# Patient Record
Sex: Female | Born: 1975 | Race: White | Hispanic: No | Marital: Married | Smoking: Never smoker
Health system: Southern US, Community
[De-identification: ages and names within clinical notes are randomized; demographics above are authoritative.]

## PROBLEM LIST (undated history)

## (undated) DIAGNOSIS — K8020 Calculus of gallbladder without cholecystitis without obstruction: Secondary | ICD-10-CM

## (undated) DIAGNOSIS — D649 Anemia, unspecified: Secondary | ICD-10-CM

## (undated) DIAGNOSIS — Z87442 Personal history of urinary calculi: Secondary | ICD-10-CM

## (undated) DIAGNOSIS — N8502 Endometrial intraepithelial neoplasia [EIN]: Secondary | ICD-10-CM

## (undated) DIAGNOSIS — R600 Localized edema: Secondary | ICD-10-CM

## (undated) DIAGNOSIS — F419 Anxiety disorder, unspecified: Secondary | ICD-10-CM

## (undated) DIAGNOSIS — M549 Dorsalgia, unspecified: Secondary | ICD-10-CM

## (undated) DIAGNOSIS — N390 Urinary tract infection, site not specified: Secondary | ICD-10-CM

## (undated) DIAGNOSIS — E669 Obesity, unspecified: Secondary | ICD-10-CM

## (undated) DIAGNOSIS — M2550 Pain in unspecified joint: Secondary | ICD-10-CM

## (undated) DIAGNOSIS — K219 Gastro-esophageal reflux disease without esophagitis: Secondary | ICD-10-CM

## (undated) DIAGNOSIS — F411 Generalized anxiety disorder: Secondary | ICD-10-CM

## (undated) DIAGNOSIS — F329 Major depressive disorder, single episode, unspecified: Secondary | ICD-10-CM

## (undated) DIAGNOSIS — I10 Essential (primary) hypertension: Secondary | ICD-10-CM

## (undated) DIAGNOSIS — G5600 Carpal tunnel syndrome, unspecified upper limb: Secondary | ICD-10-CM

## (undated) DIAGNOSIS — K5790 Diverticulosis of intestine, part unspecified, without perforation or abscess without bleeding: Secondary | ICD-10-CM

## (undated) DIAGNOSIS — F32A Depression, unspecified: Secondary | ICD-10-CM

## (undated) DIAGNOSIS — C541 Malignant neoplasm of endometrium: Secondary | ICD-10-CM

## (undated) DIAGNOSIS — G43909 Migraine, unspecified, not intractable, without status migrainosus: Secondary | ICD-10-CM

## (undated) DIAGNOSIS — K829 Disease of gallbladder, unspecified: Secondary | ICD-10-CM

## (undated) HISTORY — DX: Anemia, unspecified: D64.9

## (undated) HISTORY — DX: Anxiety disorder, unspecified: F41.9

## (undated) HISTORY — DX: Dorsalgia, unspecified: M54.9

## (undated) HISTORY — PX: CHOLECYSTECTOMY: SHX55

## (undated) HISTORY — DX: Malignant neoplasm of endometrium: C54.1

## (undated) HISTORY — DX: Urinary tract infection, site not specified: N39.0

## (undated) HISTORY — DX: Essential (primary) hypertension: I10

## (undated) HISTORY — DX: Pain in unspecified joint: M25.50

## (undated) HISTORY — DX: Migraine, unspecified, not intractable, without status migrainosus: G43.909

## (undated) HISTORY — DX: Disease of gallbladder, unspecified: K82.9

## (undated) HISTORY — PX: CARPAL TUNNEL RELEASE: SHX101

## (undated) HISTORY — DX: Calculus of gallbladder without cholecystitis without obstruction: K80.20

## (undated) HISTORY — DX: Carpal tunnel syndrome, unspecified upper limb: G56.00

## (undated) HISTORY — DX: Obesity, unspecified: E66.9

## (undated) HISTORY — DX: Localized edema: R60.0

---
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
## 2019-05-05 HISTORY — PX: ENDOMETRIAL BIOPSY: SHX622

## 2019-06-05 ENCOUNTER — Telehealth: Payer: Self-pay | Admitting: *Deleted

## 2019-06-05 NOTE — Telephone Encounter (Signed)
Patient called and scheduled an appt for new patient slot on 1/27

## 2019-06-08 NOTE — H&P (View-Only) (Signed)
GYNECOLOGIC ONCOLOGY NEW PATIENT CONSULTATION   Patient Name: Amber Cobb  Patient Age: 44 y.o. Date of Service: 06/10/19 Referring Provider: No referring provider defined for this encounter.   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    Patient desires to proceed with surgical management.  She has completed childbearing and is a suitable candidate for hysterectomy via a minimally invasive approach to surgery.  Given that she is premenopausal, bilateral salpingectomy is recommended.  Given her age and premenopausal status, we discussed the risk of ovarian removal.  As long as ovaries are normal-appearing at the time of surgery, I recommend leaving them in situ.  We reviewed that robotic assistance would be used to complete the surgery.  We discussed that endometrial cancer is detected in about 40% of final uterine pathology specimens from patients with CAH.    We discussed that the standard of care for [facility] is still total hysterectomy with intraoperative frozen pathologic review of the uterus to determine need for additional procedures such as lymph node evaluation.  I reviewed that I offer patients the option to proceed with sentinel lymph node biopsy in the event that a malignancy is diagnosed.  We discussed the increased risk associated with sentinel lymph node biopsy including damage to blood vessels and nerves.  The patient is agreeable to proceeding with plan for sentinel lymph node biopsy.  She understands that if there is failure to map on 1 or both sides, then we would plan to send the uterus for intraoperative frozen section to help determine the need for a full pelvic lymph node removal.  We reviewed the sentinel lymph node technique. Risks and benefits of  sentinel lymph node biopsy was reviewed. We reviewed the technique and ICG dye. The patient DOES NOT have an iodine allergy or known liver dysfunction. We reviewed the false negative rate (0.4%), and that 3% of patients with metastatic disease will not have it detected by SLN biopsy in endometrial cancer. A low risk of allergic reaction to the dye, <0.2% for ICG, has been reported. We also discussed that in the case of failed mapping, which occurs 40% of the time, a bilateral or unilateral lymphadenectomy will be performed at the surgeon's discretion.   Potential benefits of sentinel nodes including a higher detection rate for metastasis due to ultrastaging and potential reduction in operative morbidity. However, there remains uncertainty as to the role for treatment of micrometastatic disease. Further, the benefit of operative morbidity associated with the SLN technique in endometrial cancer is not yet completely known. In other patient populations (e.g. the cervical cancer population) there has been observed reductions in morbidity with SLN biopsy compared to pelvic lymphadenectomy. Lymphedema, nerve dysfunction and lymphocysts are all potential risks with the SLN technique as with complete lymphadenectomy. Additional risks to the patient include the risk of damage to an internal organ while operating in an altered view (e.g. the black and white image of the robotic fluorescence imaging mode).   The patient was consented for a robotic assisted hysterectomy, bilateral salpingectomy, sentinel lymph node evaluation, possible lymph node dissection, possible laparotomy. The risks of surgery were discussed in detail and she understands these to include infection; wound separation; hernia; vaginal cuff separation, injury to adjacent organs such as bowel, bladder, blood vessels, ureters and nerves; bleeding which may require  blood transfusion; anesthesia risk; thromboembolic events; possible death; unforeseen  complications; possible need for re-exploration; medical complications such as heart attack, stroke, pleural effusion and pneumonia; and, if full lymphadenectomy is performed the risk of lymphedema and lymphocyst. The patient will receive DVT and antibiotic prophylaxis as indicated. She voiced a clear understanding. She had the opportunity to ask questions. Perioperative instructions were reviewed with her. Prescriptions for post-op medications were sent to her pharmacy of choice.  A copy of this note was sent to the patient's referring provider.   52 minutes of total time was spent for this patient encounter, including preparation, face-to-face counseling with the patient and coordination of care, and documentation of the encounter.  Jeral Pinch, MD  Division of Gynecologic Oncology  Department of Obstetrics and Gynecology  [facility]  ___________________________________________  Chief Complaint: Chief Complaint  Patient presents with  . Complex atypical endometrial hyperplasia    History of Present Illness:  Amber Cobb is a 44 y.o. y.o. female who is seen in consultation at the request of No ref. provider found for an evaluation of EIN.  Patient reports a longstanding history of heavy and irregular menses.  She presented on 12/22 with reported abnormal uterine bleeding.  She had been seen previously in June 2020 with similar symptoms.  At that time her TSH and [facility] were normal.  Endometrial biopsy had been recommended as well as hormonal regulation, both declined.  Pelvic ultrasound at that time showed a uterus measuring 9.2 x 6.2 x 6.9 cm with endometrial lining measuring 22 mm.  Small, 3 cm complex cyst of the ovary also noted.  In December, patient underwent endometrial biopsy with uterus sounding to 8 cm and final pathology revealing at least complex atypical hyperplasia.  She notes that her menses several months ago lasted for 2 months.  Her most recent  menses stopped the day before her CT scan and she started bleeding again a week ago.  She endorses having a good appetite without nausea or vomiting.  She notes normal bowel and bladder function.  She denies any chest pain or shortness of breath at rest or with exertion.  She presents with her husband today.  She is scheduled for carpal tunnel surgery on 2/19.  PAST MEDICAL HISTORY:  Past Medical History:  Diagnosis Date  . Anemia   . Hypertension   . Migraines      PAST SURGICAL HISTORY:  Past Surgical History:  Procedure Laterality Date  . CHOLECYSTECTOMY    . ENDOMETRIAL BIOPSY  05/05/2019    OB/GYN HISTORY:  OB History  Gravida Para Term Preterm AB Living  2 2          SAB TAB Ectopic Multiple Live Births               # Outcome Date GA Lbr Len/2nd Weight Sex Delivery Anes PTL Lv  2 Para           1 Para             No LMP recorded.  Age at menarche: 41  Age at menopause: n/a Hx of HRT: no Hx of STDs: no Last pap: 10/2018 History of abnormal pap smears: no  SCREENING STUDIES:  Last mammogram: n/a  Last colonoscopy: n/a Last bone mineral density: n/a  MEDICATIONS: Outpatient Encounter Medications as of 06/10/2019  Medication Sig  . amLODipine (NORVASC) 5 MG tablet Take 5 mg by mouth daily.  . butalbital-acetaminophen-caffeine (FIORICET) 50-325-40 MG tablet Take  1 tablet by mouth every 6 (six) hours as needed.  Marland Kitchen escitalopram (LEXAPRO) 10 MG tablet Take 10 mg by mouth daily.  . FEROSUL 325 (65 Fe) MG tablet Take 325 mg by mouth daily.  Marland Kitchen loratadine (CLARITIN) 10 MG tablet Take 10 mg by mouth daily.  Marland Kitchen losartan (COZAAR) 100 MG tablet Take 100 mg by mouth daily.  Marland Kitchen omeprazole (PRILOSEC) 40 MG capsule Take 40 mg by mouth daily as needed.   No facility-administered encounter medications on file as of 06/10/2019.    ALLERGIES:  No Known Allergies   FAMILY HISTORY:  Family History  Problem Relation Age of Onset  . Hypertension Mother   . Diabetes Mother    . COPD Mother   . Thyroid disease Mother   . Thyroid disease Sister   . Thyroid disease Brother   . Throat cancer Maternal Grandfather   . Endometrial cancer Maternal Aunt      SOCIAL HISTORY:    Social Connections:   . Frequency of Communication with Friends and Family: Not on file  . Frequency of Social Gatherings with Friends and Family: Not on file  . Attends Religious Services: Not on file  . Active Member of Clubs or Organizations: Not on file  . Attends Archivist Meetings: Not on file  . Marital Status: Not on file    REVIEW OF SYSTEMS:  + vaginal bleeding Denies appetite changes, fevers, chills, fatigue, unexplained weight changes. Denies hearing loss, neck lumps or masses, mouth sores, ringing in ears or voice changes. Denies cough or wheezing.  Denies shortness of breath. Denies chest pain or palpitations. Denies leg swelling. Denies abdominal distention, pain, blood in stools, constipation, diarrhea, nausea, vomiting, or early satiety. Denies pain with intercourse, dysuria, frequency, hematuria or incontinence. Denies hot flashes, pelvic pain, vaginal discharge.   Denies joint pain, back pain or muscle pain/cramps. Denies itching, rash, or wounds. Denies dizziness, headaches, numbness or seizures. Denies swollen lymph nodes or glands, denies easy bruising or bleeding. Denies anxiety, depression, confusion, or decreased concentration.  Physical Exam:  Vital Signs for this encounter:  Blood pressure 119/86, pulse (!) 58, temperature 98 F (36.7 C), temperature source Temporal, resp. rate 16, height 5\' 6"  (1.676 m), weight 250 lb (113.4 kg), SpO2 100 %. Body mass index is 40.35 kg/m. General: Alert, oriented, no acute distress.  HEENT: Normocephalic, atraumatic. Sclera anicteric.  Chest: Clear to auscultation bilaterally. No wheezes, rhonchi, or rales. Cardiovascular: Regular rate and rhythm, no murmurs, rubs, or gallops.  Abdomen: Obese. Normoactive  bowel sounds. Soft, nondistended, nontender to palpation. No masses or hepatosplenomegaly appreciated. No palpable fluid wave.  Extremities: Grossly normal range of motion. Warm, well perfused. No edema bilaterally.  Skin: No rashes or lesions.  Lymphatics: No cervical, supraclavicular, or inguinal adenopathy.  GU:  Normal external female genitalia.  No lesions. No discharge or bleeding.             Bladder/urethra:  No lesions or masses, well supported bladder             Vagina: Well rugated vaginal mucosa.             Cervix: Normal appearing, no lesions.             Uterus: 8-10cm, mobile, no parametrial involvement or nodularity.             Adnexa: No masses.  Rectal: Deferred.  LABORATORY AND RADIOLOGIC DATA:  Outside medical records were reviewed to synthesize the above  history, along with the history and physical obtained during the visit.   Endometrial biopsy on 12/23: Complex atypical hyperplasia, at least.  Cannot exclude endometrial adenocarcinoma.  CT abdomen/pelvis on 05/21/2019: Endometrial thickening, compatible with reported history of endometrial cancer.  No evidence of metastatic disease in the abdomen or pelvis.  7 mm hypodensity in the uncinate process, indeterminate and likely benign.  MRI of the abdomen with and without contrast could be used to further evaluate.

## 2019-06-08 NOTE — Progress Notes (Signed)
GYNECOLOGIC ONCOLOGY NEW PATIENT CONSULTATION   Patient Name: Amber Cobb  Patient Age: 44 y.o. Date of Service: 06/10/19 Referring Provider: No referring provider defined for this encounter.   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    Patient desires to proceed with surgical management.  She has completed childbearing and is a suitable candidate for hysterectomy via a minimally invasive approach to surgery.  Given that she is premenopausal, bilateral salpingectomy is recommended.  Given her age and premenopausal status, we discussed the risk of ovarian removal.  As long as ovaries are normal-appearing at the time of surgery, I recommend leaving them in situ.  We reviewed that robotic assistance would be used to complete the surgery.  We discussed that endometrial cancer is detected in about 40% of final uterine pathology specimens from patients with CAH.    We discussed that the standard of care for [facility] is still total hysterectomy with intraoperative frozen pathologic review of the uterus to determine need for additional procedures such as lymph node evaluation.  I reviewed that I offer patients the option to proceed with sentinel lymph node biopsy in the event that a malignancy is diagnosed.  We discussed the increased risk associated with sentinel lymph node biopsy including damage to blood vessels and nerves.  The patient is agreeable to proceeding with plan for sentinel lymph node biopsy.  She understands that if there is failure to map on 1 or both sides, then we would plan to send the uterus for intraoperative frozen section to help determine the need for a full pelvic lymph node removal.  We reviewed the sentinel lymph node technique. Risks and benefits of  sentinel lymph node biopsy was reviewed. We reviewed the technique and ICG dye. The patient DOES NOT have an iodine allergy or known liver dysfunction. We reviewed the false negative rate (0.4%), and that 3% of patients with metastatic disease will not have it detected by SLN biopsy in endometrial cancer. A low risk of allergic reaction to the dye, <0.2% for ICG, has been reported. We also discussed that in the case of failed mapping, which occurs 40% of the time, a bilateral or unilateral lymphadenectomy will be performed at the surgeon's discretion.   Potential benefits of sentinel nodes including a higher detection rate for metastasis due to ultrastaging and potential reduction in operative morbidity. However, there remains uncertainty as to the role for treatment of micrometastatic disease. Further, the benefit of operative morbidity associated with the SLN technique in endometrial cancer is not yet completely known. In other patient populations (e.g. the cervical cancer population) there has been observed reductions in morbidity with SLN biopsy compared to pelvic lymphadenectomy. Lymphedema, nerve dysfunction and lymphocysts are all potential risks with the SLN technique as with complete lymphadenectomy. Additional risks to the patient include the risk of damage to an internal organ while operating in an altered view (e.g. the black and white image of the robotic fluorescence imaging mode).   The patient was consented for a robotic assisted hysterectomy, bilateral salpingectomy, sentinel lymph node evaluation, possible lymph node dissection, possible laparotomy. The risks of surgery were discussed in detail and she understands these to include infection; wound separation; hernia; vaginal cuff separation, injury to adjacent organs such as bowel, bladder, blood vessels, ureters and nerves; bleeding which may require  blood transfusion; anesthesia risk; thromboembolic events; possible death; unforeseen  complications; possible need for re-exploration; medical complications such as heart attack, stroke, pleural effusion and pneumonia; and, if full lymphadenectomy is performed the risk of lymphedema and lymphocyst. The patient will receive DVT and antibiotic prophylaxis as indicated. She voiced a clear understanding. She had the opportunity to ask questions. Perioperative instructions were reviewed with her. Prescriptions for post-op medications were sent to her pharmacy of choice.  A copy of this note was sent to the patient's referring provider.   52 minutes of total time was spent for this patient encounter, including preparation, face-to-face counseling with the patient and coordination of care, and documentation of the encounter.  Jeral Pinch, MD  Division of Gynecologic Oncology  Department of Obstetrics and Gynecology  [facility]  ___________________________________________  Chief Complaint: Chief Complaint  Patient presents with  . Complex atypical endometrial hyperplasia    History of Present Illness:  Amber Cobb is a 45 y.o. y.o. female who is seen in consultation at the request of No ref. provider found for an evaluation of EIN.  Patient reports a longstanding history of heavy and irregular menses.  She presented on 12/22 with reported abnormal uterine bleeding.  She had been seen previously in June 2020 with similar symptoms.  At that time her TSH and [facility] were normal.  Endometrial biopsy had been recommended as well as hormonal regulation, both declined.  Pelvic ultrasound at that time showed a uterus measuring 9.2 x 6.2 x 6.9 cm with endometrial lining measuring 22 mm.  Small, 3 cm complex cyst of the ovary also noted.  In December, patient underwent endometrial biopsy with uterus sounding to 8 cm and final pathology revealing at least complex atypical hyperplasia.  She notes that her menses several months ago lasted for 2 months.  Her most recent  menses stopped the day before her CT scan and she started bleeding again a week ago.  She endorses having a good appetite without nausea or vomiting.  She notes normal bowel and bladder function.  She denies any chest pain or shortness of breath at rest or with exertion.  She presents with her husband today.  She is scheduled for carpal tunnel surgery on 2/19.  PAST MEDICAL HISTORY:  Past Medical History:  Diagnosis Date  . Anemia   . Hypertension   . Migraines      PAST SURGICAL HISTORY:  Past Surgical History:  Procedure Laterality Date  . CHOLECYSTECTOMY    . ENDOMETRIAL BIOPSY  05/05/2019    OB/GYN HISTORY:  OB History  Gravida Para Term Preterm AB Living  2 2          SAB TAB Ectopic Multiple Live Births               # Outcome Date GA Lbr Len/2nd Weight Sex Delivery Anes PTL Lv  2 Para           1 Para             No LMP recorded.  Age at menarche: 43  Age at menopause: n/a Hx of HRT: no Hx of STDs: no Last pap: 10/2018 History of abnormal pap smears: no  SCREENING STUDIES:  Last mammogram: n/a  Last colonoscopy: n/a Last bone mineral density: n/a  MEDICATIONS: Outpatient Encounter Medications as of 06/10/2019  Medication Sig  . amLODipine (NORVASC) 5 MG tablet Take 5 mg by mouth daily.  . butalbital-acetaminophen-caffeine (FIORICET) 50-325-40 MG tablet Take  1 tablet by mouth every 6 (six) hours as needed.  Marland Kitchen escitalopram (LEXAPRO) 10 MG tablet Take 10 mg by mouth daily.  . FEROSUL 325 (65 Fe) MG tablet Take 325 mg by mouth daily.  Marland Kitchen loratadine (CLARITIN) 10 MG tablet Take 10 mg by mouth daily.  Marland Kitchen losartan (COZAAR) 100 MG tablet Take 100 mg by mouth daily.  Marland Kitchen omeprazole (PRILOSEC) 40 MG capsule Take 40 mg by mouth daily as needed.   No facility-administered encounter medications on file as of 06/10/2019.    ALLERGIES:  No Known Allergies   FAMILY HISTORY:  Family History  Problem Relation Age of Onset  . Hypertension Mother   . Diabetes Mother    . COPD Mother   . Thyroid disease Mother   . Thyroid disease Sister   . Thyroid disease Brother   . Throat cancer Maternal Grandfather   . Endometrial cancer Maternal Aunt      SOCIAL HISTORY:    Social Connections:   . Frequency of Communication with Friends and Family: Not on file  . Frequency of Social Gatherings with Friends and Family: Not on file  . Attends Religious Services: Not on file  . Active Member of Clubs or Organizations: Not on file  . Attends Archivist Meetings: Not on file  . Marital Status: Not on file    REVIEW OF SYSTEMS:  + vaginal bleeding Denies appetite changes, fevers, chills, fatigue, unexplained weight changes. Denies hearing loss, neck lumps or masses, mouth sores, ringing in ears or voice changes. Denies cough or wheezing.  Denies shortness of breath. Denies chest pain or palpitations. Denies leg swelling. Denies abdominal distention, pain, blood in stools, constipation, diarrhea, nausea, vomiting, or early satiety. Denies pain with intercourse, dysuria, frequency, hematuria or incontinence. Denies hot flashes, pelvic pain, vaginal discharge.   Denies joint pain, back pain or muscle pain/cramps. Denies itching, rash, or wounds. Denies dizziness, headaches, numbness or seizures. Denies swollen lymph nodes or glands, denies easy bruising or bleeding. Denies anxiety, depression, confusion, or decreased concentration.  Physical Exam:  Vital Signs for this encounter:  Blood pressure 119/86, pulse (!) 58, temperature 98 F (36.7 C), temperature source Temporal, resp. rate 16, height 5\' 6"  (1.676 m), weight 250 lb (113.4 kg), SpO2 100 %. Body mass index is 40.35 kg/m. General: Alert, oriented, no acute distress.  HEENT: Normocephalic, atraumatic. Sclera anicteric.  Chest: Clear to auscultation bilaterally. No wheezes, rhonchi, or rales. Cardiovascular: Regular rate and rhythm, no murmurs, rubs, or gallops.  Abdomen: Obese. Normoactive  bowel sounds. Soft, nondistended, nontender to palpation. No masses or hepatosplenomegaly appreciated. No palpable fluid wave.  Extremities: Grossly normal range of motion. Warm, well perfused. No edema bilaterally.  Skin: No rashes or lesions.  Lymphatics: No cervical, supraclavicular, or inguinal adenopathy.  GU:  Normal external female genitalia.  No lesions. No discharge or bleeding.             Bladder/urethra:  No lesions or masses, well supported bladder             Vagina: Well rugated vaginal mucosa.             Cervix: Normal appearing, no lesions.             Uterus: 8-10cm, mobile, no parametrial involvement or nodularity.             Adnexa: No masses.  Rectal: Deferred.  LABORATORY AND RADIOLOGIC DATA:  Outside medical records were reviewed to synthesize the above  history, along with the history and physical obtained during the visit.   Endometrial biopsy on 12/23: Complex atypical hyperplasia, at least.  Cannot exclude endometrial adenocarcinoma.  CT abdomen/pelvis on 05/21/2019: Endometrial thickening, compatible with reported history of endometrial cancer.  No evidence of metastatic disease in the abdomen or pelvis.  7 mm hypodensity in the uncinate process, indeterminate and likely benign.  MRI of the abdomen with and without contrast could be used to further evaluate.

## 2019-06-09 ENCOUNTER — Encounter: Payer: Self-pay | Admitting: *Deleted

## 2019-06-10 ENCOUNTER — Encounter: Payer: Self-pay | Admitting: Gynecologic Oncology

## 2019-06-10 ENCOUNTER — Inpatient Hospital Stay: Payer: BC Managed Care – PPO | Attending: Gynecologic Oncology | Admitting: Gynecologic Oncology

## 2019-06-10 ENCOUNTER — Other Ambulatory Visit: Payer: Self-pay

## 2019-06-10 ENCOUNTER — Other Ambulatory Visit: Payer: Self-pay | Admitting: Gynecologic Oncology

## 2019-06-10 ENCOUNTER — Telehealth: Payer: Self-pay

## 2019-06-10 VITALS — BP 119/86 | HR 58 | Temp 98.0°F | Resp 16 | Ht 66.0 in | Wt 250.0 lb

## 2019-06-10 DIAGNOSIS — N8502 Endometrial intraepithelial neoplasia [EIN]: Secondary | ICD-10-CM | POA: Diagnosis present

## 2019-06-10 MED ORDER — SENNOSIDES-DOCUSATE SODIUM 8.6-50 MG PO TABS: 2.0000 | ORAL_TABLET | Freq: Every day | ORAL | 1 refills | Status: DC

## 2019-06-10 MED ORDER — IBUPROFEN 800 MG PO TABS: 800.0000 mg | ORAL_TABLET | Freq: Three times a day (TID) | ORAL | 0 refills | Status: DC | PRN

## 2019-06-10 MED ORDER — OXYCODONE HCL 5 MG PO TABS: 5.0000 mg | ORAL_TABLET | ORAL | 0 refills | Status: DC | PRN

## 2019-06-10 NOTE — Telephone Encounter (Signed)
Pt will begin leave on 06-15-19 for Covid quarantine. Surgery is 06-17-19. She will return to work on Monday 08-10-19. Information forwarded to Joylene John, NP.

## 2019-06-10 NOTE — Patient Instructions (Signed)
Preparing for your Surgery  Plan for surgery on June 17, 2019 with Dr. Jeral Pinch at [facility]. You will be scheduled for a robotic assisted total laparoscopic hysterectomy, bilateral salpingectomy, sentinel lymph node biopsy.   Pre-operative Testing -You will receive a phone call from presurgical testing at [facility] to discuss pre-surgery instructions and to arrange for your COVID test before surgery.  -Bring your insurance card, copy of an advanced directive if applicable, medication list  -At that visit, you will be asked to sign a consent for a possible blood transfusion in case a transfusion becomes necessary during surgery.  The need for a blood transfusion is rare but having consent is a necessary part of your care.     -You should not be taking blood thinners or aspirin at least ten days prior to surgery unless instructed by your surgeon.  -Do not take supplements such as fish oil (omega 3), red yeast rice, tumeric before your surgery.   Day Before Surgery at [facility] will be asked to take in a light diet the day before surgery.  Avoid carbonated beverages.  You will be advised to have nothing to eat or drink after midnight the evening before.    Eat a light diet the day before surgery.  Examples including soups, broths, toast, yogurt, mashed potatoes.  Things to avoid include carbonated beverages (fizzy beverages), raw fruits and raw vegetables, or beans.   If your bowels are filled with gas, your surgeon will have difficulty visualizing your pelvic organs which increases your surgical risks.  Your role in recovery Your role is to become active as soon as directed by your doctor, while still giving yourself time to heal.  Rest when you feel tired. You will be asked to do the following in order to speed your recovery:  - Cough and breathe deeply. This helps to clear and expand your lungs and can prevent pneumonia after surgery.   - [place]. Do mild physical activity. Walking or moving your legs help your circulation and body functions return to normal. Do not try to get up or walk alone the first time after surgery.   -If you develop swelling on one leg or the other, pain in the back of your leg, redness/warmth in one of your legs, please call the office or go to the Emergency Room to have a doppler to rule out a blood clot. For shortness of breath, chest pain-seek care in the Emergency Room as soon as possible. - Actively manage your pain. Managing your pain lets you move in comfort. We will ask you to rate your pain on a scale of zero to 10. It is your responsibility to tell your doctor or nurse where and how much you hurt so your pain can be treated.  Special Considerations -If you are diabetic, you may be placed on insulin after surgery to have closer control over your blood sugars to promote healing and recovery.  This does not mean that you will be discharged on insulin.  If applicable, your oral antidiabetics will be resumed when you are tolerating a solid diet.  -Your final pathology results from surgery should be available around one week after surgery and the results will be relayed to you when available.  -FMLA forms can be faxed to [phone] and please allow 5-7 business days for completion.  Pain Management After Surgery -You have been prescribed your pain medication and bowel regimen  medications before surgery so that you can have these available when you are discharged from the hospital. The pain medication is for use ONLY AFTER surgery and a new prescription will not be given.   -Make sure that you have Tylenol and Ibuprofen at home to use on a regular basis after surgery for pain control. We recommend alternating the medications every hour to six hours since they work differently and are processed in the body differently for pain relief.  -Review the attached handout on narcotic use  and their risks and side effects.   Bowel Regimen -You have been prescribed Sennakot-S to take nightly to prevent constipation especially if you are taking the narcotic pain medication intermittently.  It is important to prevent constipation and drink adequate amounts of liquids. You can stop taking this medication when you are not taking pain medication and you are back on your normal bowel routine.   Blood Transfusion Information (For the consent to be signed before surgery)  We will be checking your blood type before surgery so in case of emergencies, we will know what type of blood you would need.                                            WHAT IS A BLOOD TRANSFUSION?  A transfusion is the replacement of blood or some of its parts. Blood is made up of multiple cells which provide different functions.  Red blood cells carry oxygen and are used for blood loss replacement.  White blood cells fight against infection.  Platelets control bleeding.  Plasma helps clot blood.  Other blood products are available for specialized needs, such as hemophilia or other clotting disorders. BEFORE THE TRANSFUSION  Who gives blood for transfusions?   You may be able to donate blood to be used at a later date on yourself (autologous donation).  Relatives can be asked to donate blood. This is generally not any safer than if you have received blood from a stranger. The same precautions are taken to ensure safety when a relative's blood is donated.  Healthy volunteers who are fully evaluated to make sure their blood is safe. This is blood bank blood. Transfusion therapy is the safest it has ever been in the practice of medicine. Before blood is taken from a donor, a complete history is taken to make sure that person has no history of diseases nor engages in risky social behavior (examples are intravenous drug use or sexual activity with multiple partners). The donor's travel history is screened to  minimize risk of transmitting infections, such as malaria. The donated blood is tested for signs of infectious diseases, such as HIV and hepatitis. The blood is then tested to be sure it is compatible with you in order to minimize the chance of a transfusion reaction. If you or a relative donates blood, this is often done in anticipation of surgery and is not appropriate for emergency situations. It takes many days to process the donated blood. RISKS AND COMPLICATIONS Although transfusion therapy is very safe and saves many lives, the main dangers of transfusion include:   Getting an infectious disease.  Developing a transfusion reaction. This is an allergic reaction to something in the blood you were given. Every precaution is taken to prevent this. The decision to have a blood transfusion has been considered carefully by your caregiver before blood  is given. Blood is not given unless the benefits outweigh the risks.  AFTER SURGERY INSTRUCTIONS  Return to work: 4-6 weeks if applicable  Activity: 1. Be up and out of the bed during the day.  Take a nap if needed.  You may walk up steps but be careful and use the hand rail.  Stair climbing will tire you more than you think, you may need to stop part way and rest.   2. No lifting or straining for 6 weeks over 10 pounds. No pushing, pulling, straining for 6 weeks.  3. No driving for 1 week(s).  Do not drive if you are taking narcotic pain medicine.   4. You can shower as soon as the next day after surgery. Shower daily.  Use soap and water on your incision and pat dry; don't rub.  No tub baths or submerging your body in water until cleared by your surgeon. If you have the soap that was given to you by pre-surgical testing that was used before surgery, you do not need to use it afterwards because this can irritate your incisions.   5. No sexual activity and nothing in the vagina for 8 weeks.  6. You may experience a small amount of clear drainage  from your incisions, which is normal.  If the drainage persists, increases, or changes color please call the office.  7. Do not use creams, lotions, or ointments such as neosporin on your incisions after surgery until advised by your surgeon because they can cause removal of the dermabond glue on your incisions.    8. You may experience vaginal spotting after surgery or around the 6-8 week mark from surgery when the stitches at the top of the vagina begin to dissolve.  The spotting is normal but if you experience heavy bleeding, call our office.  9. Take Tylenol or ibuprofen first for pain and only use narcotic pain medication for severe pain not relieved by the Tylenol or Ibuprofen.  Monitor your Tylenol intake to a max of 4,000 mg.  Diet: 1. Low sodium Heart Healthy Diet is recommended.  2. It is safe to use a laxative, such as Miralax or Colace, if you have difficulty moving your bowels. You can take Sennakot at bedtime every evening to keep bowel movements regular and to prevent constipation.    Wound Care: 1. Keep clean and dry.  Shower daily.  Reasons to call the Doctor:  Fever - Oral temperature greater than 100.4 degrees Fahrenheit  Foul-smelling vaginal discharge  Difficulty urinating  Nausea and vomiting  Increased pain at the site of the incision that is unrelieved with pain medicine.  Difficulty breathing with or without chest pain  New calf pain especially if only on one side  Sudden, continuing increased vaginal bleeding with or without clots.   Contacts: For questions or concerns you should contact:  Dr. Jeral Pinch at [phone]  Joylene John, NP at [phone]  After Hours: call [phone] and have the GYN Oncologist paged/contacted

## 2019-06-11 NOTE — Telephone Encounter (Signed)
Told [name] that Amber Cobb said that usually a robotic surgery pt's are out for 4-6 weeks. [name],NP will write her out for 6 weeks = return to work on 07-30-19. Pt verbalized understanding.

## 2019-06-12 ENCOUNTER — Encounter (HOSPITAL_COMMUNITY): Payer: Self-pay

## 2019-06-12 NOTE — Patient Instructions (Signed)
DUE TO COVID-19 ONLY ONE VISITOR IS ALLOWED IN WAITING ROOM (VISITOR WILL HAVE A TEMPERATURE CHECK ON ARRIVAL AND MUST WEAR A FACE MASK THE ENTIRE TIME.)  ONCE YOU ARE ADMITTED TO YOUR PRIVATE ROOM, THE SAME ONE VISITOR IS ALLOWED TO VISIT DURING VISITING HOURS ONLY.  Your COVID swab testing is scheduled for Jan. 30, 2021  at 10:10 AM , You must self quarantine after your testing per handout given to you at the testing site.  ([facility] up testing enter pre-surgical testing line)    Your procedure is scheduled on: Wednesday, Feb. 3, 2021  Report to [facility]   Call this number if you have problems the morning of surgery:  [phone].   OUR ADDRESS IS [place].  WE ARE LOCATED IN THE [place]                                   [address] [address].                                     REMEMBER:  DO NOT EAT FOOD AFTER MIDNIGHT .    MAY HAVE LIQUIDS UNTIL 5:30 AM DAY OF SURGERY  CLEAR LIQUID DIET  Foods Allowed                                                                     Foods Excluded  Water, Black Coffee and tea, regular and decaf                             liquids that you cannot  Plain Jell-O in any flavor  (No red)                                           see through such as: Fruit ices (not with fruit pulp)                                     milk, soups, orange juice  Iced Popsicles (No red)                                    All solid food Carbonated beverages, regular and diet                                    Apple juices Sports drinks like Gatorade (No red) Lightly seasoned clear broth or consume(fat free) Sugar, honey syrup  Sample Menu Breakfast  Lunch                                     Supper Cranberry juice                    Beef broth                            Chicken broth Jell-O                                     Grape juice                            Apple juice Coffee or tea                        Jell-O                                      Popsicle                                                Coffee or tea                        Coffee or tea  BRUSH YOUR TEETH THE MORNING OF SURGERY.  TAKE THESE MEDICATIONS MORNING OF SURGERY WITH A SIP OF WATER: AMLODIPINE, ESCITALOPRAM, LORATADINE   DO NOT WEAR JEWERLY, MAKE UP, OR NAIL POLISH.  DO NOT WEAR LOTIONS, POWDERS, PERFUMES/COLOGNE OR DEODORANT.  DO NOT SHAVE FOR 24 HOURS PRIOR TO DAY OF SURGERY.  CONTACTS, GLASSES, OR DENTURES MAY NOT BE WORN TO SURGERY.                                    [facility] IS NOT RESPONSIBLE  FOR ANY BELONGINGS.                                                                     [facility] - Preparing for Surgery Before surgery, you can play an important role.  Because skin is not sterile, your skin needs to be as free of germs as possible.  You can reduce the number of germs on your skin by washing with CHG (chlorahexidine gluconate) soap before surgery.  CHG is an antiseptic cleaner which kills germs and bonds with the skin to continue killing germs even after washing. Please DO NOT use if you have an allergy to CHG or antibacterial soaps.  If your skin becomes reddened/irritated stop using the CHG and inform your nurse when you arrive at Short Stay. Do not shave (including legs and underarms) for at least 48 hours prior to the first CHG  shower.  You may shave your face/neck.  Please follow these instructions carefully:  1.  Shower with CHG Soap the night before surgery and the  morning of surgery.  2.  If you choose to wash your hair, wash your hair first as usual with your normal  shampoo.  3.  After you shampoo, rinse your hair and body thoroughly to remove the shampoo.                             4.  Use CHG as you would any other liquid soap.  You can apply chg directly to the skin and wash.  Gently with a scrungie or clean  washcloth.  5.  Apply the CHG Soap to your body ONLY FROM THE NECK DOWN.   Do   not use on face/ open                           Wound or open sores. Avoid contact with eyes, ears mouth and   genitals (private parts).                       Wash face,  Genitals (private parts) with your normal soap.             6.  Wash thoroughly, paying special attention to the area where your    surgery  will be performed.  7.  Thoroughly rinse your body with warm water from the neck down.  8.  DO NOT shower/wash with your normal soap after using and rinsing off the CHG Soap.                9.  Pat yourself dry with a clean towel.            10.  Wear clean pajamas.            11.  Place clean sheets on your bed the night of your first shower and do not  sleep with pets. Day of Surgery : Do not apply any lotions/deodorants the morning of surgery.  Please wear clean clothes to the hospital/surgery center.  FAILURE TO FOLLOW THESE INSTRUCTIONS MAY RESULT IN THE CANCELLATION OF YOUR SURGERY  PATIENT SIGNATURE_________________________________  NURSE SIGNATURE__________________________________  ________________________________________________________________________

## 2019-06-13 ENCOUNTER — Other Ambulatory Visit (HOSPITAL_COMMUNITY)
Admission: RE | Admit: 2019-06-13 | Discharge: 2019-06-13 | Disposition: A | Discharge status: Home / Self Care | Payer: BC Managed Care – PPO | Source: Ambulatory Visit | Attending: Gynecologic Oncology | Admitting: Gynecologic Oncology

## 2019-06-13 DIAGNOSIS — Z20822 Contact with and (suspected) exposure to covid-19: Secondary | ICD-10-CM | POA: Insufficient documentation

## 2019-06-13 DIAGNOSIS — Z01812 Encounter for preprocedural laboratory examination: Secondary | ICD-10-CM | POA: Insufficient documentation

## 2019-06-13 LAB — SARS CORONAVIRUS 2 (TAT 6-24 HRS): SARS Coronavirus 2: NEGATIVE

## 2019-06-15 ENCOUNTER — Other Ambulatory Visit: Payer: Self-pay

## 2019-06-15 ENCOUNTER — Encounter (HOSPITAL_COMMUNITY)
Admission: RE | Admit: 2019-06-15 | Discharge: 2019-06-15 | Disposition: A | Discharge status: Home / Self Care | Payer: BC Managed Care – PPO | Source: Ambulatory Visit | Attending: Gynecologic Oncology | Admitting: Gynecologic Oncology

## 2019-06-15 ENCOUNTER — Encounter (HOSPITAL_COMMUNITY): Payer: Self-pay | Admitting: *Deleted

## 2019-06-15 DIAGNOSIS — Z6838 Body mass index (BMI) 38.0-38.9, adult: Secondary | ICD-10-CM | POA: Diagnosis not present

## 2019-06-15 DIAGNOSIS — N8502 Endometrial intraepithelial neoplasia [EIN]: Secondary | ICD-10-CM | POA: Diagnosis present

## 2019-06-15 DIAGNOSIS — F329 Major depressive disorder, single episode, unspecified: Secondary | ICD-10-CM | POA: Diagnosis not present

## 2019-06-15 DIAGNOSIS — I10 Essential (primary) hypertension: Secondary | ICD-10-CM | POA: Diagnosis not present

## 2019-06-15 DIAGNOSIS — Z8249 Family history of ischemic heart disease and other diseases of the circulatory system: Secondary | ICD-10-CM | POA: Diagnosis not present

## 2019-06-15 DIAGNOSIS — F419 Anxiety disorder, unspecified: Secondary | ICD-10-CM | POA: Diagnosis not present

## 2019-06-15 DIAGNOSIS — Z01818 Encounter for other preprocedural examination: Secondary | ICD-10-CM | POA: Diagnosis present

## 2019-06-15 DIAGNOSIS — K219 Gastro-esophageal reflux disease without esophagitis: Secondary | ICD-10-CM | POA: Diagnosis not present

## 2019-06-15 DIAGNOSIS — D649 Anemia, unspecified: Secondary | ICD-10-CM | POA: Diagnosis not present

## 2019-06-15 DIAGNOSIS — E669 Obesity, unspecified: Secondary | ICD-10-CM | POA: Diagnosis not present

## 2019-06-15 DIAGNOSIS — C541 Malignant neoplasm of endometrium: Secondary | ICD-10-CM | POA: Diagnosis not present

## 2019-06-15 DIAGNOSIS — Z79899 Other long term (current) drug therapy: Secondary | ICD-10-CM | POA: Diagnosis not present

## 2019-06-15 HISTORY — DX: Personal history of urinary calculi: Z87.442

## 2019-06-15 HISTORY — DX: Endometrial intraepithelial neoplasia (EIN): N85.02

## 2019-06-15 HISTORY — DX: Gastro-esophageal reflux disease without esophagitis: K21.9

## 2019-06-15 HISTORY — DX: Generalized anxiety disorder: F41.1

## 2019-06-15 HISTORY — DX: Depression, unspecified: F32.A

## 2019-06-15 HISTORY — DX: Diverticulosis of intestine, part unspecified, without perforation or abscess without bleeding: K57.90

## 2019-06-15 LAB — COMPREHENSIVE METABOLIC PANEL
ALT: 16 U/L (ref 0–44)
AST: 16 U/L (ref 15–41)
Albumin: 3.8 g/dL (ref 3.5–5.0)
Alkaline Phosphatase: 66 U/L (ref 38–126)
Anion gap: 8 (ref 5–15)
BUN: 19 mg/dL (ref 6–20)
CO2: 27 mmol/L (ref 22–32)
Calcium: 8.9 mg/dL (ref 8.9–10.3)
Chloride: 104 mmol/L (ref 98–111)
Creatinine, Ser: 0.77 mg/dL (ref 0.44–1.00)
GFR calc Af Amer: >60 mL/min (ref >60)
GFR calc non Af Amer: >60 mL/min (ref >60)
Glucose, Bld: 137 mg/dL — ABNORMAL HIGH (ref 70–99)
Potassium: 3.5 mmol/L (ref 3.5–5.1)
Sodium: 139 mmol/L (ref 135–145)
Total Bilirubin: 0.6 mg/dL (ref 0.3–1.2)
Total Protein: 7.6 g/dL (ref 6.5–8.1)

## 2019-06-15 LAB — CBC
HCT: 41.3 % (ref 36.0–46.0)
Hemoglobin: 13.4 g/dL (ref 12.0–15.0)
MCH: 30.5 pg (ref 26.0–34.0)
MCHC: 32.4 g/dL (ref 30.0–36.0)
MCV: 93.9 fL (ref 80.0–100.0)
Platelets: 405 10*3/uL — ABNORMAL HIGH (ref 150–400)
RBC: 4.4 MIL/uL (ref 3.87–5.11)
RDW: 12.7 % (ref 11.5–15.5)
WBC: 7.9 10*3/uL (ref 4.0–10.5)
nRBC: 0 % (ref 0.0–0.2)

## 2019-06-15 LAB — URINALYSIS, ROUTINE W REFLEX MICROSCOPIC
Bilirubin Urine: NEGATIVE
Glucose, UA: NEGATIVE mg/dL
Ketones, ur: NEGATIVE mg/dL
Nitrite: NEGATIVE
Protein, ur: NEGATIVE mg/dL
RBC / HPF: >50 RBC/hpf — ABNORMAL HIGH (ref 0–5)
Specific Gravity, Urine: 1.018 (ref 1.005–1.030)
pH: 5 (ref 5.0–8.0)

## 2019-06-15 LAB — ABO/RH: ABO/RH(D): O POS

## 2019-06-15 NOTE — Progress Notes (Signed)
PCP - Dr. Benito Mccreedy Cardiologist - none  Chest x-ray - no EKG - 06/15/19 Stress Test -no  ECHO - no Cardiac Cath - no  Sleep Study - no CPAP - no  Fasting Blood Sugar - NA Checks Blood Sugar _____ times a day  Blood Thinner Instructions:NA Aspirin Instructions: Last Dose:  Anesthesia review:   Patient denies shortness of breath, fever, cough and chest pain at PAT appointment  yes Patient verbalized understanding of instructions that were given to them at the PAT appointment. Patient was also instructed that they will need to review over the PAT instructions again at home before surgery. yes

## 2019-06-16 ENCOUNTER — Telehealth: Payer: Self-pay

## 2019-06-16 LAB — URINE CULTURE

## 2019-06-16 NOTE — Telephone Encounter (Signed)
Amber Cobb understands the pre-op instructions and has no questions or concerns at this time.

## 2019-06-16 NOTE — Anesthesia Preprocedure Evaluation (Addendum)
Anesthesia Evaluation  Patient identified by MRN, date of birth, ID band Patient awake    Reviewed: Allergy & Precautions, NPO status , Patient's Chart, lab work & pertinent test results  Airway Mallampati: II  TM Distance: >3 FB Neck ROM: Full    Dental  (+) Teeth Intact, Dental Advisory Given   Pulmonary neg pulmonary ROS,    Pulmonary exam normal breath sounds clear to auscultation       Cardiovascular hypertension, Pt. on medications Normal cardiovascular exam Rhythm:Regular Rate:Normal     Neuro/Psych  Headaches, PSYCHIATRIC DISORDERS Anxiety Depression  Neuromuscular disease    GI/Hepatic Neg liver ROS, GERD  Medicated,  Endo/Other  Obesity   Renal/GU negative Renal ROS     Musculoskeletal negative musculoskeletal ROS (+)   Abdominal   Peds  Hematology negative hematology ROS (+)   Anesthesia Other Findings Day of surgery medications reviewed with the patient.  Reproductive/Obstetrics Complex atypical endometrial hyperplasia                            Anesthesia Physical Anesthesia Plan  ASA: III  Anesthesia Plan: General   Post-op Pain Management:    Induction: Intravenous  PONV Risk Score and Plan: 4 or greater and Diphenhydramine, Scopolamine patch - Pre-op, Midazolam, Dexamethasone and Ondansetron  Airway Management Planned: Oral ETT  Additional Equipment:   Intra-op Plan:   Post-operative Plan: Extubation in OR  Informed Consent: I have reviewed the patients History and Physical, chart, labs and discussed the procedure including the risks, benefits and alternatives for the proposed anesthesia with the patient or authorized representative who has indicated his/her understanding and acceptance.     Dental advisory given  Plan Discussed with: CRNA  Anesthesia Plan Comments:        Anesthesia Quick Evaluation

## 2019-06-17 ENCOUNTER — Encounter (HOSPITAL_BASED_OUTPATIENT_CLINIC_OR_DEPARTMENT_OTHER)
Admission: RE | Disposition: A | Discharge status: Home / Self Care | Payer: Self-pay | Source: Home / Self Care | Attending: Gynecologic Oncology

## 2019-06-17 ENCOUNTER — Other Ambulatory Visit: Payer: Self-pay

## 2019-06-17 ENCOUNTER — Encounter (HOSPITAL_BASED_OUTPATIENT_CLINIC_OR_DEPARTMENT_OTHER): Payer: Self-pay | Admitting: Gynecologic Oncology

## 2019-06-17 ENCOUNTER — Ambulatory Visit (HOSPITAL_BASED_OUTPATIENT_CLINIC_OR_DEPARTMENT_OTHER)
Admission: RE | Admit: 2019-06-17 | Discharge: 2019-06-17 | Disposition: A | Discharge status: Home / Self Care | Payer: BC Managed Care – PPO | Attending: Gynecologic Oncology | Admitting: Gynecologic Oncology

## 2019-06-17 ENCOUNTER — Ambulatory Visit (HOSPITAL_BASED_OUTPATIENT_CLINIC_OR_DEPARTMENT_OTHER): Payer: BC Managed Care – PPO | Admitting: Anesthesiology

## 2019-06-17 ENCOUNTER — Ambulatory Visit (HOSPITAL_BASED_OUTPATIENT_CLINIC_OR_DEPARTMENT_OTHER): Payer: BC Managed Care – PPO | Admitting: Physician Assistant

## 2019-06-17 DIAGNOSIS — N8502 Endometrial intraepithelial neoplasia [EIN]: Secondary | ICD-10-CM | POA: Diagnosis not present

## 2019-06-17 DIAGNOSIS — F419 Anxiety disorder, unspecified: Secondary | ICD-10-CM | POA: Insufficient documentation

## 2019-06-17 DIAGNOSIS — E669 Obesity, unspecified: Secondary | ICD-10-CM | POA: Insufficient documentation

## 2019-06-17 DIAGNOSIS — D649 Anemia, unspecified: Secondary | ICD-10-CM | POA: Insufficient documentation

## 2019-06-17 DIAGNOSIS — F329 Major depressive disorder, single episode, unspecified: Secondary | ICD-10-CM | POA: Insufficient documentation

## 2019-06-17 DIAGNOSIS — Z8249 Family history of ischemic heart disease and other diseases of the circulatory system: Secondary | ICD-10-CM | POA: Insufficient documentation

## 2019-06-17 DIAGNOSIS — C541 Malignant neoplasm of endometrium: Secondary | ICD-10-CM | POA: Diagnosis not present

## 2019-06-17 DIAGNOSIS — K219 Gastro-esophageal reflux disease without esophagitis: Secondary | ICD-10-CM | POA: Insufficient documentation

## 2019-06-17 DIAGNOSIS — Z6838 Body mass index (BMI) 38.0-38.9, adult: Secondary | ICD-10-CM | POA: Insufficient documentation

## 2019-06-17 DIAGNOSIS — I10 Essential (primary) hypertension: Secondary | ICD-10-CM | POA: Insufficient documentation

## 2019-06-17 DIAGNOSIS — Z79899 Other long term (current) drug therapy: Secondary | ICD-10-CM | POA: Insufficient documentation

## 2019-06-17 HISTORY — PX: ROBOTIC ASSISTED TOTAL HYSTERECTOMY WITH BILATERAL SALPINGO OOPHERECTOMY: SHX6086

## 2019-06-17 HISTORY — PX: SENTINEL NODE BIOPSY: SHX6608

## 2019-06-17 LAB — TYPE AND SCREEN
ABO/RH(D): O POS
Antibody Screen: NEGATIVE

## 2019-06-17 LAB — POCT PREGNANCY, URINE: Preg Test, Ur: NEGATIVE

## 2019-06-17 SURGERY — HYSTERECTOMY, TOTAL, ROBOT-ASSISTED, LAPAROSCOPIC, WITH BILATERAL SALPINGO-OOPHORECTOMY
Anesthesia: General | Site: Abdomen

## 2019-06-17 MED ORDER — PROPOFOL 500 MG/50ML IV EMUL
INTRAVENOUS | Status: DC | PRN
  Administered 2019-06-17: 25 ug/kg/min via INTRAVENOUS

## 2019-06-17 MED ORDER — FENTANYL CITRATE (PF) 100 MCG/2ML IJ SOLN
INTRAMUSCULAR | Status: DC | PRN
  Administered 2019-06-17: 100 ug via INTRAVENOUS

## 2019-06-17 MED ORDER — LACTATED RINGERS IV SOLN
INTRAVENOUS | Status: DC
  Administered 2019-06-17: 1000 mL via INTRAVENOUS
  Filled 2019-06-17: qty 1000

## 2019-06-17 MED ORDER — DEXAMETHASONE SODIUM PHOSPHATE 10 MG/ML IJ SOLN
INTRAMUSCULAR | Status: AC
  Filled 2019-06-17: qty 1

## 2019-06-17 MED ORDER — PROPOFOL 500 MG/50ML IV EMUL
INTRAVENOUS | Status: AC
  Filled 2019-06-17: qty 50

## 2019-06-17 MED ORDER — PROMETHAZINE HCL 25 MG/ML IJ SOLN
6.2500 mg | INTRAMUSCULAR | Status: DC | PRN
  Filled 2019-06-17: qty 1

## 2019-06-17 MED ORDER — HEPARIN SODIUM (PORCINE) 5000 UNIT/ML IJ SOLN
5000.0000 [IU] | INTRAMUSCULAR | Status: AC
  Administered 2019-06-17: 5000 [IU] via SUBCUTANEOUS
  Filled 2019-06-17: qty 1

## 2019-06-17 MED ORDER — SUGAMMADEX SODIUM 500 MG/5ML IV SOLN
INTRAVENOUS | Status: DC | PRN
  Administered 2019-06-17: 445.6 mg via INTRAVENOUS

## 2019-06-17 MED ORDER — EPHEDRINE 5 MG/ML INJ
INTRAVENOUS | Status: AC
  Filled 2019-06-17: qty 10

## 2019-06-17 MED ORDER — LACTATED RINGERS IV SOLN: INTRAVENOUS | Status: DC | PRN

## 2019-06-17 MED ORDER — MIDAZOLAM HCL 2 MG/2ML IJ SOLN
INTRAMUSCULAR | Status: AC
  Filled 2019-06-17: qty 2

## 2019-06-17 MED ORDER — SODIUM CHLORIDE 0.9 % IR SOLN
Status: DC | PRN
  Administered 2019-06-17: 3000 mL

## 2019-06-17 MED ORDER — DIPHENHYDRAMINE HCL 50 MG/ML IJ SOLN
INTRAMUSCULAR | Status: AC
  Filled 2019-06-17: qty 1

## 2019-06-17 MED ORDER — EPHEDRINE SULFATE 50 MG/ML IJ SOLN
INTRAMUSCULAR | Status: DC | PRN
  Administered 2019-06-17 (×2): 10 mg via INTRAVENOUS

## 2019-06-17 MED ORDER — OXYCODONE HCL 5 MG PO TABS
ORAL_TABLET | ORAL | Status: AC
  Filled 2019-06-17: qty 1

## 2019-06-17 MED ORDER — LIDOCAINE 2% (20 MG/ML) 5 ML SYRINGE
INTRAMUSCULAR | Status: AC
  Filled 2019-06-17: qty 15

## 2019-06-17 MED ORDER — CELECOXIB 400 MG PO CAPS
400.0000 mg | ORAL_CAPSULE | ORAL | Status: AC
  Administered 2019-06-17: 400 mg via ORAL
  Filled 2019-06-17: qty 1

## 2019-06-17 MED ORDER — MIDAZOLAM HCL 5 MG/5ML IJ SOLN
INTRAMUSCULAR | Status: DC | PRN
  Administered 2019-06-17: 2 mg via INTRAVENOUS

## 2019-06-17 MED ORDER — ONDANSETRON HCL 4 MG PO TABS
4.0000 mg | ORAL_TABLET | Freq: Four times a day (QID) | ORAL | Status: DC | PRN
  Filled 2019-06-17: qty 1

## 2019-06-17 MED ORDER — HYDROMORPHONE HCL 1 MG/ML IJ SOLN
INTRAMUSCULAR | Status: AC
  Filled 2019-06-17: qty 1

## 2019-06-17 MED ORDER — ACETAMINOPHEN 500 MG PO TABS
1000.0000 mg | ORAL_TABLET | ORAL | Status: AC
  Administered 2019-06-17: 1000 mg via ORAL
  Filled 2019-06-17: qty 2

## 2019-06-17 MED ORDER — FENTANYL CITRATE (PF) 100 MCG/2ML IJ SOLN
25.0000 ug | INTRAMUSCULAR | Status: DC | PRN
  Administered 2019-06-17: 50 ug via INTRAVENOUS
  Filled 2019-06-17: qty 1

## 2019-06-17 MED ORDER — KETAMINE HCL 10 MG/ML IJ SOLN
INTRAMUSCULAR | Status: AC
  Filled 2019-06-17: qty 1

## 2019-06-17 MED ORDER — FENTANYL CITRATE (PF) 250 MCG/5ML IJ SOLN
INTRAMUSCULAR | Status: AC
  Filled 2019-06-17: qty 5

## 2019-06-17 MED ORDER — LACTATED RINGERS IV SOLN
INTRAVENOUS | Status: DC
  Filled 2019-06-17: qty 1000

## 2019-06-17 MED ORDER — ACETAMINOPHEN 500 MG PO TABS
ORAL_TABLET | ORAL | Status: AC
  Filled 2019-06-17: qty 2

## 2019-06-17 MED ORDER — SCOPOLAMINE 1 MG/3DAYS TD PT72
1.0000 | MEDICATED_PATCH | TRANSDERMAL | Status: DC
  Administered 2019-06-17: 1.5 mg via TRANSDERMAL
  Filled 2019-06-17: qty 1

## 2019-06-17 MED ORDER — PROPOFOL 10 MG/ML IV BOLUS
INTRAVENOUS | Status: AC
  Filled 2019-06-17: qty 20

## 2019-06-17 MED ORDER — BUPIVACAINE HCL 0.25 % IJ SOLN
INTRAMUSCULAR | Status: DC | PRN
  Administered 2019-06-17: 17 mL

## 2019-06-17 MED ORDER — HYDROMORPHONE HCL 1 MG/ML IJ SOLN
0.2000 mg | INTRAMUSCULAR | Status: DC | PRN
  Administered 2019-06-17: 0.5 mg via INTRAVENOUS
  Filled 2019-06-17: qty 1

## 2019-06-17 MED ORDER — DEXAMETHASONE SODIUM PHOSPHATE 4 MG/ML IJ SOLN
4.0000 mg | INTRAMUSCULAR | Status: DC
  Filled 2019-06-17: qty 1

## 2019-06-17 MED ORDER — OXYCODONE HCL 5 MG PO TABS
5.0000 mg | ORAL_TABLET | ORAL | Status: DC | PRN
  Administered 2019-06-17: 5 mg via ORAL
  Filled 2019-06-17: qty 2

## 2019-06-17 MED ORDER — CEFAZOLIN SODIUM-DEXTROSE 2-4 GM/100ML-% IV SOLN
2.0000 g | INTRAVENOUS | Status: AC
  Administered 2019-06-17: 2 g via INTRAVENOUS
  Filled 2019-06-17: qty 100

## 2019-06-17 MED ORDER — GABAPENTIN 300 MG PO CAPS
300.0000 mg | ORAL_CAPSULE | ORAL | Status: AC
  Administered 2019-06-17: 300 mg via ORAL
  Filled 2019-06-17: qty 1

## 2019-06-17 MED ORDER — TRAMADOL HCL 50 MG PO TABS
50.0000 mg | ORAL_TABLET | Freq: Four times a day (QID) | ORAL | Status: DC | PRN
  Filled 2019-06-17: qty 1

## 2019-06-17 MED ORDER — ONDANSETRON HCL 4 MG/2ML IJ SOLN
INTRAMUSCULAR | Status: DC | PRN
  Administered 2019-06-17: 4 mg via INTRAVENOUS

## 2019-06-17 MED ORDER — LIDOCAINE 2% (20 MG/ML) 5 ML SYRINGE
INTRAMUSCULAR | Status: DC | PRN
  Administered 2019-06-17: 1.5 mg/kg/h via INTRAVENOUS

## 2019-06-17 MED ORDER — CEFAZOLIN SODIUM-DEXTROSE 2-4 GM/100ML-% IV SOLN
INTRAVENOUS | Status: AC
  Filled 2019-06-17: qty 100

## 2019-06-17 MED ORDER — ONDANSETRON HCL 4 MG/2ML IJ SOLN
4.0000 mg | Freq: Four times a day (QID) | INTRAMUSCULAR | Status: DC | PRN
  Filled 2019-06-17: qty 2

## 2019-06-17 MED ORDER — FENTANYL CITRATE (PF) 100 MCG/2ML IJ SOLN
INTRAMUSCULAR | Status: AC
  Filled 2019-06-17: qty 2

## 2019-06-17 MED ORDER — SCOPOLAMINE 1 MG/3DAYS TD PT72
MEDICATED_PATCH | TRANSDERMAL | Status: AC
  Filled 2019-06-17: qty 1

## 2019-06-17 MED ORDER — ROCURONIUM BROMIDE 10 MG/ML (PF) SYRINGE
PREFILLED_SYRINGE | INTRAVENOUS | Status: AC
  Filled 2019-06-17: qty 20

## 2019-06-17 MED ORDER — ROCURONIUM BROMIDE 50 MG/5ML IV SOSY
PREFILLED_SYRINGE | INTRAVENOUS | Status: DC | PRN
  Administered 2019-06-17: 40 mg via INTRAVENOUS
  Administered 2019-06-17: 60 mg via INTRAVENOUS
  Administered 2019-06-17: 20 mg via INTRAVENOUS

## 2019-06-17 MED ORDER — CELECOXIB 200 MG PO CAPS
ORAL_CAPSULE | ORAL | Status: AC
  Filled 2019-06-17: qty 2

## 2019-06-17 MED ORDER — KETAMINE HCL 10 MG/ML IJ SOLN
INTRAMUSCULAR | Status: DC | PRN
  Administered 2019-06-17: 30 mg via INTRAVENOUS

## 2019-06-17 MED ORDER — GABAPENTIN 300 MG PO CAPS
ORAL_CAPSULE | ORAL | Status: AC
  Filled 2019-06-17: qty 1

## 2019-06-17 MED ORDER — ONDANSETRON HCL 4 MG/2ML IJ SOLN
INTRAMUSCULAR | Status: AC
  Filled 2019-06-17: qty 2

## 2019-06-17 MED ORDER — PROPOFOL 10 MG/ML IV BOLUS
INTRAVENOUS | Status: DC | PRN
  Administered 2019-06-17: 180 mg via INTRAVENOUS

## 2019-06-17 MED ORDER — DEXAMETHASONE SODIUM PHOSPHATE 4 MG/ML IJ SOLN
INTRAMUSCULAR | Status: DC | PRN
  Administered 2019-06-17: 8 mg via INTRAVENOUS

## 2019-06-17 MED ORDER — HEPARIN SODIUM (PORCINE) 5000 UNIT/ML IJ SOLN
INTRAMUSCULAR | Status: AC
  Filled 2019-06-17: qty 1

## 2019-06-17 MED ORDER — LIDOCAINE 2% (20 MG/ML) 5 ML SYRINGE
INTRAMUSCULAR | Status: DC | PRN
  Administered 2019-06-17: 100 mg via INTRAVENOUS

## 2019-06-17 MED ORDER — SUGAMMADEX SODIUM 500 MG/5ML IV SOLN
INTRAVENOUS | Status: AC
  Filled 2019-06-17: qty 5

## 2019-06-17 SURGICAL SUPPLY — 71 items
APPLICATOR SURGIFLO ENDO (HEMOSTASIS) IMPLANT
BAG LAPAROSCOPIC 12 15 PORT 16 (BASKET) IMPLANT
BAG RETRIEVAL 12/15 (BASKET)
BAG RETRIEVAL 12/15MM (BASKET)
BLADE SURG 10 STRL SS (BLADE) IMPLANT
COVER BACK TABLE 60X90IN (DRAPES) ×3 IMPLANT
COVER TIP SHEARS 8 DVNC (MISCELLANEOUS) ×1 IMPLANT
COVER TIP SHEARS 8MM DA VINCI (MISCELLANEOUS) ×2
COVER WAND RF STERILE (DRAPES) ×3 IMPLANT
DECANTER SPIKE VIAL GLASS SM (MISCELLANEOUS) IMPLANT
DERMABOND ADVANCED (GAUZE/BANDAGES/DRESSINGS) ×2
DERMABOND ADVANCED .7 DNX12 (GAUZE/BANDAGES/DRESSINGS) ×1 IMPLANT
DRAPE ARM DVNC X/XI (DISPOSABLE) ×4 IMPLANT
DRAPE COLUMN DVNC XI (DISPOSABLE) ×1 IMPLANT
DRAPE DA VINCI XI ARM (DISPOSABLE) ×8
DRAPE DA VINCI XI COLUMN (DISPOSABLE) ×2
DRAPE SHEET LG 3/4 BI-LAMINATE (DRAPES) ×3 IMPLANT
DRAPE SURG IRRIG POUCH 19X23 (DRAPES) ×3 IMPLANT
ELECT REM PT RETURN 9FT ADLT (ELECTROSURGICAL) ×3
ELECTRODE REM PT RTRN 9FT ADLT (ELECTROSURGICAL) ×1 IMPLANT
GAUZE 4X4 16PLY RFD (DISPOSABLE) ×3 IMPLANT
GLOVE BIO SURGEON STRL SZ 6.5 (GLOVE) ×4 IMPLANT
GLOVE BIO SURGEON STRL SZ7 (GLOVE) ×6 IMPLANT
GLOVE BIO SURGEONS STRL SZ 6.5 (GLOVE) ×2
GLOVE BIOGEL PI IND STRL 7.0 (GLOVE) ×3 IMPLANT
GLOVE BIOGEL PI IND STRL 7.5 (GLOVE) ×2 IMPLANT
GLOVE BIOGEL PI IND STRL 8.5 (GLOVE) ×1 IMPLANT
GLOVE BIOGEL PI INDICATOR 7.0 (GLOVE) ×6
GLOVE BIOGEL PI INDICATOR 7.5 (GLOVE) ×4
GLOVE BIOGEL PI INDICATOR 8.5 (GLOVE) ×2
GLOVE INDICATOR 8.5 STRL (GLOVE) ×6 IMPLANT
GLOVE SURG ORTHO 6.0 STRL STRW (GLOVE) ×12 IMPLANT
HOLDER FOLEY CATH W/STRAP (MISCELLANEOUS) ×3 IMPLANT
IRRIG SUCT STRYKERFLOW 2 WTIP (MISCELLANEOUS) ×3
IRRIGATION SUCT STRKRFLW 2 WTP (MISCELLANEOUS) ×1 IMPLANT
KIT PROCEDURE DA VINCI SI (MISCELLANEOUS) ×2
KIT PROCEDURE DVNC SI (MISCELLANEOUS) ×1 IMPLANT
KIT TURNOVER CYSTO (KITS) IMPLANT
LEGGING LITHOTOMY PAIR STRL (DRAPES) ×3 IMPLANT
MANIPULATOR UTERINE 4.5 ZUMI (MISCELLANEOUS) ×3 IMPLANT
NDL SAFETY ECLIPSE 18X1.5 (NEEDLE) IMPLANT
NEEDLE HYPO 18GX1.5 SHARP (NEEDLE)
NEEDLE HYPO 22GX1.5 SAFETY (NEEDLE) ×3 IMPLANT
NEEDLE SPNL 18GX3.5 QUINCKE PK (NEEDLE) ×3 IMPLANT
OBTURATOR OPTICAL STANDARD 8MM (TROCAR) ×2
OBTURATOR OPTICAL STND 8 DVNC (TROCAR) ×1
OBTURATOR OPTICALSTD 8 DVNC (TROCAR) ×1 IMPLANT
PACK ROBOT GYN CUSTOM WL (TRAY / TRAY PROCEDURE) ×3 IMPLANT
PACK ROBOTIC GOWN (GOWN DISPOSABLE) ×3 IMPLANT
PAD POSITIONING PINK XL (MISCELLANEOUS) ×3 IMPLANT
PENCIL BUTTON HOLSTER BLD 10FT (ELECTRODE) IMPLANT
PORT ACCESS TROCAR AIRSEAL 12 (TROCAR) ×1 IMPLANT
PORT ACCESS TROCAR AIRSEAL 5M (TROCAR) ×2
POUCH SPECIMEN RETRIEVAL 10MM (ENDOMECHANICALS) IMPLANT
SEAL CANN UNIV 5-8 DVNC XI (MISCELLANEOUS) ×4 IMPLANT
SEAL XI 5MM-8MM UNIVERSAL (MISCELLANEOUS) ×8
SET TRI-LUMEN FLTR TB AIRSEAL (TUBING) ×3 IMPLANT
SURGIFLO W/THROMBIN 8M KIT (HEMOSTASIS) IMPLANT
SUT VIC AB 0 CT1 36 (SUTURE) IMPLANT
SUT VIC AB 3-0 SH 27 (SUTURE)
SUT VIC AB 3-0 SH 27X BRD (SUTURE) IMPLANT
SUT VIC AB 4-0 PS2 18 (SUTURE) ×6 IMPLANT
SYR 10ML LL (SYRINGE) ×3 IMPLANT
TRAP SPECIMEN MUCOUS 40CC (MISCELLANEOUS) IMPLANT
TRAY FOLEY W/BAG SLVR 14FR (SET/KITS/TRAYS/PACK) ×3 IMPLANT
TROCAR XCEL 12X100 BLDLESS (ENDOMECHANICALS) IMPLANT
TUBE CONNECTING 12'X1/4 (SUCTIONS) ×1
TUBE CONNECTING 12X1/4 (SUCTIONS) ×2 IMPLANT
UNDERPAD 30X30 (UNDERPADS AND DIAPERS) ×3 IMPLANT
WATER STERILE IRR 1000ML POUR (IV SOLUTION) ×3 IMPLANT
YANKAUER SUCT BULB TIP NO VENT (SUCTIONS) IMPLANT

## 2019-06-17 NOTE — Discharge Instructions (Addendum)
06/17/2019  Return to work: 4-6 weeks if applicable  Activity: 1. Be up and out of the bed during the day.  Take a nap if needed.  You may walk up steps but be careful and use the hand rail.  Stair climbing will tire you more than you think, you may need to stop part way and rest.   2. No lifting or straining for 6 weeks.  3. No driving for 1 week(s).  Do not drive if you are taking narcotic pain medicine.  4. Shower daily.  Use soap and water on your incision and pat dry; don't rub.  No tub baths until cleared by your surgeon.   5. No sexual activity and nothing in the vagina for 8 weeks.  6. You may experience a small amount of clear drainage from your incisions, which is normal.  If the drainage persists or increases, please call the office.  7. You may experience vaginal spotting after surgery or around the 6-8 week mark from surgery when the stitches at the top of the vagina begin to dissolve.  The spotting is normal but if you experience heavy bleeding, call our office.  8. Take Tylenol or ibuprofen first for pain and only use narcotic pain medication for severe pain not relieved by the Tylenol or Ibuprofen.  Monitor your Tylenol intake to a max of 4,000 mg.  Diet: 1. Low sodium Heart Healthy Diet is recommended.  2. It is safe to use a laxative, such as Miralax or Colace, if you have difficulty moving your bowels. You can take Sennakot at bedtime every evening to keep bowel movements regular and to prevent constipation.    Wound Care: 1. Keep clean and dry.  Shower daily.  Reasons to call the Doctor:  Fever - Oral temperature greater than 100.4 degrees Fahrenheit  Foul-smelling vaginal discharge  Difficulty urinating  Nausea and vomiting  Increased pain at the site of the incision that is unrelieved with pain medicine.  Difficulty breathing with or without chest pain  New calf pain especially if only on one side  Sudden, continuing increased vaginal bleeding with or  without clots.   Contacts: For questions or concerns you should contact:  Dr. Jeral Pinch at [phone]  Joylene John, NP at [phone]  After Hours: call [phone] and have the GYN Oncologist paged/contacted   Post Anesthesia Home Care Instructions  Activity: Get plenty of rest for the remainder of the day. A responsible individual must stay with you for 24 hours following the procedure.  For the next 24 hours, DO NOT: -Drive a car -Paediatric nurse -Drink alcoholic beverages -Take any medication unless instructed by your physician -Make any legal decisions or sign important papers.  Meals: Start with liquid foods such as gelatin or soup. Progress to regular foods as tolerated. Avoid greasy, spicy, heavy foods. If nausea and/or vomiting occur, drink only clear liquids until the nausea and/or vomiting subsides. Call your physician if vomiting continues.  Special Instructions/Symptoms: Your throat may feel dry or sore from the anesthesia or the breathing tube placed in your throat during surgery. If this causes discomfort, gargle with warm salt water. The discomfort should disappear within 24 hours.  If you had a scopolamine patch placed behind your ear for the management of post- operative nausea and/or vomiting:  1. The medication in the patch is effective for 72 hours, after which it should be removed.  Wrap patch in a tissue and discard in the trash. Wash hands thoroughly with  soap and water. 2. You may remove the patch earlier than 72 hours if you experience unpleasant side effects which may include dry mouth, dizziness or visual disturbances. 3. Avoid touching the patch. Wash your hands with soap and water after contact with the patch.

## 2019-06-17 NOTE — Anesthesia Postprocedure Evaluation (Signed)
Anesthesia Post Note  Patient: Amber Cobb  Procedure(s) Performed: XI ROBOTIC ASSISTED TOTAL HYSTERECTOMY WITH BILATERAL SALPINGECTOMY AND  LYMPH NODE DISSECTION (N/A Abdomen) SENTINEL NODE BIOPSY (N/A Abdomen)     Patient location during evaluation: PACU Anesthesia Type: General Level of consciousness: awake and alert Pain management: pain level controlled Vital Signs Assessment: post-procedure vital signs reviewed and stable Respiratory status: spontaneous breathing, nonlabored ventilation and respiratory function stable Cardiovascular status: blood pressure returned to baseline and stable Postop Assessment: no apparent nausea or vomiting Anesthetic complications: no    Last Vitals:  Vitals:   06/17/19 1215 06/17/19 1304  BP: 127/74 125/79  Pulse: 77 65  Resp: 17 18  Temp:    SpO2: 98% 97%    Last Pain:  Vitals:   06/17/19 1304  TempSrc:   PainSc: 4                  Catalina Gravel

## 2019-06-17 NOTE — Interval H&P Note (Signed)
History and Physical Interval Note:  06/17/2019 8:06 AM  Amber Cobb  has presented today for surgery, with the diagnosis of COMPLEX ATYPICAL ENDOMETRIAL HYPERPLASIA.  The various methods of treatment have been discussed with the patient and family. After consideration of risks, benefits and other options for treatment, the patient has consented to  Procedure(s): XI ROBOTIC ASSISTED TOTAL HYSTERECTOMY WITH BILATERAL SALPINGECTOMY AND POSSIBLE LYMPH NODE DISSECTION AND POSSIBLE LAPAROTOMY (N/A) SENTINEL NODE BIOPSY (N/A) as a surgical intervention.  The patient's history has been reviewed, patient examined, no change in status, stable for surgery.  I have reviewed the patient's chart and labs.  Questions were answered to the patient's satisfaction.     Lafonda Mosses

## 2019-06-17 NOTE — Op Note (Signed)
OPERATIVE NOTE  Pre-operative Diagnosis: EIN/CAH  Post-operative Diagnosis: same  Operation: Robotic-assisted laparoscopic total hysterectomy with bilateral salpingectomy, SLN biopsy   Surgeon: Jeral Pinch MD  Assistant Surgeon: Joylene John NP  Anesthesia: GET  Urine Output: 25 cc  Operative Findings:  On EUA, mobile 8-10 cm uterus, no adnexal masses. On intra-abdominal entry, normal appearing liver edge, diaphragm and omentum. Normal small and large bowel, appendix, and colon. Uterus 10cm and bulbous at the fundus. Normal appearing bilateral adnexa. Mapping successful to bilateral SLNs; external iliac SLN somewhat enlarged. No intra-abdominal or pelvic evidence of disease.   Estimated Blood Loss:  less than 50 mL      Total IV Fluids: see I&O flowsheet         Specimens: uterus, cervix, bilateral tubes, right and left external iliac sentinel lymph node, right obturator sentinel lymph node, right obturator lymph nodes (adjacent to obturator SLN and removed with it)         Complications:  None apparent; patient tolerated the procedure well.         Disposition: PACU - hemodynamically stable.  Procedure Details  The patient was seen in the Holding Room. The risks, benefits, complications, treatment options, and expected outcomes were discussed with the patient.  The patient concurred with the proposed plan, giving informed consent.  The site of surgery properly noted/marked. The patient was identified as Amber Cobb and the procedure verified as a Robotic-assisted hysterectomy with bilateral salpingo oophorectomy with SLN biopsy.   After induction of anesthesia, the patient was draped and prepped in the usual sterile manner. Patient was placed in supine position after anesthesia and draped and prepped in the usual sterile manner as follows: Her arms were tucked to her side with all appropriate precautions.  The shoulders were stabilized with padded shoulder blocks applied to  the acromium processes.  The patient was placed in the semi-lithotomy position in [place].  The perineum and vagina were prepped with CholoraPrep. The patient was draped after the CholoraPrep had been allowed to dry for 3 minutes.  A Time Out was held and the above information confirmed.  The urethra was prepped with Betadine. Foley catheter was placed.  A sterile speculum was placed in the vagina.  The cervix was grasped with a single-tooth tenaculum. 2mg  total of ICG was injected into the cervical stroma at 2 and 9 o'clock with 1cc injected at a 1cm and 61mm depth (concentration 0.5mg /ml) in all locations. The cervix was dilated with Kennon Rounds dilators.  The ZUMI uterine manipulator with a medium colpotomizer ring was placed without difficulty.  OG tube placement was confirmed and to suction.   Next, a 10 mm skin incision was made 2 cm below the subcostal margin in the midclavicular line.  The 5 mm Optiview port and scope was used for direct entry.  Opening pressure was under 10 mm CO2.  The abdomen was insufflated and the findings were noted as above.   At this point and all points during the procedure, the patient's intra-abdominal pressure did not exceed 15 mmHg. Next, an 8 mm skin incision was made superior to the umbilicus and a right and left port were placed about 8 cm lateral to the robot port on the right and left side.  A fourth arm was placed on the right.  The 5 mm assist trocar was exchanged for a 10-12 mm port. All ports were placed under direct visualization.  The patient was placed in steep Trendelenburg.  Bowel was already  noted to be in the upper abdomen.  The robot was docked in the normal manner.  The right and left peritoneum were opened parallel to the IP ligament to open the retroperitoneal spaces bilaterally. The round ligaments were transected. The SLN mapping was performed in bilateral pelvic basins. After identifying the ureters, the para rectal and paravesical spaces were opened  up entirely with careful dissection below the level of the ureters bilaterally and to the depth of the uterine artery origin in order to skeletonize the uterine "web" and ensure visualization of all parametrial channels. The para-aortic basins were carefully exposed and evaluated for isolated para-aortic SLN's. Lymphatic channels were identified travelling to the following visualized sentinel lymph node's: right obturator and external iliac SLNs, left external iliac SLN. These SLN's were separated from their surrounding lymphatic tissue, removed and sent for permanent pathology.  The hysterectomy was started.  The ureter was again noted to be on the medial leaf of the broad ligament.  The peritoneum above the ureter was incised and stretched and the infundibulopelvic ligament was skeletonized, cauterized and cut.  The posterior peritoneum was taken down to the level of the KOH ring.  The anterior peritoneum was also taken down.  The bladder flap was created to the level of the KOH ring.  The uterine artery on the right side was skeletonized, cauterized and cut in the normal manner.  A similar procedure was performed on the left.  The colpotomy was made and the uterus, cervix, bilateral ovaries and tubes were amputated and delivered through the vagina.  Pedicles were inspected and excellent hemostasis was achieved.    The colpotomy at the vaginal cuff was closed with Vicryl on a CT1 needle in a running manner.  Irrigation was used and excellent hemostasis was achieved.  At this point in the procedure was completed.  Robotic instruments were removed under direct visulaization.  The robot was undocked. The fascia at the 10-12 mm port was closed with 0 Vicryl on a UR-5 needle.  The subcuticular tissue was closed with 4-0 Vicryl and the skin was closed with 4-0 Monocryl using a mattress stich.  Dermabond was applied.    The vagina was swabbed with  minimal bleeding noted.   All sponge, lap and needle counts were  correct x 2.   The patient was transferred to the recovery room in stable condition.  Jeral Pinch, MD

## 2019-06-17 NOTE — Anesthesia Procedure Notes (Signed)
Procedure Name: Intubation Date/Time: 06/17/2019 8:47 AM Performed by: Deliah Boston, CRNA Pre-anesthesia Checklist: Patient identified, Emergency Drugs available, Suction available and Patient being monitored Patient Re-evaluated:Patient Re-evaluated prior to induction Oxygen Delivery Method: Circle system utilized Preoxygenation: Pre-oxygenation with 100% oxygen Induction Type: IV induction Ventilation: Mask ventilation without difficulty Laryngoscope Size: Mac and 3 Grade View: Grade I Tube type: Oral Tube size: 7.5 mm Number of attempts: 1 Airway Equipment and Method: Stylet and Oral airway Placement Confirmation: ETT inserted through vocal cords under direct vision,  positive ETCO2 and breath sounds checked- equal and bilateral Secured at: 23 cm Tube secured with: Tape Dental Injury: Teeth and Oropharynx as per pre-operative assessment

## 2019-06-17 NOTE — Transfer of Care (Signed)
Immediate Anesthesia Transfer of Care Note  Patient: Amber Cobb  Procedure(s) Performed: Procedure(s): XI ROBOTIC ASSISTED TOTAL HYSTERECTOMY WITH BILATERAL SALPINGECTOMY AND  LYMPH NODE DISSECTION (N/A) SENTINEL NODE BIOPSY (N/A)  Patient Location: PACU  Anesthesia Type:General  Level of Consciousness: Patient easily awoken, sedated, comfortable, cooperative, following commands, responds to stimulation.   Airway & Oxygen Therapy: Patient spontaneously breathing, ventilating well, oxygen via simple oxygen mask.  Post-op Assessment: Report given to PACU RN, vital signs reviewed and stable, moving all extremities.   Post vital signs: Reviewed and stable.  Complications: No apparent anesthesia complications  Last Vitals:  Vitals Value Taken Time  BP 113/68 06/17/19 1101  Temp    Pulse 65 06/17/19 1108  Resp 22 06/17/19 1108  SpO2 100 % 06/17/19 1108  Vitals shown include unvalidated device data.  Last Pain:  Vitals:   06/17/19 0711  TempSrc:   PainSc: 0-No pain      Patients Stated Pain Goal: 7 ([id number])  Complications: No apparent anesthesia complications

## 2019-06-18 ENCOUNTER — Other Ambulatory Visit: Payer: Self-pay

## 2019-06-18 ENCOUNTER — Telehealth: Payer: Self-pay | Admitting: *Deleted

## 2019-06-18 NOTE — Progress Notes (Deleted)
Gynecologic Oncology Telehealth Consult Note: Gyn-Onc  I connected with Yves Dill on 06/22/19 at  3:15 PM EST by telephone and verified that I am speaking with the correct person using two identifiers.  I discussed the limitations, risks, security and privacy concerns of performing an evaluation and management service by telemedicine and the availability of in-person appointments. I also discussed with the patient that there may be a patient responsible charge related to this service. The patient expressed understanding and agreed to proceed.  Other persons participating in the visit and their role in the encounter: ***.  Patient's location: *** Provider's location: ***  Reason for Visit: ***  Treatment History: Oncology History  Endometrial cancer ([place])  05/06/2019 Initial Biopsy   EMB: at least [facility]   05/21/2019 Imaging   CT abdomen/pelvis: Endometrial thickening, compatible with reported history of endometrial cancer.  No evidence of metastatic disease in the abdomen or pelvis.  7 mm hypodensity in the uncinate process, indeterminate and likely benign.  MRI of the abdomen with and without contrast could be used to further evaluate.   06/17/2019 Surgery   TRH/BS, SLN: On EUA, mobile 8-10 cm uterus, no adnexal masses. On intra-abdominal entry, normal appearing liver edge, diaphragm and omentum. Normal small and large bowel, appendix, and colon. Uterus 10cm and bulbous at the fundus. Normal appearing bilateral adnexa. Mapping successful to bilateral SLNs; external iliac SLN somewhat enlarged. No intra-abdominal or pelvic evidence of disease.    06/17/2019 Initial Diagnosis   Endometrial cancer ([place])   06/17/2019 Pathologic Stage   IA, grade 1, no LVSI, negative SLNs  A. LYMPH NODE, SENTINEL, RIGHT EXTERNAL ILIAC, BIOPSY:  -  Benign adipose tissue  -  No lymphoid tissue identified   B. LYMPH NODE, SENTINEL, RIGHT OBTURATOR, BIOPSY:  -  No carcinoma identified in one lymph node (0/1)   -  See comment   C. LYMPH NODE, RIGHT OBTURATOR, BIOPSY:  -  No carcinoma identified in one lymph node (0/1)  -  See comment   D. LYMPH NODE, SENTINEL, LEFT EXTERNAL ILIAC, BIOPSY:  -  No carcinoma identified in one lymph node (0/1)  -  See comment   E. UTERUS, CERVIX, BILATERAL FALLOPIAN TUBES, HYSTERECTOMY WITH  SALPINGECTOMY:  Uterus:  -  Endometrioid carcinoma, FIGO grade 1  -  Carcinoma confined to the endometrium  -  See oncology table and comment below   Cervix:  -  No carcinoma identified   Bilateral Fallopian tubes:  -  Benign fallopian tubes with paratubal cysts  -  No carcinoma identified   COMMENT:   A.  Dr. Vic Ripper reviewed the case and agrees with the above diagnosis.   B-D.  Cytokeratin AE1/3 was performed on the sentinel lymph nodes to  exclude micrometastasis.  There is no evidence of metastatic carcinoma  by immunohistochemistry.   ONCOLOGY TABLE:   UTERUS, CARCINOMA OR CARCINOSARCOMA   Procedure: Total hysterectomy and bilateral salpingectomy  Histologic type: Endometrioid carcinoma  Histologic Grade: FIGO grade 1  Myometrial invasion: Carcinoma limited to the endometrium  Uterine Serosa Involvement: Not identified  Cervical stromal involvement: Not identified  Extent of involvement of other organs: Not identified  Lymphovascular invasion: Not identified  Regional Lymph Nodes:       Examined:     3 Sentinel                               0 non-sentinel  3 total        Lymph nodes with metastasis: 0        Isolated tumor cells (<0.2 mm): 0        Micrometastasis:  (>0.2 mm and < 2.0 mm): 0        Macrometastasis: (>2.0 mm): 0        Extracapsular extension: N/A  Representative Tumor Block: E4  MMR / MSI testing: Will be ordered  Pathologic Stage Classification (pTNM, AJCC 8th edition):  pTIa, pN0  Comments: See above      Interval History: ***  Past Medical/Surgical History: Past Medical History:   Diagnosis Date  . Anemia   . Carpal tunnel syndrome   . Carpal tunnel syndrome   . Complex atypical endometrial hyperplasia   . Depression   . Diverticulosis   . Generalized anxiety disorder   . GERD (gastroesophageal reflux disease)   . History of kidney stones   . Hypertension   . Migraines     Past Surgical History:  Procedure Laterality Date  . CHOLECYSTECTOMY    . ENDOMETRIAL BIOPSY  05/05/2019  . ROBOTIC ASSISTED TOTAL HYSTERECTOMY WITH BILATERAL SALPINGO OOPHERECTOMY N/A 06/17/2019   Procedure: XI ROBOTIC ASSISTED TOTAL HYSTERECTOMY WITH BILATERAL SALPINGECTOMY AND  LYMPH NODE DISSECTION;  Surgeon: Lafonda Mosses, MD;  Location: [facility];  Service: Gynecology;  Laterality: N/A;  . SENTINEL NODE BIOPSY N/A 06/17/2019   Procedure: SENTINEL NODE BIOPSY;  Surgeon: Lafonda Mosses, MD;  Location: [facility];  Service: Gynecology;  Laterality: N/A;    Family History  Problem Relation Age of Onset  . Hypertension Mother   . Diabetes Mother   . COPD Mother   . Thyroid disease Mother   . Thyroid disease Sister   . Thyroid disease Brother   . Throat cancer Maternal Grandfather   . Endometrial cancer Maternal Aunt     Social History   Socioeconomic History  . Marital status: Married    Spouse name: Not on file  . Number of children: Not on file  . Years of education: Not on file  . Highest education level: Not on file  Occupational History  . Not on file  Tobacco Use  . Smoking status: Never Smoker  . Smokeless tobacco: Never Used  Substance and Sexual Activity  . Alcohol use: Never  . Drug use: Never  . Sexual activity: Yes  Other Topics Concern  . Not on file  Social History Narrative  . Not on file   Social Determinants of Health   Financial Resource Strain:   . Difficulty of Paying Living Expenses: Not on file  Food Insecurity:   . Worried About Charity fundraiser in the Last Year: Not on file  . Ran Out of Food  in the Last Year: Not on file  Transportation Needs:   . Lack of Transportation (Medical): Not on file  . Lack of Transportation (Non-Medical): Not on file  Physical Activity:   . Days of Exercise per Week: Not on file  . Minutes of Exercise per Session: Not on file  Stress:   . Feeling of Stress : Not on file  Social Connections:   . Frequency of Communication with Friends and Family: Not on file  . Frequency of Social Gatherings with Friends and Family: Not on file  . Attends Religious Services: Not on file  . Active Member of Clubs or Organizations: Not on file  . Attends Archivist  Meetings: Not on file  . Marital Status: Not on file    Current Medications:  Current Outpatient Medications:  .  amLODipine (NORVASC) 5 MG tablet, Take 5 mg by mouth daily., Disp: , Rfl:  .  butalbital-acetaminophen-caffeine (FIORICET) 50-325-40 MG tablet, Take 1 tablet by mouth every 6 (six) hours as needed., Disp: , Rfl:  .  escitalopram (LEXAPRO) 10 MG tablet, Take 10 mg by mouth daily., Disp: , Rfl:  .  FEROSUL 325 (65 Fe) MG tablet, Take 325 mg by mouth daily., Disp: , Rfl:  .  ibuprofen (ADVIL) 800 MG tablet, Take 1 tablet (800 mg total) by mouth every 8 (eight) hours as needed for moderate pain. For AFTER surgery only, Disp: 30 tablet, Rfl: 0 .  loratadine (CLARITIN) 10 MG tablet, Take 10 mg by mouth daily., Disp: , Rfl:  .  losartan (COZAAR) 100 MG tablet, Take 100 mg by mouth daily., Disp: , Rfl:  .  omeprazole (PRILOSEC) 40 MG capsule, Take 40 mg by mouth daily as needed., Disp: , Rfl:  .  oxyCODONE (OXY IR/ROXICODONE) 5 MG immediate release tablet, Take 1 tablet (5 mg total) by mouth every 4 (four) hours as needed for severe pain. For AFTER surgery only, do not take and drive, Disp: 15 tablet, Rfl: 0 .  senna-docusate (SENOKOT-S) 8.6-50 MG tablet, Take 2 tablets by mouth at bedtime. For AFTER surgery, do not take if having loose stools, Disp: 30 tablet, Rfl: 1  Review of  Symptoms: Denies appetite changes, fevers, chills, fatigue, unexplained weight changes. Denies hearing loss, neck lumps or masses, mouth sores, ringing in ears or voice changes. Denies cough or wheezing.  Denies shortness of breath. Denies chest pain or palpitations. Denies leg swelling. Denies abdominal distention, pain, blood in stools, constipation, diarrhea, nausea, vomiting, or early satiety. Denies pain with intercourse, dysuria, frequency, hematuria or incontinence. Denies hot flashes, pelvic pain, vaginal bleeding or vaginal discharge.   Denies joint pain, back pain or muscle pain/cramps. Denies itching, rash, or wounds. Denies dizziness, headaches, numbness or seizures. Denies swollen lymph nodes or glands, denies easy bruising or bleeding. Denies anxiety, depression, confusion, or decreased concentration.  Physical Exam: LMP 06/15/2019  Not performed given limitations of this visit type.  Laboratory & Radiologic Studies: ***  Assessment & Plan: Courtenay Hirth is a 44 y.o. woman with Stage *** who presents for ***.  ***  I discussed the assessment and treatment plan with the patient. The patient was provided with an opportunity to ask questions and all were answered. The patient agreed with the plan and demonstrated an understanding of the instructions.   The patient was advised to call back or see an in-person evaluation if the symptoms worsen or if the condition fails to improve as anticipated.   *** minutes of total time was spent for this patient encounter, including preparation, face-to-face counseling with the patient and coordination of care, and documentation of the encounter.   Jeral Pinch, MD  Division of Gynecologic Oncology  Department of Obstetrics and Gynecology  [facility]

## 2019-06-18 NOTE — Telephone Encounter (Signed)
Pt up and ambulating today. Eating and drinking well. Pt using Ibuprofen for discomfort as needed. Pt states that she is urinating with some slight burning, pt encouraged to increase water intake. Pt passing gas, but no BM yet. Pt did not take Senokot last night, but she states that she will take today. Pt reports that her incisions are C/D/I. Pt states that she has office number and will call with any concerns. Upcoming phone and office visits confirmed w/ pt.

## 2019-06-22 ENCOUNTER — Telehealth: Payer: Self-pay | Admitting: *Deleted

## 2019-06-22 ENCOUNTER — Encounter: Payer: Self-pay | Admitting: Gynecologic Oncology

## 2019-06-22 ENCOUNTER — Inpatient Hospital Stay: Payer: BC Managed Care – PPO | Attending: Gynecologic Oncology | Admitting: Gynecologic Oncology

## 2019-06-22 DIAGNOSIS — Z90722 Acquired absence of ovaries, bilateral: Secondary | ICD-10-CM

## 2019-06-22 DIAGNOSIS — C541 Malignant neoplasm of endometrium: Secondary | ICD-10-CM | POA: Insufficient documentation

## 2019-06-22 DIAGNOSIS — Z90710 Acquired absence of both cervix and uterus: Secondary | ICD-10-CM

## 2019-06-22 LAB — SURGICAL PATHOLOGY

## 2019-06-22 NOTE — Telephone Encounter (Signed)
Pt informed incision does not look infected via email photo, continue to watch per Joylene John NP.  Pt states dermabond on incision is intact.  Pt instructed to call tomorrow morning regarding incision status, verbalizes understanding.

## 2019-06-22 NOTE — Progress Notes (Signed)
Gynecologic Oncology Telehealth Consult Note: Gyn-Onc  I connected with Amber Cobb on 06/22/19 at  9:45 AM EST by telephone and verified that I am speaking with the correct person using two identifiers.  I discussed the limitations, risks, security and privacy concerns of performing an evaluation and management service by telemedicine and the availability of in-person appointments. I also discussed with the patient that there may be a patient responsible charge related to this service. The patient expressed understanding and agreed to proceed.  Other persons participating in the visit and their role in the encounter: none.  Patient's location: clinic in [place] Provider's location: [facility]  Reason for Visit: post-operative follow-up, discussion regarding pathology and treatment planning  Treatment History: Oncology History  Endometrial cancer ([place])  05/06/2019 Initial Biopsy   EMB: at least [facility]   05/21/2019 Imaging   CT abdomen/pelvis: Endometrial thickening, compatible with reported history of endometrial cancer.  No evidence of metastatic disease in the abdomen or pelvis.  7 mm hypodensity in the uncinate process, indeterminate and likely benign.  MRI of the abdomen with and without contrast could be used to further evaluate.   06/17/2019 Surgery   TRH/BS, SLN: On EUA, mobile 8-10 cm uterus, no adnexal masses. On intra-abdominal entry, normal appearing liver edge, diaphragm and omentum. Normal small and large bowel, appendix, and colon. Uterus 10cm and bulbous at the fundus. Normal appearing bilateral adnexa. Mapping successful to bilateral SLNs; external iliac SLN somewhat enlarged. No intra-abdominal or pelvic evidence of disease.    06/17/2019 Initial Diagnosis   Endometrial cancer ([place])   06/17/2019 Pathologic Stage   IA, grade 1, no LVSI, negative SLNs  A. LYMPH NODE, SENTINEL, RIGHT EXTERNAL ILIAC, BIOPSY:  -  Benign adipose tissue  -  No lymphoid tissue identified   B. LYMPH  NODE, SENTINEL, RIGHT OBTURATOR, BIOPSY:  -  No carcinoma identified in one lymph node (0/1)  -  See comment   C. LYMPH NODE, RIGHT OBTURATOR, BIOPSY:  -  No carcinoma identified in one lymph node (0/1)  -  See comment   D. LYMPH NODE, SENTINEL, LEFT EXTERNAL ILIAC, BIOPSY:  -  No carcinoma identified in one lymph node (0/1)  -  See comment   E. UTERUS, CERVIX, BILATERAL FALLOPIAN TUBES, HYSTERECTOMY WITH  SALPINGECTOMY:  Uterus:  -  Endometrioid carcinoma, FIGO grade 1  -  Carcinoma confined to the endometrium  -  See oncology table and comment below   Cervix:  -  No carcinoma identified   Bilateral Fallopian tubes:  -  Benign fallopian tubes with paratubal cysts  -  No carcinoma identified   COMMENT:   A.  Dr. Vic Ripper reviewed the case and agrees with the above diagnosis.   B-D.  Cytokeratin AE1/3 was performed on the sentinel lymph nodes to  exclude micrometastasis.  There is no evidence of metastatic carcinoma  by immunohistochemistry.   ONCOLOGY TABLE:   UTERUS, CARCINOMA OR CARCINOSARCOMA   Procedure: Total hysterectomy and bilateral salpingectomy  Histologic type: Endometrioid carcinoma  Histologic Grade: FIGO grade 1  Myometrial invasion: Carcinoma limited to the endometrium  Uterine Serosa Involvement: Not identified  Cervical stromal involvement: Not identified  Extent of involvement of other organs: Not identified  Lymphovascular invasion: Not identified  Regional Lymph Nodes:       Examined:     3 Sentinel  0 non-sentinel                               3 total        Lymph nodes with metastasis: 0        Isolated tumor cells (<0.2 mm): 0        Micrometastasis:  (>0.2 mm and < 2.0 mm): 0        Macrometastasis: (>2.0 mm): 0        Extracapsular extension: N/A  Representative Tumor Block: E4  MMR / MSI testing: Will be ordered  Pathologic Stage Classification (pTNM, AJCC 8th edition):  pTIa, pN0  Comments: See above       Interval History: The patient reports overall doing well.  She is using narcotic pain medication only at night to help with pain and to help her sleep.  Otherwise she is using ibuprofen as needed during the day.  She endorses having a good appetite without any nausea or emesis.  She reports regular bowel and bladder function.  This morning, she noticed some mild erythema as well as increased tenderness around her left upper quadrant incision.  She denies any exudate.  She denies any vaginal bleeding or discharge.  She denies fevers or chills.  Past Medical/Surgical History: Past Medical History:  Diagnosis Date  . Anemia   . Carpal tunnel syndrome   . Carpal tunnel syndrome   . Complex atypical endometrial hyperplasia   . Depression   . Diverticulosis   . Generalized anxiety disorder   . GERD (gastroesophageal reflux disease)   . History of kidney stones   . Hypertension   . Migraines     Past Surgical History:  Procedure Laterality Date  . CHOLECYSTECTOMY    . ENDOMETRIAL BIOPSY  05/05/2019  . ROBOTIC ASSISTED TOTAL HYSTERECTOMY WITH BILATERAL SALPINGO OOPHERECTOMY N/A 06/17/2019   Procedure: XI ROBOTIC ASSISTED TOTAL HYSTERECTOMY WITH BILATERAL SALPINGECTOMY AND  LYMPH NODE DISSECTION;  Surgeon: Lafonda Mosses, MD;  Location: [facility];  Service: Gynecology;  Laterality: N/A;  . SENTINEL NODE BIOPSY N/A 06/17/2019   Procedure: SENTINEL NODE BIOPSY;  Surgeon: Lafonda Mosses, MD;  Location: [facility];  Service: Gynecology;  Laterality: N/A;    Family History  Problem Relation Age of Onset  . Hypertension Mother   . Diabetes Mother   . COPD Mother   . Thyroid disease Mother   . Thyroid disease Sister   . Thyroid disease Brother   . Throat cancer Maternal Grandfather   . Endometrial cancer Maternal Aunt     Social History   Socioeconomic History  . Marital status: Married    Spouse name: Not on file  . Number of children: Not  on file  . Years of education: Not on file  . Highest education level: Not on file  Occupational History  . Not on file  Tobacco Use  . Smoking status: Never Smoker  . Smokeless tobacco: Never Used  Substance and Sexual Activity  . Alcohol use: Never  . Drug use: Never  . Sexual activity: Yes  Other Topics Concern  . Not on file  Social History Narrative  . Not on file   Social Determinants of Health   Financial Resource Strain:   . Difficulty of Paying Living Expenses: Not on file  Food Insecurity:   . Worried About Charity fundraiser in the Last Year: Not on file  .  Ran Out of Food in the Last Year: Not on file  Transportation Needs:   . Lack of Transportation (Medical): Not on file  . Lack of Transportation (Non-Medical): Not on file  Physical Activity:   . Days of Exercise per Week: Not on file  . Minutes of Exercise per Session: Not on file  Stress:   . Feeling of Stress : Not on file  Social Connections:   . Frequency of Communication with Friends and Family: Not on file  . Frequency of Social Gatherings with Friends and Family: Not on file  . Attends Religious Services: Not on file  . Active Member of Clubs or Organizations: Not on file  . Attends Archivist Meetings: Not on file  . Marital Status: Not on file    Current Medications:  Current Outpatient Medications:  .  amLODipine (NORVASC) 5 MG tablet, Take 5 mg by mouth daily., Disp: , Rfl:  .  butalbital-acetaminophen-caffeine (FIORICET) 50-325-40 MG tablet, Take 1 tablet by mouth every 6 (six) hours as needed., Disp: , Rfl:  .  escitalopram (LEXAPRO) 10 MG tablet, Take 10 mg by mouth daily., Disp: , Rfl:  .  FEROSUL 325 (65 Fe) MG tablet, Take 325 mg by mouth daily., Disp: , Rfl:  .  ibuprofen (ADVIL) 800 MG tablet, Take 1 tablet (800 mg total) by mouth every 8 (eight) hours as needed for moderate pain. For AFTER surgery only, Disp: 30 tablet, Rfl: 0 .  loratadine (CLARITIN) 10 MG tablet, Take 10  mg by mouth daily., Disp: , Rfl:  .  losartan (COZAAR) 100 MG tablet, Take 100 mg by mouth daily., Disp: , Rfl:  .  omeprazole (PRILOSEC) 40 MG capsule, Take 40 mg by mouth daily as needed., Disp: , Rfl:  .  oxyCODONE (OXY IR/ROXICODONE) 5 MG immediate release tablet, Take 1 tablet (5 mg total) by mouth every 4 (four) hours as needed for severe pain. For AFTER surgery only, do not take and drive, Disp: 15 tablet, Rfl: 0 .  senna-docusate (SENOKOT-S) 8.6-50 MG tablet, Take 2 tablets by mouth at bedtime. For AFTER surgery, do not take if having loose stools, Disp: 30 tablet, Rfl: 1  Review of Symptoms: Pertinent positives as per HPI. Denies appetite changes, fevers, chills, fatigue, unexplained weight changes. Denies hearing loss, neck lumps or masses, mouth sores, ringing in ears or voice changes. Denies cough or wheezing.  Denies shortness of breath. Denies chest pain or palpitations. Denies leg swelling. Denies abdominal distention, pain, blood in stools, constipation, diarrhea, nausea, vomiting, or early satiety. Denies pain with intercourse, dysuria, frequency, hematuria or incontinence. Denies hot flashes, pelvic pain, vaginal bleeding or vaginal discharge.   Denies joint pain, back pain or muscle pain/cramps. Denies itching, rash, or wounds. Denies dizziness, headaches, numbness or seizures. Denies swollen lymph nodes or glands, denies easy bruising or bleeding. Denies anxiety, depression, confusion, or decreased concentration.  Physical Exam: LMP 06/15/2019  Not performed given limitations of this visit type.  Laboratory & Radiologic Studies: FINAL MICROSCOPIC DIAGNOSIS:   A. LYMPH NODE, SENTINEL, RIGHT EXTERNAL ILIAC, BIOPSY:  - Benign adipose tissue  - No lymphoid tissue identified   B. LYMPH NODE, SENTINEL, RIGHT OBTURATOR, BIOPSY:  - No carcinoma identified in one lymph node (0/1)  - See comment   C. LYMPH NODE, RIGHT OBTURATOR, BIOPSY:  - No carcinoma identified  in one lymph node (0/1)  - See comment   D. LYMPH NODE, SENTINEL, LEFT EXTERNAL ILIAC, BIOPSY:  - No carcinoma  identified in one lymph node (0/1)  - See comment   E. UTERUS, CERVIX, BILATERAL FALLOPIAN TUBES, HYSTERECTOMY WITH  SALPINGECTOMY:  Uterus:  - Endometrioid carcinoma, FIGO grade 1  - Carcinoma confined to the endometrium  - See oncology table and comment below   Cervix:  - No carcinoma identified   Bilateral Fallopian tubes:  - Benign fallopian tubes with paratubal cysts  - No carcinoma identified   COMMENT:   A. Dr. Vic Ripper reviewed the case and agrees with the above diagnosis.   B-D. Cytokeratin AE1/3 was performed on the sentinel lymph nodes to  exclude micrometastasis. There is no evidence of metastatic carcinoma  by immunohistochemistry.   ONCOLOGY TABLE:   UTERUS, CARCINOMA OR CARCINOSARCOMA   Procedure: Total hysterectomy and bilateral salpingectomy  Histologic type: Endometrioid carcinoma  Histologic Grade: FIGO grade 1  Myometrial invasion: Carcinoma limited to the endometrium  Uterine Serosa Involvement: Not identified  Cervical stromal involvement: Not identified  Extent of involvement of other organs: Not identified  Lymphovascular invasion: Not identified  Regional Lymph Nodes:    Examined:   3 Sentinel                0 non-sentinel                3 total     Lymph nodes with metastasis: 0     Isolated tumor cells (<0.2 mm): 0     Micrometastasis: (>0.2 mm and < 2.0 mm): 0    Macrometastasis: (>2.0 mm): 0     Extracapsular extension: N/A  Representative Tumor Block: E4  MMR / MSI testing: Will be ordered  Pathologic Stage Classification (pTNM, AJCC 8th edition): pTIa, pN0  Comments: See above   Assessment & Plan: Amber Cobb is a 44 y.o. 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  Patient is meeting  postoperative milestones and sounds to be healing well from surgery.  We discussed that the mild erythema and increased tenderness of one of her incisions may signal an early infection.  I have asked her to call back tomorrow morning if this worsens so that I can see her tomorrow in the clinic.  We also reviewed her final pathology.  Her uterine specimen showed a an early endometrial cancer, without myometrial invasion or lymphovascular space invasion.  Given low risk, early stage disease, we discussed that no adjuvant therapy is recommended.  Briefly outlined surveillance plan and we will discuss signs and symptoms concerning for recurrence at her in office appointment with me in several weeks.  I discussed the assessment and treatment plan with the patient. The patient was provided with an opportunity to ask questions and all were answered. The patient agreed with the plan and demonstrated an understanding of the instructions.   The patient was advised to call back or see an in-person evaluation if the symptoms worsen or if the condition fails to improve as anticipated.   12 minutes of total time was spent for this patient encounter, including preparation, face-to-face counseling with the patient and coordination of care, and documentation of the encounter.   Jeral Pinch, MD  Division of Gynecologic Oncology  Department of Obstetrics and Gynecology  [facility]

## 2019-06-23 ENCOUNTER — Ambulatory Visit: Payer: BLUE CROSS/BLUE SHIELD | Admitting: Gynecologic Oncology

## 2019-06-23 NOTE — Telephone Encounter (Addendum)
LM for Ms Halley to call the office to give an up date on her incision.

## 2019-06-23 NOTE — Telephone Encounter (Signed)
Amber Cobb called that office stating that her incision looks the same as yesterday.  She will continue to monitor the site and call if any changes.

## 2019-06-26 ENCOUNTER — Encounter (HOSPITAL_COMMUNITY): Payer: Self-pay | Admitting: Gynecologic Oncology

## 2019-07-07 NOTE — Progress Notes (Signed)
Gynecologic Oncology Return Clinic Visit  07/08/19  Reason for Visit: post-op visit and treatment discussion  Treatment History: Oncology History Overview Note  MSI-stable   Endometrial cancer ([place])  05/06/2019 Initial Biopsy   EMB: at least [facility]   05/21/2019 Imaging   CT abdomen/pelvis: Endometrial thickening, compatible with reported history of endometrial cancer.  No evidence of metastatic disease in the abdomen or pelvis.  7 mm hypodensity in the uncinate process, indeterminate and likely benign.  MRI of the abdomen with and without contrast could be used to further evaluate.   06/17/2019 Surgery   TRH/BS, SLN: On EUA, mobile 8-10 cm uterus, no adnexal masses. On intra-abdominal entry, normal appearing liver edge, diaphragm and omentum. Normal small and large bowel, appendix, and colon. Uterus 10cm and bulbous at the fundus. Normal appearing bilateral adnexa. Mapping successful to bilateral SLNs; external iliac SLN somewhat enlarged. No intra-abdominal or pelvic evidence of disease.    06/17/2019 Initial Diagnosis   Endometrial cancer ([place])   06/17/2019 Pathologic Stage   IA, grade 1, no LVSI, negative SLNs. IHC intact.  A. LYMPH NODE, SENTINEL, RIGHT EXTERNAL ILIAC, BIOPSY:  -  Benign adipose tissue  -  No lymphoid tissue identified   B. LYMPH NODE, SENTINEL, RIGHT OBTURATOR, BIOPSY:  -  No carcinoma identified in one lymph node (0/1)  -  See comment   C. LYMPH NODE, RIGHT OBTURATOR, BIOPSY:  -  No carcinoma identified in one lymph node (0/1)  -  See comment   D. LYMPH NODE, SENTINEL, LEFT EXTERNAL ILIAC, BIOPSY:  -  No carcinoma identified in one lymph node (0/1)  -  See comment   E. UTERUS, CERVIX, BILATERAL FALLOPIAN TUBES, HYSTERECTOMY WITH  SALPINGECTOMY:  Uterus:  -  Endometrioid carcinoma, FIGO grade 1  -  Carcinoma confined to the endometrium  -  See oncology table and comment below   Cervix:  -  No carcinoma identified   Bilateral Fallopian tubes:  -  Benign  fallopian tubes with paratubal cysts  -  No carcinoma identified   COMMENT:   A.  Dr. Vic Ripper reviewed the case and agrees with the above diagnosis.   B-D.  Cytokeratin AE1/3 was performed on the sentinel lymph nodes to  exclude micrometastasis.  There is no evidence of metastatic carcinoma  by immunohistochemistry.   ONCOLOGY TABLE:   UTERUS, CARCINOMA OR CARCINOSARCOMA   Procedure: Total hysterectomy and bilateral salpingectomy  Histologic type: Endometrioid carcinoma  Histologic Grade: FIGO grade 1  Myometrial invasion: Carcinoma limited to the endometrium  Uterine Serosa Involvement: Not identified  Cervical stromal involvement: Not identified  Extent of involvement of other organs: Not identified  Lymphovascular invasion: Not identified  Regional Lymph Nodes:       Examined:     3 Sentinel                               0 non-sentinel                               3 total        Lymph nodes with metastasis: 0        Isolated tumor cells (<0.2 mm): 0        Micrometastasis:  (>0.2 mm and < 2.0 mm): 0        Macrometastasis: (>2.0 mm): 0  Extracapsular extension: N/A  Representative Tumor Block: E4  MMR / MSI testing: Will be ordered  Pathologic Stage Classification (pTNM, AJCC 8th edition):  pTIa, pN0  Comments: See above      Interval History: Patient reports doing well since we spoke on the phone.  She denies any vaginal bleeding or discharge.  She reports regular bowel and bladder function although take something at baseline for constipation given her chronic narcotic use.  She reports erythema associated with one of her incisions resolved very quickly and she denies any incisional issues since.  She endorses having a good appetite without any nausea or emesis.  Past Medical/Surgical History: Past Medical History:  Diagnosis Date  . Anemia   . Carpal tunnel syndrome   . Carpal tunnel syndrome   . Complex atypical endometrial hyperplasia   . Depression    . Diverticulosis   . Generalized anxiety disorder   . GERD (gastroesophageal reflux disease)   . History of kidney stones   . Hypertension   . Migraines     Past Surgical History:  Procedure Laterality Date  . CHOLECYSTECTOMY    . ENDOMETRIAL BIOPSY  05/05/2019  . ROBOTIC ASSISTED TOTAL HYSTERECTOMY WITH BILATERAL SALPINGO OOPHERECTOMY N/A 06/17/2019   Procedure: XI ROBOTIC ASSISTED TOTAL HYSTERECTOMY WITH BILATERAL SALPINGECTOMY AND  LYMPH NODE DISSECTION;  Surgeon: Lafonda Mosses, MD;  Location: [facility];  Service: Gynecology;  Laterality: N/A;  . SENTINEL NODE BIOPSY N/A 06/17/2019   Procedure: SENTINEL NODE BIOPSY;  Surgeon: Lafonda Mosses, MD;  Location: [facility];  Service: Gynecology;  Laterality: N/A;    Family History  Problem Relation Age of Onset  . Hypertension Mother   . Diabetes Mother   . COPD Mother   . Thyroid disease Mother   . Thyroid disease Sister   . Thyroid disease Brother   . Throat cancer Maternal Grandfather   . Endometrial cancer Maternal Aunt     Social History   Socioeconomic History  . Marital status: Married    Spouse name: Not on file  . Number of children: Not on file  . Years of education: Not on file  . Highest education level: Not on file  Occupational History  . Not on file  Tobacco Use  . Smoking status: Never Smoker  . Smokeless tobacco: Never Used  Substance and Sexual Activity  . Alcohol use: Never  . Drug use: Never  . Sexual activity: Yes  Other Topics Concern  . Not on file  Social History Narrative  . Not on file   Social Determinants of Health   Financial Resource Strain:   . Difficulty of Paying Living Expenses: Not on file  Food Insecurity:   . Worried About Charity fundraiser in the Last Year: Not on file  . Ran Out of Food in the Last Year: Not on file  Transportation Needs:   . Lack of Transportation (Medical): Not on file  . Lack of Transportation (Non-Medical):  Not on file  Physical Activity:   . Days of Exercise per Week: Not on file  . Minutes of Exercise per Session: Not on file  Stress:   . Feeling of Stress : Not on file  Social Connections:   . Frequency of Communication with Friends and Family: Not on file  . Frequency of Social Gatherings with Friends and Family: Not on file  . Attends Religious Services: Not on file  . Active Member of Clubs or Organizations: Not  on file  . Attends Archivist Meetings: Not on file  . Marital Status: Not on file    Current Medications:  Current Outpatient Medications:  .  amLODipine (NORVASC) 5 MG tablet, Take 5 mg by mouth daily., Disp: , Rfl:  .  butalbital-acetaminophen-caffeine (FIORICET) 50-325-40 MG tablet, Take 1 tablet by mouth every 6 (six) hours as needed., Disp: , Rfl:  .  escitalopram (LEXAPRO) 10 MG tablet, Take 10 mg by mouth daily., Disp: , Rfl:  .  FEROSUL 325 (65 Fe) MG tablet, Take 325 mg by mouth daily., Disp: , Rfl:  .  HYDROcodone-acetaminophen (NORCO/VICODIN) 5-325 MG tablet, Take 1 tablet by mouth every 6 (six) hours as needed. for pain, Disp: , Rfl:  .  ibuprofen (ADVIL) 800 MG tablet, Take 1 tablet (800 mg total) by mouth every 8 (eight) hours as needed for moderate pain. For AFTER surgery only, Disp: 30 tablet, Rfl: 0 .  ibuprofen (ADVIL) 800 MG tablet, Take by mouth., Disp: , Rfl:  .  loratadine (CLARITIN) 10 MG tablet, Take 10 mg by mouth daily., Disp: , Rfl:  .  losartan (COZAAR) 100 MG tablet, Take 100 mg by mouth daily., Disp: , Rfl:  .  omeprazole (PRILOSEC) 40 MG capsule, Take 40 mg by mouth daily as needed., Disp: , Rfl:   Review of Systems: Denies appetite changes, fevers, chills, fatigue, unexplained weight changes. Denies hearing loss, neck lumps or masses, mouth sores, ringing in ears or voice changes. Denies cough or wheezing.  Denies shortness of breath. Denies chest pain or palpitations. Denies leg swelling. Denies abdominal distention, pain, blood  in stools, constipation, diarrhea, nausea, vomiting, or early satiety. Denies pain with intercourse, dysuria, frequency, hematuria or incontinence. Denies hot flashes, pelvic pain, vaginal bleeding or vaginal discharge.   Denies joint pain, back pain or muscle pain/cramps. Denies itching, rash, or wounds. Denies dizziness, headaches, numbness or seizures. Denies swollen lymph nodes or glands, denies easy bruising or bleeding. Denies anxiety, depression, confusion, or decreased concentration.  Physical Exam: BP 129/84 (BP Location: Left Arm, Patient Position: Sitting)   Pulse 85   Temp 98.2 F (36.8 C) (Temporal)   Resp 17   Ht _0  (1.702 m)   Wt 250 lb 9.6 oz (113.7 kg)   LMP 06/15/2019   SpO2 99%   BMI 39.25 kg/m  General: Alert, oriented, no acute distress. HEENT: Normocephalic, atraumatic, sclera anicteric. Chest: Clear to auscultation bilaterally.  No wheezes or rhonchi. Cardiovascular: Regular rate and rhythm, no murmurs. Abdomen: Obese, soft, nontender.  Normoactive bowel sounds.  No masses or hepatosplenomegaly appreciated.  Well-healing laparoscopic incisions, some suture visible that was removed today. Extremities: Grossly normal range of motion.  Warm, well perfused.  No edema bilaterally. Skin: No rashes or lesions noted. GU: Normal appearing external genitalia without erythema, excoriation, or lesions.  Speculum exam reveals cuff intact, no bleeding or discharge.  Bimanual exam reveals cuff intact, no fluctuance or tenderness with palpation.  Laboratory & Radiologic Studies: None new  Assessment & Plan: Amber Cobb is a 44 y.o. woman with Stage IA, grade 1 endometrioid endometrial adenocarcinoma, MSI stable, who presents for follow-up after surgery.  The patient is healing well from surgery.  We discussed continued activity restrictions until she is 6 weeks out and nothing in the vagina until 8 weeks.    Per SGO surveillance recommendations, we discussed plan for  visits every 6 months for 2 years and then yearly visits until 5 years out from treatment.  We also reviewed signs and symptoms concerning for recurrence that should prompt a phone call to clinic prior to her next visit with me.  20 minutes of total time was spent for this patient encounter, including preparation, face-to-face counseling with the patient and coordination of care, and documentation of the encounter.  Jeral Pinch, MD  Division of Gynecologic Oncology  Department of Obstetrics and Gynecology  [facility]

## 2019-07-08 ENCOUNTER — Encounter: Payer: Self-pay | Admitting: Gynecologic Oncology

## 2019-07-08 ENCOUNTER — Other Ambulatory Visit: Payer: Self-pay

## 2019-07-08 ENCOUNTER — Inpatient Hospital Stay (HOSPITAL_BASED_OUTPATIENT_CLINIC_OR_DEPARTMENT_OTHER): Payer: BC Managed Care – PPO | Admitting: Gynecologic Oncology

## 2019-07-08 VITALS — BP 129/84 | HR 85 | Temp 98.2°F | Resp 17 | Ht 67.0 in | Wt 250.6 lb

## 2019-07-08 DIAGNOSIS — Z90710 Acquired absence of both cervix and uterus: Secondary | ICD-10-CM

## 2019-07-08 DIAGNOSIS — Z90722 Acquired absence of ovaries, bilateral: Secondary | ICD-10-CM

## 2019-07-08 DIAGNOSIS — C541 Malignant neoplasm of endometrium: Secondary | ICD-10-CM

## 2019-07-08 NOTE — Patient Instructions (Signed)
You are healing very well from surgery!  Remember activity restrictions are in place until you are 6 weeks out and nothing in the vagina until 8 weeks.  Please call back in July to get your 58-month follow-up visit scheduled.

## 2019-07-21 ENCOUNTER — Telehealth: Payer: Self-pay

## 2019-07-21 NOTE — Telephone Encounter (Signed)
  Faxed return to work certification to [employer] and e-mailed a copy to Ms. Stephanie's [email or web address].

## 2019-07-23 ENCOUNTER — Telehealth: Payer: Self-pay | Admitting: *Deleted

## 2019-07-23 NOTE — Telephone Encounter (Signed)
refaxed FMLA/disabilty forms and office note per request

## 2019-10-02 ENCOUNTER — Telehealth: Payer: Self-pay | Admitting: *Deleted

## 2019-10-02 NOTE — Telephone Encounter (Signed)
Patient called and stated "I know I am to have an appt on July but, I am changing insurance soon. I need an appt in the next two weeks. Appt scheduled

## 2019-10-05 NOTE — Progress Notes (Deleted)
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  No evidence of metastatic disease in the abdomen or pelvis.  7 mm hypodensity in the uncinate process, indeterminate and likely benign.  MRI of the abdomen with and without contrast could be used to further evaluate.   06/17/2019 Surgery   TRH/BS, SLN: On EUA, mobile 8-10 cm uterus, no adnexal masses. On intra-abdominal entry, normal appearing liver edge, diaphragm and omentum. Normal small and large bowel, appendix, and colon. Uterus 10cm and bulbous at the fundus. Normal appearing bilateral adnexa. Mapping successful to bilateral SLNs; external iliac SLN somewhat enlarged. No intra-abdominal or pelvic evidence of disease.    06/17/2019 Initial Diagnosis   Endometrial cancer ([place])   06/17/2019 Pathologic Stage   IA, grade 1, no LVSI, negative SLNs. IHC intact.  A. LYMPH NODE, SENTINEL, RIGHT EXTERNAL ILIAC, BIOPSY:  -  Benign adipose tissue  -  No lymphoid tissue identified   B. LYMPH NODE, SENTINEL, RIGHT OBTURATOR, BIOPSY:  -  No carcinoma identified in one lymph node (0/1)  -  See comment   C. LYMPH NODE, RIGHT OBTURATOR, BIOPSY:  -  No carcinoma identified in one lymph node (0/1)  -  See comment   D. LYMPH NODE, SENTINEL, LEFT EXTERNAL ILIAC, BIOPSY:  -  No carcinoma identified in one lymph node (0/1)  -  See comment   E. UTERUS, CERVIX, BILATERAL FALLOPIAN TUBES, HYSTERECTOMY WITH  SALPINGECTOMY:  Uterus:  -  Endometrioid carcinoma, FIGO grade 1  -  Carcinoma confined to the endometrium  -  See oncology table and comment below   Cervix:  -  No carcinoma identified    Bilateral Fallopian tubes:  -  Benign fallopian tubes with paratubal cysts  -  No carcinoma identified   COMMENT:   A.  Dr. Vic Ripper reviewed the case and agrees with the above diagnosis.   B-D.  Cytokeratin AE1/3 was performed on the sentinel lymph nodes to  exclude micrometastasis.  There is no evidence of metastatic carcinoma  by immunohistochemistry.   ONCOLOGY TABLE:   UTERUS, CARCINOMA OR CARCINOSARCOMA   Procedure: Total hysterectomy and bilateral salpingectomy  Histologic type: Endometrioid carcinoma  Histologic Grade: FIGO grade 1  Myometrial invasion: Carcinoma limited to the endometrium  Uterine Serosa Involvement: Not identified  Cervical stromal involvement: Not identified  Extent of involvement of other organs: Not identified  Lymphovascular invasion: Not identified  Regional Lymph Nodes:       Examined:     3 Sentinel                               0 non-sentinel                               3 total        Lymph nodes with metastasis: 0        Isolated tumor cells (<0.2 mm): 0        Micrometastasis:  (>0.2 mm and < 2.0 mm): 0        Macrometastasis: (>2.0 mm): 0  Extracapsular extension: N/A  Representative Tumor Block: E4  MMR / MSI testing: Will be ordered  Pathologic Stage Classification (pTNM, AJCC 8th edition):  pTIa, pN0  Comments: See above      Interval History: ***  Past Medical/Surgical History: Past Medical History:  Diagnosis Date  . Anemia   . Carpal tunnel syndrome   . Carpal tunnel syndrome   . Complex atypical endometrial hyperplasia   . Depression   . Diverticulosis   . Generalized anxiety disorder   . GERD (gastroesophageal reflux disease)   . History of kidney stones   . Hypertension   . Migraines     Past Surgical History:  Procedure Laterality Date  . CHOLECYSTECTOMY    . ENDOMETRIAL BIOPSY  05/05/2019  . ROBOTIC ASSISTED TOTAL HYSTERECTOMY WITH BILATERAL SALPINGO OOPHERECTOMY N/A 06/17/2019   Procedure: XI  ROBOTIC ASSISTED TOTAL HYSTERECTOMY WITH BILATERAL SALPINGECTOMY AND  LYMPH NODE DISSECTION;  Surgeon: Lafonda Mosses, MD;  Location: [facility];  Service: Gynecology;  Laterality: N/A;  . SENTINEL NODE BIOPSY N/A 06/17/2019   Procedure: SENTINEL NODE BIOPSY;  Surgeon: Lafonda Mosses, MD;  Location: [facility];  Service: Gynecology;  Laterality: N/A;    Family History  Problem Relation Age of Onset  . Hypertension Mother   . Diabetes Mother   . COPD Mother   . Thyroid disease Mother   . Thyroid disease Sister   . Thyroid disease Brother   . Throat cancer Maternal Grandfather   . Endometrial cancer Maternal Aunt     Social History   Socioeconomic History  . Marital status: Married    Spouse name: Not on file  . Number of children: Not on file  . Years of education: Not on file  . Highest education level: Not on file  Occupational History  . Not on file  Tobacco Use  . Smoking status: Never Smoker  . Smokeless tobacco: Never Used  Substance and Sexual Activity  . Alcohol use: Never  . Drug use: Never  . Sexual activity: Yes  Other Topics Concern  . Not on file  Social History Narrative  . Not on file   Social Determinants of Health   Financial Resource Strain:   . Difficulty of Paying Living Expenses:   Food Insecurity:   . Worried About Charity fundraiser in the Last Year:   . Arboriculturist in the Last Year:   Transportation Needs:   . Film/video editor (Medical):   Marland Kitchen Lack of Transportation (Non-Medical):   Physical Activity:   . Days of Exercise per Week:   . Minutes of Exercise per Session:   Stress:   . Feeling of Stress :   Social Connections:   . Frequency of Communication with Friends and Family:   . Frequency of Social Gatherings with Friends and Family:   . Attends Religious Services:   . Active Member of Clubs or Organizations:   . Attends Archivist Meetings:   Marland Kitchen Marital Status:      Current Medications:  Current Outpatient Medications:  .  amLODipine (NORVASC) 5 MG tablet, Take 5 mg by mouth daily., Disp: , Rfl:  .  butalbital-acetaminophen-caffeine (FIORICET) 50-325-40 MG tablet, Take 1 tablet by mouth every 6 (six) hours as needed., Disp: , Rfl:  .  escitalopram (LEXAPRO) 10 MG tablet, Take 10 mg by mouth daily., Disp: , Rfl:  .  FEROSUL 325 (65 Fe) MG tablet, Take 325 mg by  mouth daily., Disp: , Rfl:  .  HYDROcodone-acetaminophen (NORCO/VICODIN) 5-325 MG tablet, Take 1 tablet by mouth every 6 (six) hours as needed. for pain, Disp: , Rfl:  .  ibuprofen (ADVIL) 800 MG tablet, Take 1 tablet (800 mg total) by mouth every 8 (eight) hours as needed for moderate pain. For AFTER surgery only, Disp: 30 tablet, Rfl: 0 .  ibuprofen (ADVIL) 800 MG tablet, Take by mouth., Disp: , Rfl:  .  loratadine (CLARITIN) 10 MG tablet, Take 10 mg by mouth daily., Disp: , Rfl:  .  losartan (COZAAR) 100 MG tablet, Take 100 mg by mouth daily., Disp: , Rfl:  .  omeprazole (PRILOSEC) 40 MG capsule, Take 40 mg by mouth daily as needed., Disp: , Rfl:   Review of Systems: Denies appetite changes, fevers, chills, fatigue, unexplained weight changes. Denies hearing loss, neck lumps or masses, mouth sores, ringing in ears or voice changes. Denies cough or wheezing.  Denies shortness of breath. Denies chest pain or palpitations. Denies leg swelling. Denies abdominal distention, pain, blood in stools, constipation, diarrhea, nausea, vomiting, or early satiety. Denies pain with intercourse, dysuria, frequency, hematuria or incontinence. Denies hot flashes, pelvic pain, vaginal bleeding or vaginal discharge.   Denies joint pain, back pain or muscle pain/cramps. Denies itching, rash, or wounds. Denies dizziness, headaches, numbness or seizures. Denies swollen lymph nodes or glands, denies easy bruising or bleeding. Denies anxiety, depression, confusion, or decreased concentration.  Physical  Exam: There were no vitals taken for this visit. General: ***Alert, oriented, no acute distress. HEENT: ***Posterior oropharynx clear, sclera anicteric. Chest: ***Clear to auscultation bilaterally.  ***Port site clean. Cardiovascular: ***Regular rate and rhythm, no murmurs. Abdomen: ***Obese, soft, nontender.  Normoactive bowel sounds.  No masses or hepatosplenomegaly appreciated.  ***Well-healed scar. Extremities: ***Grossly normal range of motion.  Warm, well perfused.  No edema bilaterally. Skin: ***No rashes or lesions noted. Lymphatics: ***No cervical, supraclavicular, or inguinal adenopathy. GU: Normal appearing external genitalia without erythema, excoriation, or lesions.  Speculum exam reveals ***.  Bimanual exam reveals ***.  ***Rectovaginal exam  confirms ___.  Laboratory & Radiologic Studies: ***  Assessment & Plan: Amber Cobb is a 44 y.o. woman with Stage IA, grade 1 endometrioid endometrial adenocarcinoma, MSI stable, who presents for follow-up ***  The patient is healing well from surgery.  We discussed continued activity restrictions until she is 6 weeks out and nothing in the vagina until 8 weeks.    Per SGO surveillance recommendations, we discussed plan for visits every 6 months for 2 years and then yearly visits until 5 years out from treatment.  We also reviewed signs and symptoms concerning for recurrence that should prompt a phone call to clinic prior to her next visit with me.  ***  *** minutes of total time was spent for this patient encounter, including preparation, face-to-face counseling with the patient and coordination of care, and documentation of the encounter.  Jeral Pinch, MD  Division of Gynecologic Oncology  Department of Obstetrics and Gynecology  [facility]

## 2019-10-09 ENCOUNTER — Inpatient Hospital Stay: Payer: BC Managed Care – PPO | Admitting: Gynecologic Oncology

## 2019-10-29 NOTE — Progress Notes (Signed)
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  No evidence of metastatic disease in the abdomen or pelvis.  7 mm hypodensity in the uncinate process, indeterminate and likely benign.  MRI of the abdomen with and without contrast could be used to further evaluate.   06/17/2019 Surgery   TRH/BS, SLN: On EUA, mobile 8-10 cm uterus, no adnexal masses. On intra-abdominal entry, normal appearing liver edge, diaphragm and omentum. Normal small and large bowel, appendix, and colon. Uterus 10cm and bulbous at the fundus. Normal appearing bilateral adnexa. Mapping successful to bilateral SLNs; external iliac SLN somewhat enlarged. No intra-abdominal or pelvic evidence of disease.    06/17/2019 Initial Diagnosis   Endometrial cancer ([place])   06/17/2019 Pathologic Stage   IA, grade 1, no LVSI, negative SLNs. IHC intact.  A. LYMPH NODE, SENTINEL, RIGHT EXTERNAL ILIAC, BIOPSY:  -  Benign adipose tissue  -  No lymphoid tissue identified   B. LYMPH NODE, SENTINEL, RIGHT OBTURATOR, BIOPSY:  -  No carcinoma identified in one lymph node (0/1)  -  See comment   C. LYMPH NODE, RIGHT OBTURATOR, BIOPSY:  -  No carcinoma identified in one lymph node (0/1)  -  See comment   D. LYMPH NODE, SENTINEL, LEFT EXTERNAL ILIAC, BIOPSY:  -  No carcinoma identified in one lymph node (0/1)  -  See comment   E. UTERUS, CERVIX, BILATERAL FALLOPIAN TUBES, HYSTERECTOMY WITH  SALPINGECTOMY:  Uterus:  -  Endometrioid carcinoma, FIGO grade 1  -  Carcinoma confined to the endometrium  -  See oncology table and comment below   Cervix:  -  No carcinoma identified    Bilateral Fallopian tubes:  -  Benign fallopian tubes with paratubal cysts  -  No carcinoma identified   COMMENT:   A.  Dr. Vic Ripper reviewed the case and agrees with the above diagnosis.   B-D.  Cytokeratin AE1/3 was performed on the sentinel lymph nodes to  exclude micrometastasis.  There is no evidence of metastatic carcinoma  by immunohistochemistry.   ONCOLOGY TABLE:   UTERUS, CARCINOMA OR CARCINOSARCOMA   Procedure: Total hysterectomy and bilateral salpingectomy  Histologic type: Endometrioid carcinoma  Histologic Grade: FIGO grade 1  Myometrial invasion: Carcinoma limited to the endometrium  Uterine Serosa Involvement: Not identified  Cervical stromal involvement: Not identified  Extent of involvement of other organs: Not identified  Lymphovascular invasion: Not identified  Regional Lymph Nodes:       Examined:     3 Sentinel                               0 non-sentinel                               3 total        Lymph nodes with metastasis: 0        Isolated tumor cells (<0.2 mm): 0        Micrometastasis:  (>0.2 mm and < 2.0 mm): 0        Macrometastasis: (>2.0 mm): 0  Extracapsular extension: N/A  Representative Tumor Block: E4  MMR / MSI testing: Will be ordered  Pathologic Stage Classification (pTNM, AJCC 8th edition):  pTIa, pN0  Comments: See above      Interval History: Patient reports overall doing well since her last visit.  She just found out that she is going to be a grandmother as her son who recently got married found out that he and his wife are having a baby.  She recently saw her nurse practitioner with her PCPs office.  They discussed getting her scheduled for a mammogram.  She denies any vaginal discharge or bleeding.  She has gained about 20 pounds in the last 5-6 months.  She is interested in losing some weight.  She denies any pelvic or abdominal pain.  She is looking forward to seeing [facility] this fall with her daughter.  Past  Medical/Surgical History: Past Medical History:  Diagnosis Date  . Anemia   . Carpal tunnel syndrome   . Carpal tunnel syndrome   . Complex atypical endometrial hyperplasia   . Depression   . Diverticulosis   . Generalized anxiety disorder   . GERD (gastroesophageal reflux disease)   . History of kidney stones   . Hypertension   . Migraines     Past Surgical History:  Procedure Laterality Date  . CHOLECYSTECTOMY    . ENDOMETRIAL BIOPSY  05/05/2019  . ROBOTIC ASSISTED TOTAL HYSTERECTOMY WITH BILATERAL SALPINGO OOPHERECTOMY N/A 06/17/2019   Procedure: XI ROBOTIC ASSISTED TOTAL HYSTERECTOMY WITH BILATERAL SALPINGECTOMY AND  LYMPH NODE DISSECTION;  Surgeon: Lafonda Mosses, MD;  Location: [facility];  Service: Gynecology;  Laterality: N/A;  . SENTINEL NODE BIOPSY N/A 06/17/2019   Procedure: SENTINEL NODE BIOPSY;  Surgeon: Lafonda Mosses, MD;  Location: [facility];  Service: Gynecology;  Laterality: N/A;    Family History  Problem Relation Age of Onset  . Hypertension Mother   . Diabetes Mother   . COPD Mother   . Thyroid disease Mother   . Thyroid disease Sister   . Thyroid disease Brother   . Throat cancer Maternal Grandfather   . Endometrial cancer Maternal Aunt     Social History   Socioeconomic History  . Marital status: Married    Spouse name: Not on file  . Number of children: Not on file  . Years of education: Not on file  . Highest education level: Not on file  Occupational History  . Not on file  Tobacco Use  . Smoking status: Never Smoker  . Smokeless tobacco: Never Used  Vaping Use  . Vaping Use: Never used  Substance and Sexual Activity  . Alcohol use: Never  . Drug use: Never  . Sexual activity: Yes  Other Topics Concern  . Not on file  Social History Narrative  . Not on file   Social Determinants of Health   Financial Resource Strain:   . Difficulty of Paying Living Expenses:   Food Insecurity:   .  Worried About Charity fundraiser in the Last Year:   . Arboriculturist in the Last Year:   Transportation Needs:   . Film/video editor (Medical):   Marland Kitchen Lack of Transportation (Non-Medical):   Physical Activity:   . Days of Exercise per Week:   . Minutes of Exercise per Session:   Stress:   . Feeling of Stress :   Social Connections:   . Frequency of Communication with Friends and Family:   .  Frequency of Social Gatherings with Friends and Family:   . Attends Religious Services:   . Active Member of Clubs or Organizations:   . Attends Banker Meetings:   Marland Kitchen Marital Status:     Current Medications:  Current Outpatient Medications:  .  amLODipine (NORVASC) 5 MG tablet, Take 5 mg by mouth daily., Disp: , Rfl:  .  butalbital-acetaminophen-caffeine (FIORICET) 50-325-40 MG tablet, Take 1 tablet by mouth every 6 (six) hours as needed., Disp: , Rfl:  .  escitalopram (LEXAPRO) 10 MG tablet, Take 10 mg by mouth daily., Disp: , Rfl:  .  FEROSUL 325 (65 Fe) MG tablet, Take 325 mg by mouth daily., Disp: , Rfl:  .  HYDROcodone-acetaminophen (NORCO/VICODIN) 5-325 MG tablet, Take 1 tablet by mouth every 6 (six) hours as needed. for pain, Disp: , Rfl:  .  ibuprofen (ADVIL) 800 MG tablet, Take 1 tablet (800 mg total) by mouth every 8 (eight) hours as needed for moderate pain. For AFTER surgery only, Disp: 30 tablet, Rfl: 0 .  ibuprofen (ADVIL) 800 MG tablet, Take by mouth., Disp: , Rfl:  .  loratadine (CLARITIN) 10 MG tablet, Take 10 mg by mouth daily., Disp: , Rfl:  .  losartan (COZAAR) 100 MG tablet, Take 100 mg by mouth daily., Disp: , Rfl:  .  omeprazole (PRILOSEC) 40 MG capsule, Take 40 mg by mouth daily as needed., Disp: , Rfl:   Review of Systems: Denies appetite changes, fevers, chills, fatigue, unexplained weight changes. Denies hearing loss, neck lumps or masses, mouth sores, ringing in ears or voice changes. Denies cough or wheezing.  Denies shortness of breath. Denies  chest pain or palpitations. Denies leg swelling. Denies abdominal distention, pain, blood in stools, constipation, diarrhea, nausea, vomiting, or early satiety. Denies pain with intercourse, dysuria, frequency, hematuria or incontinence. Denies hot flashes, pelvic pain, vaginal bleeding or vaginal discharge.   Denies joint pain, back pain or muscle pain/cramps. Denies itching, rash, or wounds. Denies dizziness, headaches, numbness or seizures. Denies swollen lymph nodes or glands, denies easy bruising or bleeding. Denies anxiety, depression, confusion, or decreased concentration.  Physical Exam: There were no vitals taken for this visit. General: Alert, oriented, no acute distress. HEENT: Normocephalic, atraumatic, sclera anicteric. Chest: Unlabored breathing on room air. Abdomen: Obese, soft, nontender.  Normoactive bowel sounds.  No masses or hepatosplenomegaly appreciated.  Well-healing incisions. Extremities: Grossly normal range of motion.  Warm, well perfused.  No edema bilaterally. Skin: No rashes or lesions noted. Lymphatics: No cervical, supraclavicular, or inguinal adenopathy. GU: Normal appearing external genitalia without erythema, excoriation, or lesions.  Speculum exam reveals intact cuff, 1 cm area of red polypoid looking tissue that appears to be granulation tissue in the midportion of the cuff.  With the patient's verbal permission, this area was cleansed with Betadine x3 and a biopsy was taken and placed in formalin.  Silver nitrate was used to achieve hemostasis.  Bimanual exam reveals cuff intact, no masses or nodularity.  Rectovaginal exam confirms these findings.  Laboratory & Radiologic Studies: None new  Assessment & Plan: Amber Cobb is a 44 y.o. woman with Stage IA, grade 1 endometrioid endometrial adenocarcinoma, MSI stable, who presents for follow-up with what I suspect to be granulation tissue on her exam.  Overall the patient is doing well without any  issues.  Lesion biopsied along the cuff that I suspect is granulation tissue.  Will release these results to her in my chart unless we need to discuss in which  case I will call her by phone.  Per SGO surveillance recommendations, we discussed plan for visits every 6 months for 2 years and then yearly visits until 5 years out from treatment.  We also reviewed signs and symptoms concerning for recurrence that should prompt a phone call to clinic prior to her next visit with me.  Patient is motivated to work on weight loss.  We discussed again the importance of weight loss both for her overall health but also to decrease her risk of cancer recurrence.  She understands the role that excess estrogen played in the development of her cancer.  She will work on dietary changes and exercise.  If at her next visit she is struggling to lose weight, we discussed referral to a nutritionist.  She and I used a BMI calculator to determine her weight loss goal, which ideally would be in the 160 range.  She is going to work on losing 25 pounds in the next 6 months when she sees me again.  22 minutes of total time was spent for this patient encounter, including preparation, face-to-face counseling with the patient and coordination of care, and documentation of the encounter.  Jeral Pinch, MD  Division of Gynecologic Oncology  Department of Obstetrics and Gynecology  [facility]

## 2019-10-30 ENCOUNTER — Other Ambulatory Visit: Payer: Self-pay

## 2019-10-30 ENCOUNTER — Encounter: Payer: Self-pay | Admitting: Gynecologic Oncology

## 2019-10-30 ENCOUNTER — Inpatient Hospital Stay: Payer: BC Managed Care – PPO | Attending: Gynecologic Oncology | Admitting: Gynecologic Oncology

## 2019-10-30 VITALS — BP 123/67 | HR 58 | Temp 98.2°F | Resp 16 | Ht 67.0 in | Wt 257.1 lb

## 2019-10-30 DIAGNOSIS — A58 Granuloma inguinale: Secondary | ICD-10-CM

## 2019-10-30 DIAGNOSIS — Z90710 Acquired absence of both cervix and uterus: Secondary | ICD-10-CM | POA: Diagnosis not present

## 2019-10-30 DIAGNOSIS — Z808 Family history of malignant neoplasm of other organs or systems: Secondary | ICD-10-CM

## 2019-10-30 DIAGNOSIS — E669 Obesity, unspecified: Secondary | ICD-10-CM | POA: Diagnosis not present

## 2019-10-30 DIAGNOSIS — Z90722 Acquired absence of ovaries, bilateral: Secondary | ICD-10-CM

## 2019-10-30 DIAGNOSIS — C541 Malignant neoplasm of endometrium: Secondary | ICD-10-CM

## 2019-10-30 NOTE — Addendum Note (Signed)
Addended by: Joylene John D on: 10/30/2019 01:04 PM   Modules accepted: Orders

## 2019-10-30 NOTE — Patient Instructions (Signed)
I will release your biopsy results when I get them back next week.  Besides the area I biopsy, your exam was completely normal!  I will see you in December unless you develop any symptoms such as vaginal bleeding or pelvic pain before then.

## 2019-11-02 LAB — SURGICAL PATHOLOGY

## 2020-04-22 ENCOUNTER — Encounter: Payer: Self-pay | Admitting: Gynecologic Oncology

## 2020-04-25 ENCOUNTER — Encounter: Payer: Self-pay | Admitting: Gynecologic Oncology

## 2020-04-25 ENCOUNTER — Inpatient Hospital Stay: Payer: 59 | Attending: Gynecologic Oncology | Admitting: Gynecologic Oncology

## 2020-04-25 ENCOUNTER — Other Ambulatory Visit: Payer: Self-pay

## 2020-04-25 VITALS — BP 126/70 | HR 63 | Temp 96.8°F | Resp 18 | Ht 67.0 in | Wt 243.0 lb

## 2020-04-25 DIAGNOSIS — F411 Generalized anxiety disorder: Secondary | ICD-10-CM | POA: Insufficient documentation

## 2020-04-25 DIAGNOSIS — F32A Depression, unspecified: Secondary | ICD-10-CM | POA: Diagnosis not present

## 2020-04-25 DIAGNOSIS — Z90722 Acquired absence of ovaries, bilateral: Secondary | ICD-10-CM | POA: Insufficient documentation

## 2020-04-25 DIAGNOSIS — Z9079 Acquired absence of other genital organ(s): Secondary | ICD-10-CM | POA: Insufficient documentation

## 2020-04-25 DIAGNOSIS — Z90710 Acquired absence of both cervix and uterus: Secondary | ICD-10-CM | POA: Insufficient documentation

## 2020-04-25 DIAGNOSIS — K219 Gastro-esophageal reflux disease without esophagitis: Secondary | ICD-10-CM | POA: Insufficient documentation

## 2020-04-25 DIAGNOSIS — C541 Malignant neoplasm of endometrium: Secondary | ICD-10-CM | POA: Diagnosis not present

## 2020-04-25 DIAGNOSIS — Z79899 Other long term (current) drug therapy: Secondary | ICD-10-CM | POA: Diagnosis not present

## 2020-04-25 DIAGNOSIS — I10 Essential (primary) hypertension: Secondary | ICD-10-CM | POA: Insufficient documentation

## 2020-04-25 NOTE — Progress Notes (Signed)
Gynecologic Oncology Return Clinic Visit  04/25/2020  Reason for Visit: Surveillance visit in the setting of early stage low risk endometrial cancer  Treatment History: Oncology History Overview Note  MSI-stable   Endometrial cancer ([place])  05/06/2019 Initial Biopsy   EMB: at least [facility]   05/21/2019 Imaging   CT abdomen/pelvis: Endometrial thickening, compatible with reported history of endometrial cancer.  No evidence of metastatic disease in the abdomen or pelvis.  7 mm hypodensity in the uncinate process, indeterminate and likely benign.  MRI of the abdomen with and without contrast could be used to further evaluate.   06/17/2019 Surgery   TRH/BS, SLN: On EUA, mobile 8-10 cm uterus, no adnexal masses. On intra-abdominal entry, normal appearing liver edge, diaphragm and omentum. Normal small and large bowel, appendix, and colon. Uterus 10cm and bulbous at the fundus. Normal appearing bilateral adnexa. Mapping successful to bilateral SLNs; external iliac SLN somewhat enlarged. No intra-abdominal or pelvic evidence of disease.    06/17/2019 Initial Diagnosis   Endometrial cancer ([place])   06/17/2019 Pathologic Stage   IA, grade 1, no LVSI, negative SLNs. IHC intact.  A. LYMPH NODE, SENTINEL, RIGHT EXTERNAL ILIAC, BIOPSY:  -  Benign adipose tissue  -  No lymphoid tissue identified   B. LYMPH NODE, SENTINEL, RIGHT OBTURATOR, BIOPSY:  -  No carcinoma identified in one lymph node (0/1)  -  See comment   C. LYMPH NODE, RIGHT OBTURATOR, BIOPSY:  -  No carcinoma identified in one lymph node (0/1)  -  See comment   D. LYMPH NODE, SENTINEL, LEFT EXTERNAL ILIAC, BIOPSY:  -  No carcinoma identified in one lymph node (0/1)  -  See comment   E. UTERUS, CERVIX, BILATERAL FALLOPIAN TUBES, HYSTERECTOMY WITH  SALPINGECTOMY:  Uterus:  -  Endometrioid carcinoma, FIGO grade 1  -  Carcinoma confined to the endometrium  -  See oncology table and comment below   Cervix:  -  No carcinoma identified    Bilateral Fallopian tubes:  -  Benign fallopian tubes with paratubal cysts  -  No carcinoma identified   COMMENT:   A.  Dr. Vic Ripper reviewed the case and agrees with the above diagnosis.   B-D.  Cytokeratin AE1/3 was performed on the sentinel lymph nodes to  exclude micrometastasis.  There is no evidence of metastatic carcinoma  by immunohistochemistry.   ONCOLOGY TABLE:   UTERUS, CARCINOMA OR CARCINOSARCOMA   Procedure: Total hysterectomy and bilateral salpingectomy  Histologic type: Endometrioid carcinoma  Histologic Grade: FIGO grade 1  Myometrial invasion: Carcinoma limited to the endometrium  Uterine Serosa Involvement: Not identified  Cervical stromal involvement: Not identified  Extent of involvement of other organs: Not identified  Lymphovascular invasion: Not identified  Regional Lymph Nodes:       Examined:     3 Sentinel                               0 non-sentinel                               3 total        Lymph nodes with metastasis: 0        Isolated tumor cells (<0.2 mm): 0        Micrometastasis:  (>0.2 mm and < 2.0 mm): 0        Macrometastasis: (>2.0 mm): 0  Extracapsular extension: N/A  Representative Tumor Block: E4  MMR / MSI testing: Will be ordered  Pathologic Stage Classification (pTNM, AJCC 8th edition):  pTIa, pN0  Comments: See above      Interval History: Since her last visit, reports things are going well.  She denies any vaginal bleeding or discharge.  She endorses a good appetite without nausea or emesis.  She reports normal bowel and bladder function.  She is down 14 pounds on our scale since June.  After that visit, she got a scale at home and has lost 18 pounds from her starting weight at 263.  She was following a keto diet until recently, has come off of it because of the holidays.  She plans to start again in January.  Her son and daughter-in-law are having a baby with a due date in February.  This will be her first  grandchild.  Past Medical/Surgical History: Past Medical History:  Diagnosis Date  . Anemia   . Carpal tunnel syndrome   . Carpal tunnel syndrome   . Complex atypical endometrial hyperplasia   . Depression   . Diverticulosis   . Generalized anxiety disorder   . GERD (gastroesophageal reflux disease)   . History of kidney stones   . Hypertension   . Migraines     Past Surgical History:  Procedure Laterality Date  . CHOLECYSTECTOMY    . ENDOMETRIAL BIOPSY  05/05/2019  . ROBOTIC ASSISTED TOTAL HYSTERECTOMY WITH BILATERAL SALPINGO OOPHERECTOMY N/A 06/17/2019   Procedure: XI ROBOTIC ASSISTED TOTAL HYSTERECTOMY WITH BILATERAL SALPINGECTOMY AND  LYMPH NODE DISSECTION;  Surgeon: Lafonda Mosses, MD;  Location: [facility];  Service: Gynecology;  Laterality: N/A;  . SENTINEL NODE BIOPSY N/A 06/17/2019   Procedure: SENTINEL NODE BIOPSY;  Surgeon: Lafonda Mosses, MD;  Location: [facility];  Service: Gynecology;  Laterality: N/A;    Family History  Problem Relation Age of Onset  . Hypertension Mother   . Diabetes Mother   . COPD Mother   . Thyroid disease Mother   . Thyroid disease Sister   . Thyroid disease Brother   . Throat cancer Maternal Grandfather   . Endometrial cancer Maternal Aunt     Social History   Socioeconomic History  . Marital status: Married    Spouse name: Not on file  . Number of children: Not on file  . Years of education: Not on file  . Highest education level: Not on file  Occupational History  . Not on file  Tobacco Use  . Smoking status: Never Smoker  . Smokeless tobacco: Never Used  Vaping Use  . Vaping Use: Never used  Substance and Sexual Activity  . Alcohol use: Never  . Drug use: Never  . Sexual activity: Yes  Other Topics Concern  . Not on file  Social History Narrative  . Not on file   Social Determinants of Health   Financial Resource Strain: Not on file  Food Insecurity: Not on file   Transportation Needs: Not on file  Physical Activity: Not on file  Stress: Not on file  Social Connections: Not on file    Current Medications:  Current Outpatient Medications:  .  amLODipine (NORVASC) 5 MG tablet, Take 5 mg by mouth daily., Disp: , Rfl:  .  escitalopram (LEXAPRO) 10 MG tablet, Take 10 mg by mouth daily., Disp: , Rfl:  .  loratadine (CLARITIN) 10 MG tablet, Take 10 mg by mouth daily., Disp: , Rfl:  .  losartan (COZAAR) 100 MG tablet, Take 100 mg by mouth daily., Disp: , Rfl:  .  butalbital-acetaminophen-caffeine (FIORICET) 50-325-40 MG tablet, Take 1 tablet by mouth every 6 (six) hours as needed. (Patient not taking: Reported on 04/22/2020), Disp: , Rfl:  .  omeprazole (PRILOSEC) 40 MG capsule, Take 40 mg by mouth daily as needed. (Patient not taking: Reported on 04/22/2020), Disp: , Rfl:   Review of Systems: Pertinent for headache this morning Denies appetite changes, fevers, chills, fatigue, unexplained weight changes. Denies hearing loss, neck lumps or masses, mouth sores, ringing in ears or voice changes. Denies cough or wheezing.  Denies shortness of breath. Denies chest pain or palpitations. Denies leg swelling. Denies abdominal distention, pain, blood in stools, constipation, diarrhea, nausea, vomiting, or early satiety. Denies pain with intercourse, dysuria, frequency, hematuria or incontinence. Denies hot flashes, pelvic pain, vaginal bleeding or vaginal discharge.   Denies joint pain, back pain or muscle pain/cramps. Denies itching, rash, or wounds. Denies dizziness, numbness or seizures. Denies swollen lymph nodes or glands, denies easy bruising or bleeding. Denies anxiety, depression, confusion, or decreased concentration.  Physical Exam: BP 126/70 (BP Location: Left Arm, Patient Position: Sitting)   Pulse 63   Temp (!) 96.8 F (36 C) (Tympanic)   Resp 18   Ht $R'5\' 7"'ed$  (1.702 m)   Wt 243 lb (110.2 kg)   SpO2 99% Comment: RA  BMI 38.06 kg/m   General: Alert, oriented, no acute distress. HEENT: Normocephalic, atraumatic, sclera anicteric. Chest: Unlabored breathing on room air. Abdomen: Obese, soft, nontender.  Normoactive bowel sounds.  No masses or hepatosplenomegaly appreciated.  Well-healed incisions. Extremities: Grossly normal range of motion.  Warm, well perfused.  No edema bilaterally. Skin: No rashes or lesions noted. Lymphatics: No cervical, supraclavicular, or inguinal adenopathy. GU: Normal appearing external genitalia without erythema, excoriation, or lesions.  Speculum exam reveals cuff intact, no lesions or masses.  No bleeding or discharge noted.  Bimanual exam reveals cuff intact, no nodularity.  Rectovaginal exam confirms these findings.  Laboratory & Radiologic Studies: None new  Assessment & Plan: Shamyia Grandpre is a 44 y.o. woman with Stage IA, grade 1 endometrioid endometrial adenocarcinoma,MSI stable, who presents for surveillance visit today.  Heidee is doing well today without evidence of disease on exam.  We reviewed again signs and symptoms that would be concerning for disease recurrence and she knows to call the clinic if she develops any of these before her next scheduled visit.  Per SGO recommendations, we will continue with visits every 6 months for 2 years and then yearly visits.  I congratulated the patient on her weight loss thus far and encouraged her to continue working on further weight loss.  Patient was given a survivorship packet today.  She and I reviewed the details of her treatment as well as recommendations for surveillance and other routine health maintenance and screening.  25 minutes of total time was spent for this patient encounter, including preparation, face-to-face counseling with the patient and coordination of care, and documentation of the encounter.  Jeral Pinch, MD  Division of Gynecologic Oncology  Department of Obstetrics and Gynecology  [facility]

## 2020-04-25 NOTE — Patient Instructions (Signed)
Everything looks great! No evidence of cancer on your exam today. I will see you in 6 months for follow-up. If you develop any new symptoms before your next visit with me, please call the clinic to see me sooner: [phone].

## 2020-11-02 ENCOUNTER — Encounter: Payer: Self-pay | Admitting: Gynecologic Oncology

## 2020-11-03 NOTE — Progress Notes (Unsigned)
Gynecologic Oncology Return Clinic Visit  11/03/20  Reason for Visit: surveillance visit in the setting of early-stage uterine cancer  Treatment History: Oncology History Overview Note  MSI-stable   Endometrial cancer ([place])  05/06/2019 Initial Biopsy   EMB: at least [facility]   05/21/2019 Imaging   CT abdomen/pelvis: Endometrial thickening, compatible with reported history of endometrial cancer.  No evidence of metastatic disease in the abdomen or pelvis.  7 mm hypodensity in the uncinate process, indeterminate and likely benign.  MRI of the abdomen with and without contrast could be used to further evaluate.   06/17/2019 Surgery   TRH/BS, SLN: On EUA, mobile 8-10 cm uterus, no adnexal masses. On intra-abdominal entry, normal appearing liver edge, diaphragm and omentum. Normal small and large bowel, appendix, and colon. Uterus 10cm and bulbous at the fundus. Normal appearing bilateral adnexa. Mapping successful to bilateral SLNs; external iliac SLN somewhat enlarged. No intra-abdominal or pelvic evidence of disease.    06/17/2019 Initial Diagnosis   Endometrial cancer ([place])   06/17/2019 Pathologic Stage   IA, grade 1, no LVSI, negative SLNs. IHC intact.  A. LYMPH NODE, SENTINEL, RIGHT EXTERNAL ILIAC, BIOPSY:  -  Benign adipose tissue  -  No lymphoid tissue identified   B. LYMPH NODE, SENTINEL, RIGHT OBTURATOR, BIOPSY:  -  No carcinoma identified in one lymph node (0/1)  -  See comment   C. LYMPH NODE, RIGHT OBTURATOR, BIOPSY:  -  No carcinoma identified in one lymph node (0/1)  -  See comment   D. LYMPH NODE, SENTINEL, LEFT EXTERNAL ILIAC, BIOPSY:  -  No carcinoma identified in one lymph node (0/1)  -  See comment   E. UTERUS, CERVIX, BILATERAL FALLOPIAN TUBES, HYSTERECTOMY WITH  SALPINGECTOMY:  Uterus:  -  Endometrioid carcinoma, FIGO grade 1  -  Carcinoma confined to the endometrium  -  See oncology table and comment below   Cervix:  -  No carcinoma identified   Bilateral  Fallopian tubes:  -  Benign fallopian tubes with paratubal cysts  -  No carcinoma identified   COMMENT:   A.  Dr. Vic Ripper reviewed the case and agrees with the above diagnosis.   B-D.  Cytokeratin AE1/3 was performed on the sentinel lymph nodes to  exclude micrometastasis.  There is no evidence of metastatic carcinoma  by immunohistochemistry.   ONCOLOGY TABLE:   UTERUS, CARCINOMA OR CARCINOSARCOMA   Procedure: Total hysterectomy and bilateral salpingectomy  Histologic type: Endometrioid carcinoma  Histologic Grade: FIGO grade 1  Myometrial invasion: Carcinoma limited to the endometrium  Uterine Serosa Involvement: Not identified  Cervical stromal involvement: Not identified  Extent of involvement of other organs: Not identified  Lymphovascular invasion: Not identified  Regional Lymph Nodes:       Examined:     3 Sentinel                               0 non-sentinel                               3 total        Lymph nodes with metastasis: 0        Isolated tumor cells (<0.2 mm): 0        Micrometastasis:  (>0.2 mm and < 2.0 mm): 0        Macrometastasis: (>2.0 mm): 0  Extracapsular extension: N/A  Representative Tumor Block: E4  MMR / MSI testing: Will be ordered  Pathologic Stage Classification (pTNM, AJCC 8th edition):  pTIa, pN0  Comments: See above      Interval History:   Since her last visit, reports things are going well.  She denies any vaginal bleeding or discharge.  She endorses a good appetite without nausea or emesis.  She reports normal bowel and bladder function.  She is down 14 pounds on our scale since June.  After that visit, she got a scale at home and has lost 18 pounds from her starting weight at 263.  She was following a keto diet until recently, has come off of it because of the holidays.  She plans to start again in January.  Her son and daughter-in-law are having a baby with a due date in February.  This will be her first grandchild  Past  Medical/Surgical History: Past Medical History:  Diagnosis Date   Anemia    Carpal tunnel syndrome    Carpal tunnel syndrome    Complex atypical endometrial hyperplasia    Depression    Diverticulosis    Generalized anxiety disorder    GERD (gastroesophageal reflux disease)    History of kidney stones    Hypertension    Migraines     Past Surgical History:  Procedure Laterality Date   CHOLECYSTECTOMY     ENDOMETRIAL BIOPSY  05/05/2019   ROBOTIC ASSISTED TOTAL HYSTERECTOMY WITH BILATERAL SALPINGO OOPHERECTOMY N/A 06/17/2019   Procedure: XI ROBOTIC ASSISTED TOTAL HYSTERECTOMY WITH BILATERAL SALPINGECTOMY AND  LYMPH NODE DISSECTION;  Surgeon: Lafonda Mosses, MD;  Location: [place];  Service: Gynecology;  Laterality: N/A;   SENTINEL NODE BIOPSY N/A 06/17/2019   Procedure: SENTINEL NODE BIOPSY;  Surgeon: Lafonda Mosses, MD;  Location: [facility];  Service: Gynecology;  Laterality: N/A;    Family History  Problem Relation Age of Onset   Hypertension Mother    Diabetes Mother    COPD Mother    Thyroid disease Mother    Thyroid disease Sister    Thyroid disease Brother    Throat cancer Maternal Grandfather    Endometrial cancer Maternal Aunt     Social History   Socioeconomic History   Marital status: Married    Spouse name: Not on file   Number of children: Not on file   Years of education: Not on file   Highest education level: Not on file  Occupational History   Not on file  Tobacco Use   Smoking status: Never   Smokeless tobacco: Never  Vaping Use   Vaping Use: Never used  Substance and Sexual Activity   Alcohol use: Never   Drug use: Never   Sexual activity: Yes  Other Topics Concern   Not on file  Social History Narrative   Not on file   Social Determinants of Health   Financial Resource Strain: Not on file  Food Insecurity: Not on file  Transportation Needs: Not on file  Physical Activity: Not on file  Stress:  Not on file  Social Connections: Not on file    Current Medications:  Current Outpatient Medications:    amLODipine (NORVASC) 5 MG tablet, Take 5 mg by mouth daily., Disp: , Rfl:    escitalopram (LEXAPRO) 10 MG tablet, Take 10 mg by mouth daily., Disp: , Rfl:    loratadine (CLARITIN) 10 MG tablet, Take 10 mg by mouth daily., Disp: , Rfl:  losartan (COZAAR) 100 MG tablet, Take 100 mg by mouth daily., Disp: , Rfl:    butalbital-acetaminophen-caffeine (FIORICET) 50-325-40 MG tablet, Take 1 tablet by mouth every 6 (six) hours as needed. (Patient not taking: No sig reported), Disp: , Rfl:    omeprazole (PRILOSEC) 40 MG capsule, Take 40 mg by mouth daily as needed. (Patient not taking: No sig reported), Disp: , Rfl:   Review of Systems: Denies appetite changes, fevers, chills, fatigue, unexplained weight changes. Denies hearing loss, neck lumps or masses, mouth sores, ringing in ears or voice changes. Denies cough or wheezing.  Denies shortness of breath. Denies chest pain or palpitations. Denies leg swelling. Denies abdominal distention, pain, blood in stools, constipation, diarrhea, nausea, vomiting, or early satiety. Denies pain with intercourse, dysuria, frequency, hematuria or incontinence. Denies hot flashes, pelvic pain, vaginal bleeding or vaginal discharge.   Denies joint pain, back pain or muscle pain/cramps. Denies itching, rash, or wounds. Denies dizziness, headaches, numbness or seizures. Denies swollen lymph nodes or glands, denies easy bruising or bleeding. Denies anxiety, depression, confusion, or decreased concentration.  Physical Exam: There were no vitals taken for this visit. General: ***Alert, oriented, no acute distress. HEENT: ***Posterior oropharynx clear, sclera anicteric. Chest: ***Clear to auscultation bilaterally.  ***Port site clean. Cardiovascular: ***Regular rate and rhythm, no murmurs. Abdomen: ***Obese, soft, nontender.  Normoactive bowel sounds.  No  masses or hepatosplenomegaly appreciated.  ***Well-healed scar. Extremities: ***Grossly normal range of motion.  Warm, well perfused.  No edema bilaterally. Skin: ***No rashes or lesions noted. Lymphatics: ***No cervical, supraclavicular, or inguinal adenopathy. GU: Normal appearing external genitalia without erythema, excoriation, or lesions.  Speculum exam reveals ***.  Bimanual exam reveals ***.  ***Rectovaginal exam  confirms ___.  Laboratory & Radiologic Studies:   Assessment & Plan: Amber Cobb is a 45 y.o. woman with Stage IA, grade 1 endometrioid endometrial adenocarcinoma, MSI stable, who presents for surveillance visit today.   Amber Cobb is doing well today without evidence of disease on exam.  We reviewed again signs and symptoms that would be concerning for disease recurrence and she knows to call the clinic if she develops any of these before her next scheduled visit.  Per SGO recommendations, we will continue with visits every 6 months for 2 years and then yearly visits.  I congratulated the patient on her weight loss thus far and encouraged her to continue working on further weight loss.  *** minutes of total time was spent for this patient encounter, including preparation, face-to-face counseling with the patient and coordination of care, and documentation of the encounter.  Jeral Pinch, MD  Division of Gynecologic Oncology  Department of Obstetrics and Gynecology  [facility]

## 2020-11-04 ENCOUNTER — Ambulatory Visit: Payer: 59 | Admitting: Gynecologic Oncology

## 2020-11-04 ENCOUNTER — Telehealth: Payer: Self-pay

## 2020-11-04 NOTE — Telephone Encounter (Signed)
Left message requesting call back to reschedule appointment.

## 2020-12-05 ENCOUNTER — Telehealth: Payer: Self-pay

## 2020-12-05 NOTE — Telephone Encounter (Signed)
Received message from [place] requesting to reschedule her appointment due to testing positive for COVID. She has been rescheduled for 12/23/20. Patient in agreement of date and time.

## 2020-12-09 ENCOUNTER — Ambulatory Visit: Payer: 59 | Admitting: Gynecologic Oncology

## 2020-12-23 ENCOUNTER — Inpatient Hospital Stay: Payer: 59 | Attending: Gynecologic Oncology | Admitting: Gynecologic Oncology

## 2020-12-23 ENCOUNTER — Encounter: Payer: Self-pay | Admitting: Gynecologic Oncology

## 2020-12-23 ENCOUNTER — Inpatient Hospital Stay: Payer: 59

## 2020-12-23 ENCOUNTER — Other Ambulatory Visit: Payer: Self-pay

## 2020-12-23 VITALS — BP 129/67 | HR 59 | Temp 97.0°F | Resp 18 | Wt 253.0 lb

## 2020-12-23 DIAGNOSIS — Z8542 Personal history of malignant neoplasm of other parts of uterus: Secondary | ICD-10-CM | POA: Diagnosis present

## 2020-12-23 DIAGNOSIS — R635 Abnormal weight gain: Secondary | ICD-10-CM | POA: Insufficient documentation

## 2020-12-23 DIAGNOSIS — Z79899 Other long term (current) drug therapy: Secondary | ICD-10-CM | POA: Insufficient documentation

## 2020-12-23 DIAGNOSIS — R6882 Decreased libido: Secondary | ICD-10-CM | POA: Insufficient documentation

## 2020-12-23 DIAGNOSIS — Z90722 Acquired absence of ovaries, bilateral: Secondary | ICD-10-CM | POA: Insufficient documentation

## 2020-12-23 DIAGNOSIS — Z90710 Acquired absence of both cervix and uterus: Secondary | ICD-10-CM | POA: Diagnosis not present

## 2020-12-23 DIAGNOSIS — R5383 Other fatigue: Secondary | ICD-10-CM | POA: Insufficient documentation

## 2020-12-23 DIAGNOSIS — N951 Menopausal and female climacteric states: Secondary | ICD-10-CM

## 2020-12-23 DIAGNOSIS — F32A Depression, unspecified: Secondary | ICD-10-CM | POA: Insufficient documentation

## 2020-12-23 DIAGNOSIS — C541 Malignant neoplasm of endometrium: Secondary | ICD-10-CM

## 2020-12-23 DIAGNOSIS — I10 Essential (primary) hypertension: Secondary | ICD-10-CM | POA: Insufficient documentation

## 2020-12-23 DIAGNOSIS — F411 Generalized anxiety disorder: Secondary | ICD-10-CM | POA: Insufficient documentation

## 2020-12-23 NOTE — Progress Notes (Signed)
Gynecologic Oncology Return Clinic Visit  12/23/20  Reason for Visit: surveillance visit in the setting of early-stage uterine cancer  Treatment History: Oncology History Overview Note  MSI-stable   Endometrial cancer ([place])  05/06/2019 Initial Biopsy   EMB: at least [facility]   05/21/2019 Imaging   CT abdomen/pelvis: Endometrial thickening, compatible with reported history of endometrial cancer.  No evidence of metastatic disease in the abdomen or pelvis.  7 mm hypodensity in the uncinate process, indeterminate and likely benign.  MRI of the abdomen with and without contrast could be used to further evaluate.   06/17/2019 Surgery   TRH/BS, SLN: On EUA, mobile 8-10 cm uterus, no adnexal masses. On intra-abdominal entry, normal appearing liver edge, diaphragm and omentum. Normal small and large bowel, appendix, and colon. Uterus 10cm and bulbous at the fundus. Normal appearing bilateral adnexa. Mapping successful to bilateral SLNs; external iliac SLN somewhat enlarged. No intra-abdominal or pelvic evidence of disease.    06/17/2019 Initial Diagnosis   Endometrial cancer ([place])   06/17/2019 Pathologic Stage   IA, grade 1, no LVSI, negative SLNs. IHC intact.  A. LYMPH NODE, SENTINEL, RIGHT EXTERNAL ILIAC, BIOPSY:  -  Benign adipose tissue  -  No lymphoid tissue identified   B. LYMPH NODE, SENTINEL, RIGHT OBTURATOR, BIOPSY:  -  No carcinoma identified in one lymph node (0/1)  -  See comment   C. LYMPH NODE, RIGHT OBTURATOR, BIOPSY:  -  No carcinoma identified in one lymph node (0/1)  -  See comment   D. LYMPH NODE, SENTINEL, LEFT EXTERNAL ILIAC, BIOPSY:  -  No carcinoma identified in one lymph node (0/1)  -  See comment   E. UTERUS, CERVIX, BILATERAL FALLOPIAN TUBES, HYSTERECTOMY WITH  SALPINGECTOMY:  Uterus:  -  Endometrioid carcinoma, FIGO grade 1  -  Carcinoma confined to the endometrium  -  See oncology table and comment below   Cervix:  -  No carcinoma identified   Bilateral  Fallopian tubes:  -  Benign fallopian tubes with paratubal cysts  -  No carcinoma identified   COMMENT:   A.  Dr. Vic Ripper reviewed the case and agrees with the above diagnosis.   B-D.  Cytokeratin AE1/3 was performed on the sentinel lymph nodes to  exclude micrometastasis.  There is no evidence of metastatic carcinoma  by immunohistochemistry.   ONCOLOGY TABLE:   UTERUS, CARCINOMA OR CARCINOSARCOMA   Procedure: Total hysterectomy and bilateral salpingectomy  Histologic type: Endometrioid carcinoma  Histologic Grade: FIGO grade 1  Myometrial invasion: Carcinoma limited to the endometrium  Uterine Serosa Involvement: Not identified  Cervical stromal involvement: Not identified  Extent of involvement of other organs: Not identified  Lymphovascular invasion: Not identified  Regional Lymph Nodes:       Examined:     3 Sentinel                               0 non-sentinel                               3 total        Lymph nodes with metastasis: 0        Isolated tumor cells (<0.2 mm): 0        Micrometastasis:  (>0.2 mm and < 2.0 mm): 0        Macrometastasis: (>2.0 mm): 0  Extracapsular extension: N/A  Representative Tumor Block: E4  MMR / MSI testing: Will be ordered  Pathologic Stage Classification (pTNM, AJCC 8th edition):  pTIa, pN0  Comments: See above      Interval History: The patient presents today overall doing well since her last visit although struggling with several symptoms including fatigue, lack of energy, lack of sex drive, and weight gain.  She had previously lost almost 20 pounds as of her last visit by following a keto diet.  She has unfortunately gained about 10 pounds since I saw her 6 months ago.  Notes some emotional lability, denies any hot flashes.  She denies any vaginal bleeding or discharge.  She reports regular bowel and bladder function.  She denies any abdominal or pelvic pain.  Past Medical/Surgical History: Past Medical History:   Diagnosis Date   Anemia    Carpal tunnel syndrome    Carpal tunnel syndrome    Complex atypical endometrial hyperplasia    Depression    Diverticulosis    Generalized anxiety disorder    GERD (gastroesophageal reflux disease)    History of kidney stones    Hypertension    Migraines     Past Surgical History:  Procedure Laterality Date   CHOLECYSTECTOMY     ENDOMETRIAL BIOPSY  05/05/2019   ROBOTIC ASSISTED TOTAL HYSTERECTOMY WITH BILATERAL SALPINGO OOPHERECTOMY N/A 06/17/2019   Procedure: XI ROBOTIC ASSISTED TOTAL HYSTERECTOMY WITH BILATERAL SALPINGECTOMY AND  LYMPH NODE DISSECTION;  Surgeon: Lafonda Mosses, MD;  Location: [facility];  Service: Gynecology;  Laterality: N/A;   SENTINEL NODE BIOPSY N/A 06/17/2019   Procedure: SENTINEL NODE BIOPSY;  Surgeon: Lafonda Mosses, MD;  Location: [facility];  Service: Gynecology;  Laterality: N/A;    Family History  Problem Relation Age of Onset   Hypertension Mother    Diabetes Mother    COPD Mother    Thyroid disease Mother    Thyroid disease Sister    Thyroid disease Brother    Throat cancer Maternal Grandfather    Endometrial cancer Maternal Aunt     Social History   Socioeconomic History   Marital status: Married    Spouse name: Not on file   Number of children: Not on file   Years of education: Not on file   Highest education level: Not on file  Occupational History   Not on file  Tobacco Use   Smoking status: Never   Smokeless tobacco: Never  Vaping Use   Vaping Use: Never used  Substance and Sexual Activity   Alcohol use: Never   Drug use: Never   Sexual activity: Yes  Other Topics Concern   Not on file  Social History Narrative   Not on file   Social Determinants of Health   Financial Resource Strain: Not on file  Food Insecurity: Not on file  Transportation Needs: Not on file  Physical Activity: Not on file  Stress: Not on file  Social Connections: Not on file     Current Medications:  Current Outpatient Medications:    amLODipine (NORVASC) 5 MG tablet, Take 5 mg by mouth daily., Disp: , Rfl:    butalbital-acetaminophen-caffeine (FIORICET) 50-325-40 MG tablet, Take 1 tablet by mouth every 6 (six) hours as needed. (Patient not taking: No sig reported), Disp: , Rfl:    escitalopram (LEXAPRO) 10 MG tablet, Take 10 mg by mouth daily., Disp: , Rfl:    loratadine (CLARITIN) 10 MG tablet, Take 10 mg by mouth  daily., Disp: , Rfl:    losartan (COZAAR) 100 MG tablet, Take 100 mg by mouth daily., Disp: , Rfl:    omeprazole (PRILOSEC) 40 MG capsule, Take 40 mg by mouth daily as needed. (Patient not taking: No sig reported), Disp: , Rfl:   Review of Systems: Pertinent positives as per HPI. Denies appetite changes, fevers, chills, unexplained weight changes. Denies hearing loss, neck lumps or masses, mouth sores, ringing in ears or voice changes. Denies cough or wheezing.  Denies shortness of breath. Denies chest pain or palpitations. Denies leg swelling. Denies abdominal distention, pain, blood in stools, constipation, diarrhea, nausea, vomiting, or early satiety. Denies pain with intercourse, dysuria, frequency, hematuria or incontinence. Denies hot flashes, pelvic pain, vaginal bleeding or vaginal discharge.   Denies joint pain, back pain or muscle pain/cramps. Denies itching, rash, or wounds. Denies dizziness, headaches, numbness or seizures. Denies swollen lymph nodes or glands, denies easy bruising or bleeding. Denies anxiety, depression, confusion, or decreased concentration.  Physical Exam: BP 129/67 (BP Location: Right Arm, Patient Position: Sitting)   Pulse (!) 59   Temp (!) 97 F (36.1 C)   Resp 18   Wt 253 lb (114.8 kg)   SpO2 100%   BMI 39.63 kg/m  General: Alert, oriented, no acute distress. HEENT: Atraumatic, normocephalic, sclera anicteric. Chest: Clear to auscultation bilaterally.  No wheezes or rhonchi. Cardiovascular: Regular  rate and rhythm, no murmurs. Abdomen: Obese, soft, nontender.  Normoactive bowel sounds.  No masses or hepatosplenomegaly appreciated.  Well-healed laparoscopic incisions. Extremities: Grossly normal range of motion.  Warm, well perfused.  No edema bilaterally. Skin: No rashes or lesions noted. Lymphatics: No cervical, supraclavicular, or inguinal adenopathy. GU: Normal appearing external genitalia without erythema, excoriation, or lesions.  Speculum exam reveals well rugated vaginal mucosa, no lesions or masses, no bleeding or discharge.  Bimanual exam reveals no masses or nodularity.  Rectovaginal exam confirms these findings.  Laboratory & Radiologic Studies: None new  Assessment & Plan: Amber Cobb is a 44 y.o. woman with Stage IA, grade 1 endometrioid endometrial adenocarcinoma, MSI stable, who presents for surveillance visit today.  Patient is NED on exam today.  She endorses some symptoms that are concerning for hormonal changes, possibly perimenopausal.  She is not having hot flashes, but has decreased libido, fatigue, weight gain, and emotional lability.  I discussed that we could get some testing today to see if she is perimenopausal or menopausal.  If she is, then we discussed the risks and benefits of putting her on some hormone replacement therapy.  If these lab test do not indicate that she is perimenopausal or menopausal, then I suspect that her weight gain is driving her other issues.  She is somewhat tearful today when we discussed her struggles regarding weight gain.  She was successful on a keto diet, but has had difficulty staying on this because her partner has not been wanting to follow the diet with her.  We discussed some strategies about talking with her partner to get his by and hopefully allow her to be more successful sticking to dietary changes that allowed her to lose weight previously.  She notes having decreased libido and body image issues related to her weight gain.    From a cancer surveillance standpoint, we will continue with visits every 6 months per NCCN surveillance recommendations.  At some point, given early stage low risk disease, we could transition to yearly visits per SGO surveillance recommendations.  Patient no signs and symptoms that would  be concerning for disease recurrence that should prompt a phone call before her next scheduled visit.  She has been asked to call towards the end of the year to get a follow-up scheduled with me in February.  38 minutes of total time was spent for this patient encounter, including preparation, face-to-face counseling with the patient and coordination of care, and documentation of the encounter.  Jeral Pinch, MD  Division of Gynecologic Oncology  Department of Obstetrics and Gynecology  [facility]

## 2020-12-23 NOTE — Patient Instructions (Signed)
It was great to see you today.  I will release your lab tests, 2 of which will look at hormone levels and 1 to make sure that your thyroid is functioning normally, in my chart when I get them.  I would encourage you to make some of the dietary changes that we discussed today that allows you to lose weight initially after surgery.  We will see you back in 6 months.  My schedule is not out past December.  Please put on your calendar to call my office in late November or December to get a follow-up scheduled with me in February.  As always, if you develop new symptoms like vaginal bleeding or pelvic pain before your next scheduled visit, please call to be seen sooner.

## 2020-12-24 LAB — FOLLICLE STIMULATING HORMONE: FSH: 9.2 m[IU]/mL

## 2020-12-24 LAB — ESTRADIOL: Estradiol: 36.4 pg/mL

## 2020-12-26 LAB — TSH: TSH: 1.612 u[IU]/mL (ref 0.308–3.960)

## 2021-04-18 ENCOUNTER — Telehealth: Payer: Self-pay | Admitting: *Deleted

## 2021-04-18 NOTE — Telephone Encounter (Signed)
Patient called and scheduled a follow up appt for February with Dr Berline Lopes on 2/13 at 2:15 pm

## 2021-06-26 ENCOUNTER — Inpatient Hospital Stay: Payer: 59 | Attending: Gynecologic Oncology | Admitting: Gynecologic Oncology

## 2021-06-26 ENCOUNTER — Other Ambulatory Visit: Payer: Self-pay

## 2021-06-26 ENCOUNTER — Encounter: Payer: Self-pay | Admitting: Gynecologic Oncology

## 2021-06-26 VITALS — BP 136/83 | HR 77 | Temp 98.1°F | Resp 18 | Wt 259.4 lb

## 2021-06-26 DIAGNOSIS — K921 Melena: Secondary | ICD-10-CM | POA: Diagnosis not present

## 2021-06-26 DIAGNOSIS — K625 Hemorrhage of anus and rectum: Secondary | ICD-10-CM

## 2021-06-26 DIAGNOSIS — K5900 Constipation, unspecified: Secondary | ICD-10-CM | POA: Diagnosis not present

## 2021-06-26 DIAGNOSIS — R519 Headache, unspecified: Secondary | ICD-10-CM | POA: Diagnosis not present

## 2021-06-26 DIAGNOSIS — Z90710 Acquired absence of both cervix and uterus: Secondary | ICD-10-CM | POA: Insufficient documentation

## 2021-06-26 DIAGNOSIS — Z90722 Acquired absence of ovaries, bilateral: Secondary | ICD-10-CM | POA: Insufficient documentation

## 2021-06-26 DIAGNOSIS — Z8542 Personal history of malignant neoplasm of other parts of uterus: Secondary | ICD-10-CM | POA: Insufficient documentation

## 2021-06-26 DIAGNOSIS — C541 Malignant neoplasm of endometrium: Secondary | ICD-10-CM

## 2021-06-26 NOTE — Patient Instructions (Signed)
It was good to see you today.  I do not see or feel any evidence of cancer recurrence on your exam.  I have placed a referral for you to meet with a gastroenterology about your rectal bleeding.  I encourage you to talk to your primary care provider about the headaches associated with having bowel movements.  Please let me know what if any testing you have in the results.  I will see for follow-up in 6 months.  My schedule is not out past July.  Please call back sometime in June or July to get a visit scheduled with me in August.

## 2021-06-26 NOTE — Progress Notes (Signed)
Gynecologic Oncology Return Clinic Visit  06/26/21  Reason for Visit: surveillance visit in the setting of early-stage uterine cancer  Treatment History: Oncology History Overview Note  MSI-stable   Endometrial cancer ([place])  05/06/2019 Initial Biopsy   EMB: at least [facility]   05/21/2019 Imaging   CT abdomen/pelvis: Endometrial thickening, compatible with reported history of endometrial cancer.  No evidence of metastatic disease in the abdomen or pelvis.  7 mm hypodensity in the uncinate process, indeterminate and likely benign.  MRI of the abdomen with and without contrast could be used to further evaluate.   06/17/2019 Surgery   TRH/BS, SLN: On EUA, mobile 8-10 cm uterus, no adnexal masses. On intra-abdominal entry, normal appearing liver edge, diaphragm and omentum. Normal small and large bowel, appendix, and colon. Uterus 10cm and bulbous at the fundus. Normal appearing bilateral adnexa. Mapping successful to bilateral SLNs; external iliac SLN somewhat enlarged. No intra-abdominal or pelvic evidence of disease.    06/17/2019 Initial Diagnosis   Endometrial cancer ([place])   06/17/2019 Pathologic Stage   IA, grade 1, no LVSI, negative SLNs. IHC intact.  A. LYMPH NODE, SENTINEL, RIGHT EXTERNAL ILIAC, BIOPSY:  -  Benign adipose tissue  -  No lymphoid tissue identified   B. LYMPH NODE, SENTINEL, RIGHT OBTURATOR, BIOPSY:  -  No carcinoma identified in one lymph node (0/1)  -  See comment   C. LYMPH NODE, RIGHT OBTURATOR, BIOPSY:  -  No carcinoma identified in one lymph node (0/1)  -  See comment   D. LYMPH NODE, SENTINEL, LEFT EXTERNAL ILIAC, BIOPSY:  -  No carcinoma identified in one lymph node (0/1)  -  See comment   E. UTERUS, CERVIX, BILATERAL FALLOPIAN TUBES, HYSTERECTOMY WITH  SALPINGECTOMY:  Uterus:  -  Endometrioid carcinoma, FIGO grade 1  -  Carcinoma confined to the endometrium  -  See oncology table and comment below   Cervix:  -  No carcinoma identified   Bilateral  Fallopian tubes:  -  Benign fallopian tubes with paratubal cysts  -  No carcinoma identified   COMMENT:   A.  Dr. Vic Ripper reviewed the case and agrees with the above diagnosis.   B-D.  Cytokeratin AE1/3 was performed on the sentinel lymph nodes to  exclude micrometastasis.  There is no evidence of metastatic carcinoma  by immunohistochemistry.   ONCOLOGY TABLE:   UTERUS, CARCINOMA OR CARCINOSARCOMA   Procedure: Total hysterectomy and bilateral salpingectomy  Histologic type: Endometrioid carcinoma  Histologic Grade: FIGO grade 1  Myometrial invasion: Carcinoma limited to the endometrium  Uterine Serosa Involvement: Not identified  Cervical stromal involvement: Not identified  Extent of involvement of other organs: Not identified  Lymphovascular invasion: Not identified  Regional Lymph Nodes:       Examined:     3 Sentinel                               0 non-sentinel                               3 total        Lymph nodes with metastasis: 0        Isolated tumor cells (<0.2 mm): 0        Micrometastasis:  (>0.2 mm and < 2.0 mm): 0        Macrometastasis: (>2.0 mm): 0  Extracapsular extension: N/A  Representative Tumor Block: E4  MMR / MSI testing: Will be ordered  Pathologic Stage Classification (pTNM, AJCC 8th edition):  pTIa, pN0  Comments: See above      Interval History: Patient presents today for follow-up.  She notes overall doing well.  She denies any vaginal bleeding or discharge.  She continues to have constipation intermittently.  Denies any urinary symptoms.  Endorses about a year of intermittent symptoms that include a sharp pain over her eyes when she needs to have a bowel movement.  This can happen either when she is constipated or not.  She will sometimes have really sharp pain that goes away very quickly, and other times that she will have a bad headache for several hours subsequently.  She has a history of headaches and uses Fioricet for these.  She  recently got new glasses and at that visit it was recommended that she see an ophthalmologist.  She is scheduled to see her primary care provider in March.  She notes having bleeding with bowel movements about every 2 to 3 months.  This does not seem to be related to whether she is constipated or not.  Past Medical/Surgical History: Past Medical History:  Diagnosis Date   Anemia    Carpal tunnel syndrome    Carpal tunnel syndrome    Complex atypical endometrial hyperplasia    Depression    Diverticulosis    Generalized anxiety disorder    GERD (gastroesophageal reflux disease)    History of kidney stones    Hypertension    Migraines     Past Surgical History:  Procedure Laterality Date   CHOLECYSTECTOMY     ENDOMETRIAL BIOPSY  05/05/2019   ROBOTIC ASSISTED TOTAL HYSTERECTOMY WITH BILATERAL SALPINGO OOPHERECTOMY N/A 06/17/2019   Procedure: XI ROBOTIC ASSISTED TOTAL HYSTERECTOMY WITH BILATERAL SALPINGECTOMY AND  LYMPH NODE DISSECTION;  Surgeon: Lafonda Mosses, MD;  Location: [place];  Service: Gynecology;  Laterality: N/A;   SENTINEL NODE BIOPSY N/A 06/17/2019   Procedure: SENTINEL NODE BIOPSY;  Surgeon: Lafonda Mosses, MD;  Location: [facility];  Service: Gynecology;  Laterality: N/A;    Family History  Problem Relation Age of Onset   Hypertension Mother    Diabetes Mother    COPD Mother    Thyroid disease Mother    Thyroid disease Sister    Thyroid disease Brother    Throat cancer Maternal Grandfather    Endometrial cancer Maternal Aunt     Social History   Socioeconomic History   Marital status: Married    Spouse name: Not on file   Number of children: Not on file   Years of education: Not on file   Highest education level: Not on file  Occupational History   Not on file  Tobacco Use   Smoking status: Never   Smokeless tobacco: Never  Vaping Use   Vaping Use: Never used  Substance and Sexual Activity   Alcohol use:  Never   Drug use: Never   Sexual activity: Yes  Other Topics Concern   Not on file  Social History Narrative   Not on file   Social Determinants of Health   Financial Resource Strain: Not on file  Food Insecurity: Not on file  Transportation Needs: Not on file  Physical Activity: Not on file  Stress: Not on file  Social Connections: Not on file    Current Medications:  Current Outpatient Medications:    amLODipine ([place])  5 MG tablet, Take 5 mg by mouth daily., Disp: , Rfl:    butalbital-acetaminophen-caffeine (FIORICET) 50-325-40 MG tablet, Take 1 tablet by mouth every 6 (six) hours as needed., Disp: , Rfl:    escitalopram (LEXAPRO) 10 MG tablet, Take 10 mg by mouth daily., Disp: , Rfl:    loratadine (CLARITIN) 10 MG tablet, Take 10 mg by mouth daily., Disp: , Rfl:    losartan (COZAAR) 100 MG tablet, Take 100 mg by mouth daily., Disp: , Rfl:    Melatonin 5 MG CAPS, Take 1 capsule by mouth at bedtime., Disp: , Rfl:    Multiple Vitamin (MULTIVITAMIN) capsule, Take 1 capsule by mouth daily., Disp: , Rfl:    omeprazole (PRILOSEC) 40 MG capsule, Take 40 mg by mouth daily as needed. (Patient not taking: Reported on 04/22/2020), Disp: , Rfl:   Review of Systems: + Joint pain, headaches. Denies appetite changes, fevers, chills, fatigue, unexplained weight changes. Denies hearing loss, neck lumps or masses, mouth sores, ringing in ears or voice changes. Denies cough or wheezing.  Denies shortness of breath. Denies chest pain or palpitations. Denies leg swelling. Denies abdominal distention, pain, blood in stools, constipation, diarrhea, nausea, vomiting, or early satiety. Denies pain with intercourse, dysuria, frequency, hematuria or incontinence. Denies hot flashes, pelvic pain, vaginal bleeding or vaginal discharge.   Denies back pain or muscle pain/cramps. Denies itching, rash, or wounds. Denies dizziness, numbness or seizures. Denies swollen lymph nodes or glands, denies easy  bruising or bleeding. Denies anxiety, depression, confusion, or decreased concentration.  Physical Exam: BP 136/83 (BP Location: Left Arm, Patient Position: Sitting)    Pulse 77    Temp 98.1 F (36.7 C) (Oral)    Resp 18    Wt 259 lb 6.4 oz (117.7 kg)    SpO2 99%    BMI 40.63 kg/m  General: Alert, oriented, no acute distress. HEENT: Normocephalic, atraumatic, sclera anicteric. Chest: Clear to auscultation bilaterally.  No wheezes or rhonchi. Cardiovascular: Regular rate and rhythm, no murmurs. Abdomen: Obese, soft, nontender.  Normoactive bowel sounds.  No masses or hepatosplenomegaly appreciated.  Well-healed incisions. Extremities: Grossly normal range of motion.  Warm, well perfused.  No edema bilaterally. Skin: No rashes or lesions noted. Lymphatics: No cervical, supraclavicular, or inguinal adenopathy. GU:  Normal appearing external genitalia without erythema, excoriation, or lesions.  Speculum exam reveals well rugated vaginal mucosa, no lesions or masses, no bleeding or discharge.  Bimanual exam reveals no masses or nodularity.  Rectovaginal exam confirms these findings.  No external hemorrhoids noted.  Laboratory & Radiologic Studies: No new  Assessment & Plan: Amber Cobb is a 46 y.o. woman with Stage IA, grade 1 endometrioid endometrial adenocarcinoma, MSI stable, who presents for surveillance visit today. Surgery 06/2019.  Patient is overall doing well and is NED on exam today.  With regard to her headache, this seems related to symptoms that develop when she Valsalvas.  It is interesting that it does not happen all the time and has only developed more recently.  I have recommended that she follow-up with her PCP for further work-up related to her headaches.  With regard to her rectal bleeding, she does not have external hemorrhoids.  She may have internal hemorrhoids.  We discussed the possibility of GI referral, and she was interested.  GI referral placed today.  From a  cancer surveillance standpoint, we will continue with visits every 6 months per NCCN surveillance recommendations.  At some point, given early stage low risk disease, we could  transition to yearly visits per SGO surveillance recommendations.  Patient no signs and symptoms that would be concerning for disease recurrence that should prompt a phone call before her next scheduled visit.  She has been asked to call over the summer to get a visit scheduled with me in August.  32 minutes of total time was spent for this patient encounter, including preparation, face-to-face counseling with the patient and coordination of care, and documentation of the encounter.  Jeral Pinch, MD  Division of Gynecologic Oncology  Department of Obstetrics and Gynecology  [facility]

## 2021-07-03 ENCOUNTER — Encounter: Payer: Self-pay | Admitting: Gastroenterology

## 2021-07-31 ENCOUNTER — Ambulatory Visit (INDEPENDENT_AMBULATORY_CARE_PROVIDER_SITE_OTHER): Payer: 59 | Admitting: Gastroenterology

## 2021-07-31 ENCOUNTER — Encounter: Payer: Self-pay | Admitting: Gastroenterology

## 2021-07-31 VITALS — BP 134/88 | HR 60 | Ht 66.0 in | Wt 256.8 lb

## 2021-07-31 DIAGNOSIS — K625 Hemorrhage of anus and rectum: Secondary | ICD-10-CM | POA: Diagnosis not present

## 2021-07-31 MED ORDER — NA SULFATE-K SULFATE-MG SULF 17.5-3.13-1.6 GM/177ML PO SOLN: 1.0000 | Freq: Once | ORAL | 0 refills | Status: AC

## 2021-07-31 NOTE — Progress Notes (Signed)
? ?Referring Provider: Lafonda Mosses, MD ?Primary Care Physician:  Ollen Bowl, MD ? ? ?Reason for Consultation:  Rectal bleeding ? ? ?IMPRESSION:  ?Intermittent, painless rectal bleeding over the last year ? ?The differential for rectal bleeding is broad.  It includes outlet sources such as fissure or hemorrhoids, as well as polyps, mass, ulcers, and colitis.  Given this differential I am recommending a colonoscopy. ? ?PLAN: ?High fiber diet recommended, drink at least 1.5-2 liters of water ?Daily stool bulking agent with psyllium or methylcellulose recommended ?Colonoscopy ? ? ? ?HPI: Amber Cobb is a 46 y.o. female referred by Dr. Berline Lopes for further evaluation of rectal bleeding.  The history is obtained through the patient, records provided by Dr. Berline Lopes, and review of her electronic health record.  She has a history of anemia that resolved after hysterectomy, anxiety, chronic headaches, diverticulosis with possible diverticulitis in 2012, depression, hypertension, nephrolithiasis, obesity, urinary tract infections, and early 1A, grade 1 stage endometrial cancer diagnosed in 2020.  She had a cholecystectomy for gallstones in 2008. ? ?Intermittent rectal bleeding over the year, last 07/04/21. Can be bright red blood on the toilet paper and in the bowel. No rectal pain on defecation. No history of rectal foreign body or trauma. No prior history of bleeding or similar rectal pain.  No abdominal pain. No constipation, diarrhea, or changes in bowel habits.  No history of rectal tearing or straining with defecation. ? ?Symptoms stop before she needs to take something.  ? ?She was her OB/GYN who suggested the possibility of internal hemorrhoids.  ? ?There is no known family history of colon cancer or polyps. No family history of stomach cancer or other GI malignancy. No family history of inflammatory bowel disease or celiac.  ? ? ?Past Medical History:  ?Diagnosis Date  ? Anemia   ? Anxiety   ? Carpal  tunnel syndrome   ? Complex atypical endometrial hyperplasia   ? Depression   ? Diverticulosis   ? Endometrial cancer ([place])   ? Gallstones   ? Generalized anxiety disorder   ? GERD (gastroesophageal reflux disease)   ? History of kidney stones   ? Hypertension   ? Migraines   ? Obesity   ? UTI (urinary tract infection)   ? ? ?Past Surgical History:  ?Procedure Laterality Date  ? CARPAL TUNNEL RELEASE Bilateral   ? CHOLECYSTECTOMY    ? ENDOMETRIAL BIOPSY  05/05/2019  ? ROBOTIC ASSISTED TOTAL HYSTERECTOMY WITH BILATERAL SALPINGO OOPHERECTOMY N/A 06/17/2019  ? Procedure: XI ROBOTIC ASSISTED TOTAL HYSTERECTOMY WITH BILATERAL SALPINGECTOMY AND  LYMPH NODE DISSECTION;  Surgeon: Lafonda Mosses, MD;  Location: [facility];  Service: Gynecology;  Laterality: N/A;  ? SENTINEL NODE BIOPSY N/A 06/17/2019  ? Procedure: SENTINEL NODE BIOPSY;  Surgeon: Lafonda Mosses, MD;  [facility];  Service: Gynecology;  Laterality: N/A;  ? ? ?Current Outpatient Medications  ?Medication Sig Dispense Refill  ? amLODipine (NORVASC) 5 MG tablet Take 5 mg by mouth daily.    ? butalbital-acetaminophen-caffeine (FIORICET) 50-325-40 MG tablet Take 1 tablet by mouth every 6 (six) hours as needed.    ? escitalopram (LEXAPRO) 10 MG tablet Take 10 mg by mouth daily.    ? loratadine (CLARITIN) 10 MG tablet Take 10 mg by mouth daily.    ? losartan (COZAAR) 100 MG tablet Take 100 mg by mouth daily.    ? Melatonin 5 MG CAPS Take 1 capsule by mouth at bedtime.    ?  Multiple Vitamin (MULTIVITAMIN) capsule Take 1 capsule by mouth daily.    ? Na Sulfate-K Sulfate-Mg Sulf 17.5-3.13-1.6 GM/177ML SOLN Take 1 kit by mouth once for 1 dose. 354 mL 0  ? omeprazole (PRILOSEC) 40 MG capsule Take 40 mg by mouth daily as needed.    ? ?No current facility-administered medications for this visit.  ? ? ?Allergies as of 07/31/2021 - Review Complete 07/31/2021  ?Allergen Reaction Noted  ? Chloraprep one step [chlorhexidine  gluconate] Hives 07/08/2019  ? ? ?Family History  ?Problem Relation Age of Onset  ? Hypertension Mother   ? Diabetes Mother   ? COPD Mother   ? Thyroid disease Mother   ? Thyroid disease Sister   ? Thyroid disease Brother   ? Throat cancer Maternal Grandfather   ? Endometrial cancer Maternal Aunt   ? Lung cancer Maternal Uncle   ? Colon cancer Neg Hx   ? Esophageal cancer Neg Hx   ? Pancreatic cancer Neg Hx   ? Colon polyps Neg Hx   ? ? ?Social History  ? ?Socioeconomic History  ? Marital status: Married  ?  Spouse name: Not on file  ? Number of children: 2  ? Years of education: Not on file  ? Highest education level: Not on file  ?Occupational History  ? Not on file  ?Tobacco Use  ? Smoking status: Never  ? Smokeless tobacco: Never  ?Vaping Use  ? Vaping Use: Never used  ?Substance and Sexual Activity  ? Alcohol use: Not Currently  ?  Comment: occasionally  ? Drug use: Never  ? Sexual activity: Yes  ?Other Topics Concern  ? Not on file  ?Social History Narrative  ? Not on file  ? ?Social Determinants of Health  ? ?Financial Resource Strain: Not on file  ?Food Insecurity: Not on file  ?Transportation Needs: Not on file  ?Physical Activity: Not on file  ?Stress: Not on file  ?Social Connections: Not on file  ?Intimate Partner Violence: Not on file  ? ? ?Review of Systems: ?12 system ROS is negative except as noted above with the addition of anxiety, depression, fatigue, headaches, itching, lower extremity edema.  ? ?Physical Exam: ?General:   Alert,  well-nourished, pleasant and cooperative in NAD ?Head:  Normocephalic and atraumatic. ?Eyes:  Sclera clear, no icterus.   Conjunctiva pink. ?Ears:  Normal auditory acuity. ?Nose:  No deformity, discharge,  or lesions. ?Mouth:  No deformity or lesions.   ?Neck:  Supple; no masses or thyromegaly. ?Lungs:  Clear throughout to auscultation.   No wheezes. ?Heart:  Regular rate and rhythm; no murmurs. ?Abdomen:  Soft, nontender, nondistended, normal bowel sounds, no rebound  or guarding. No hepatosplenomegaly.   ?Rectal:  Deferred to colonoscopy ?Msk:  Symmetrical. No boney deformities ?LAD: No inguinal or umbilical LAD ?Extremities:  No clubbing or edema. ?Neurologic:  Alert and  oriented x4;  grossly nonfocal ?Skin:  Intact without significant lesions or rashes. ?Psych:  Alert and cooperative. Normal mood and affect. ? ? ? ?Bowdy Bair L. Tarri Glenn, MD, MPH ?07/31/2021, 1:22 PM ? ? ? ?  ?

## 2021-07-31 NOTE — Patient Instructions (Addendum)
It was my pleasure to provide care to you today. Based on our discussion, I am providing you with my recommendations below: ? ?RECOMMENDATION(S):  ? ?High fiber diet recommended, drink at least 1.5-2 liters of water. ? ?Daily stool bulking agent with psyllium or methylcellulose recommended. ? ?COLONOSCOPY:  ? ?You have been scheduled for a colonoscopy. Please follow written instructions given to you at your visit today.  ? ?PREP:  ? ?Please pick up your prep supplies at the pharmacy within the next 1-3 days. ? ?INHALERS:  ? ?If you use inhalers (even only as needed), please bring them with you on the day of your procedure. ? ?COLONOSCOPY TIPS: ? ?To reduce nausea and dehydration, stay well hydrated for 3-4 days prior to the exam.  ?To prevent skin/hemorrhoid irritation - prior to wiping, put A&Dointment or vaseline on the toilet paper. ?Keep a towel or pad on the bed.  ?BEFORE STARTING YOUR PREP, drink  64oz of clear liquids in the morning. This will help to flush the colon and will ensure you are well hydrated!!!!  ?NOTE - This is in addition to the fluids required for to complete your prep. ?Use of a flavored hard candy, such as grape Anise Salvo, can counteract some of the flavor of the prep and may prevent some nausea.  ? ? ?FOLLOW UP: ? ?After your procedure, you will receive a call from my office staff regarding my recommendation for follow up. ? ?BMI: ? ?If you are age 39 or younger, your body mass index should be between 19-25. Your Body mass index is 41.45 kg/m?Marland Kitchen If this is out of the aformentioned range listed, please consider follow up with your Primary Care Provider.  ? ?MY CHART: ? ?The [facility] providers would like to encourage you to use [facility] to communicate with providers for non-urgent requests or questions.  Due to long hold times on the telephone, sending your provider a message by [facility] may be a faster and more efficient way to get a response.  Please allow 48 business hours for a  response.  Please remember that this is for non-urgent requests.  ? ?Thank you for trusting me with your gastrointestinal care!   ? ?Thornton Park, MD, MPH ? ?

## 2021-08-03 ENCOUNTER — Other Ambulatory Visit: Payer: Self-pay | Admitting: Ophthalmology

## 2021-08-03 DIAGNOSIS — G902 Horner's syndrome: Secondary | ICD-10-CM

## 2021-08-07 ENCOUNTER — Encounter: Payer: Self-pay | Admitting: Gastroenterology

## 2021-08-11 ENCOUNTER — Ambulatory Visit (AMBULATORY_SURGERY_CENTER): Payer: 59 | Admitting: Gastroenterology

## 2021-08-11 ENCOUNTER — Encounter: Payer: Self-pay | Admitting: Gastroenterology

## 2021-08-11 VITALS — BP 99/68 | HR 57 | Temp 98.0°F | Resp 15 | Ht 66.0 in | Wt 256.0 lb

## 2021-08-11 DIAGNOSIS — K625 Hemorrhage of anus and rectum: Secondary | ICD-10-CM | POA: Diagnosis present

## 2021-08-11 DIAGNOSIS — K5730 Diverticulosis of large intestine without perforation or abscess without bleeding: Secondary | ICD-10-CM

## 2021-08-11 DIAGNOSIS — K648 Other hemorrhoids: Secondary | ICD-10-CM | POA: Diagnosis not present

## 2021-08-11 MED ORDER — HYDROCORTISONE ACETATE 25 MG RE SUPP: 25.0000 mg | Freq: Two times a day (BID) | RECTAL | 1 refills | Status: DC

## 2021-08-11 MED ORDER — SODIUM CHLORIDE 0.9 % IV SOLN: 500.0000 mL | Freq: Once | INTRAVENOUS | Status: DC

## 2021-08-11 NOTE — Progress Notes (Signed)
Pt's states no medical or surgical changes since previsit or office visit. 

## 2021-08-11 NOTE — Progress Notes (Signed)
Indication for colonoscopy: Rectal bleeding ? ?Please see the office visit from 07/31/2021 for full details.  There is been no change in history or physical exam.  The patient remains an appropriate candidate for monitored anesthesia care in the endoscopy center. ?

## 2021-08-11 NOTE — Patient Instructions (Signed)
Handouts given on hemorrhoids and diverticulosis.  Resume regular diet and continue present medications.  Use a daily dose of Metamucil or benefiber.  Start Anusol HC suppositories BID PRN.   ? ? ?YOU HAD AN ENDOSCOPIC PROCEDURE TODAY AT [facility]:   Refer to the procedure report that was given to you for any specific questions about what was found during the examination.  If the procedure report does not answer your questions, please call your gastroenterologist to clarify.  If you requested that your care partner not be given the details of your procedure findings, then the procedure report has been included in a sealed envelope for you to review at your convenience later. ? ?YOU SHOULD EXPECT: Some feelings of bloating in the abdomen. Passage of more gas than usual.  Walking can help get rid of the air that was put into your GI tract during the procedure and reduce the bloating. If you had a lower endoscopy (such as a colonoscopy or flexible sigmoidoscopy) you may notice spotting of blood in your stool or on the toilet paper. If you underwent a bowel prep for your procedure, you may not have a normal bowel movement for a few days. ? ?Please Note:  You might notice some irritation and congestion in your nose or some drainage.  This is from the oxygen used during your procedure.  There is no need for concern and it should clear up in a day or so. ? ?SYMPTOMS TO REPORT IMMEDIATELY: ? ?Following lower endoscopy (colonoscopy or flexible sigmoidoscopy): ? Excessive amounts of blood in the stool ? Significant tenderness or worsening of abdominal pains ? Swelling of the abdomen that is new, acute ? Fever of 100?F or higher ? ?For urgent or emergent issues, a gastroenterologist can be reached at any hour by calling [phone]. ?Do not use MyChart messaging for urgent concerns.  ? ? ?DIET:  We do recommend a small meal at first, but then you may proceed to your regular diet.  Drink plenty of fluids  but you should avoid alcoholic beverages for 24 hours. ? ?ACTIVITY:  You should plan to take it easy for the rest of today and you should NOT DRIVE or use heavy machinery until tomorrow (because of the sedation medicines used during the test).   ? ?FOLLOW UP: ?Our staff will call the number listed on your records 48-72 hours following your procedure to check on you and address any questions or concerns that you may have regarding the information given to you following your procedure. If we do not reach you, we will leave a message.  We will attempt to reach you two times.  During this call, we will ask if you have developed any symptoms of COVID 19. If you develop any symptoms (ie: fever, flu-like symptoms, shortness of breath, cough etc.) before then, please call [phone].  If you test positive for Covid 19 in the 2 weeks post procedure, please call and report this information to [place].   ? ?If any biopsies were taken you will be contacted by phone or by letter within the next 1-3 weeks.  Please call us at [phone] if you have not heard about the biopsies in 3 weeks.  ? ? ?SIGNATURES/CONFIDENTIALITY: ?You and/or your care partner have signed paperwork which will be entered into your electronic medical record.  These signatures attest to the fact that that the information above on your After Visit Summary has been reviewed and is understood.  Full  responsibility of the confidentiality of this discharge information lies with you and/or your care-partner.  ?

## 2021-08-11 NOTE — Progress Notes (Signed)
Sedate, gd SR, tolerated procedure well, VSS, report to RN 

## 2021-08-11 NOTE — Op Note (Signed)
[place] ?Patient Name: Amber Cobb ?Procedure Date: 08/11/2021 10:31 AM ?MRN: [id number] ?Endoscopist: Thornton Park MD, MD ?Age: 46 ?Referring MD:  ?Date of Birth: 25-Jan-1976 ?Gender: Female ?Account #: [id number] ?Procedure:                Colonoscopy ?Indications:              Rectal bleeding ?Medicines:                Monitored Anesthesia Care ?Procedure:                Pre-Anesthesia Assessment: ?                          - Prior to the procedure, a History and Physical  ?                          was performed, and patient medications and  ?                          allergies were reviewed. The patient's tolerance of  ?                          previous anesthesia was also reviewed. The risks  ?                          and benefits of the procedure and the sedation  ?                          options and risks were discussed with the patient.  ?                          All questions were answered, and informed consent  ?                          was obtained. Prior Anticoagulants: The patient has  ?                          taken no previous anticoagulant or antiplatelet  ?                          agents. ASA Grade Assessment: II - A patient with  ?                          mild systemic disease. After reviewing the risks  ?                          and benefits, the patient was deemed in  ?                          satisfactory condition to undergo the procedure. ?                          After obtaining informed consent, the colonoscope  ?  was passed under direct vision. Throughout the  ?                          procedure, the patient's blood pressure, pulse, and  ?                          oxygen saturations were monitored continuously. The  ?                          CF [id number] #[id number] was introduced through the anus  ?                          and advanced to the 3 cm into the ileum. A second  ?                          forward view of the right colon was  performed. The  ?                          colonoscopy was performed without difficulty. The  ?                          patient tolerated the procedure well. The quality  ?                          of the bowel preparation was good. The terminal  ?                          ileum, ileocecal valve, appendiceal orifice, and  ?                          rectum were photographed. ?Scope In: 10:38:11 AM ?Scope Out: 10:50:41 AM ?Scope Withdrawal Time: 0 hours 8 minutes 9 seconds  ?Total Procedure Duration: 0 hours 12 minutes 30 seconds  ?Findings:                 Hemorrhoids were found on perianal exam. ?                          Non-bleeding internal hemorrhoids were found. ?                          Multiple small and large-mouthed diverticula were  ?                          found in the sigmoid colon, descending colon,  ?                          transverse colon and ascending colon. ?                          The exam was otherwise without abnormality on  ?                          direct and retroflexion views. ?Complications:  No immediate complications. ?Estimated Blood Loss:     Estimated blood loss: none. ?Impression:               - Non-bleeding internal and external hemorrhoids. ?                          - The entire examined colon is normal. ?                          - The examined portion of the ileum was normal. ?                          - No specimens collected. ?Recommendation:           - Patient has a contact number available for  ?                          emergencies. The signs and symptoms of potential  ?                          delayed complications were discussed with the  ?                          patient. Return to normal activities tomorrow.  ?                          Written discharge instructions were provided to the  ?                          patient. ?                          - Resume previous diet. High fiber diet recommended. ?                          - Continue present  medications. ?                          - Use a daily dose of Metamucil or Benefiber. ?                          - Start Anusol HC suppositories BID PRN. ?                          - Repeat colonoscopy in 10 years for surveillance,  ?                          earlier if needed. ?                          - Emerging evidence supports eating a diet of  ?                          fruits, vegetables, grains, calcium, and yogurt  ?  while reducing red meat and alcohol may reduce the  ?                          risk of colon cancer. ?                          - Thank you for allowing me to be involved in your  ?                          colon cancer prevention. ?Thornton Park MD, MD ?08/11/2021 11:01:02 AM ?This report has been signed electronically. ?

## 2021-08-15 ENCOUNTER — Telehealth: Payer: Self-pay | Admitting: *Deleted

## 2021-08-15 NOTE — Telephone Encounter (Signed)
?  Follow up Call- ? ? ?  08/11/2021  ?  9:38 AM  ?Call back number  ?Post procedure Call Back phone  # [phone]  ?Permission to leave phone message Yes  ?  ? ?No answer at 2nd attempt follow up phone call.  Left message on voicemail.   ? ? ?

## 2021-08-15 NOTE — Telephone Encounter (Signed)
No answer for post procedure call back. Left VM. 

## 2021-08-24 ENCOUNTER — Ambulatory Visit
Admission: RE | Admit: 2021-08-24 | Discharge: 2021-08-24 | Disposition: A | Discharge status: Home / Self Care | Payer: 59 | Source: Ambulatory Visit | Attending: Ophthalmology | Admitting: Ophthalmology

## 2021-08-24 DIAGNOSIS — G902 Horner's syndrome: Secondary | ICD-10-CM

## 2021-08-24 IMAGING — MR MR HEAD WO/W CM
14 series · 48 of 48 positions shown · IV contrast (20ML MULTIHANCE)
Comparison: None.

CLINICAL DATA: Horner syndrome. Right-sided ptosis for 1 year.
Headaches.

EXAM:
MRI HEAD WITHOUT AND WITH CONTRAST
TECHNIQUE: Multiplanar, multiecho pulse sequences of the brain and surrounding
structures were obtained without and with intravenous contrast.
CONTRAST:  20mL MULTIHANCE GADOBENATE DIMEGLUMINE 529 MG/ML IV SOLN

[Series 5: T1 · sagittal · 4.0mm · 0.94mm/px · 1 of 31 slices shown (1 of 3)]
[im 1/31]
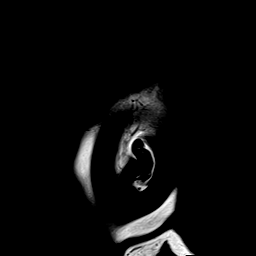

[Series 6: DWI · axial · 3.0mm · 0.94mm/px · z∈[-84,+63]mm · 8 of 168 slices shown (1 of 3)]
[im 1/168]
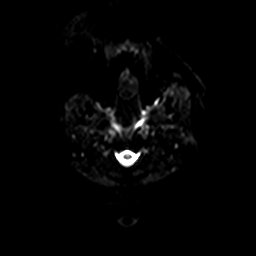
[im 24/168]
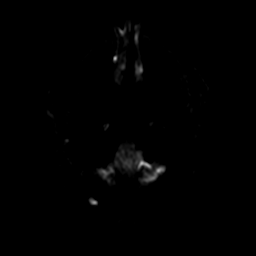
[im 48/168]
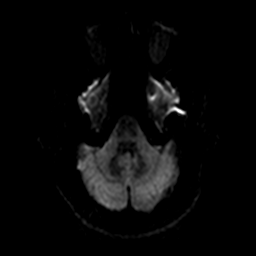
[im 72/168]
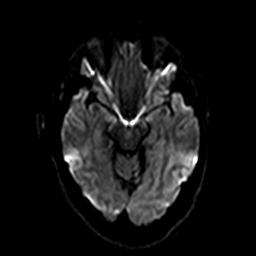
[im 96/168]
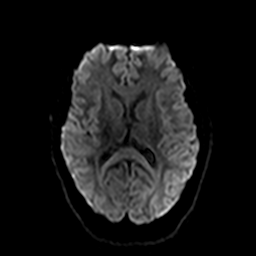
[im 120/168]
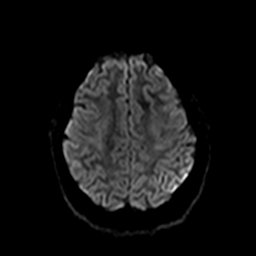
[im 144/168]
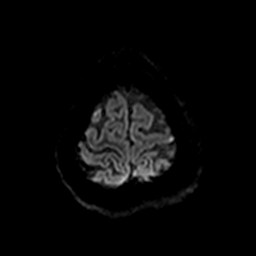
[im 168/168]
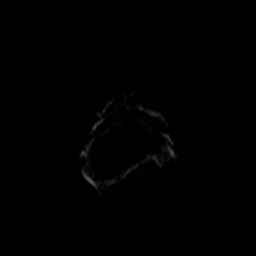

[Series 7: ax dwi_tracew · axial · 3.0mm · 0.94mm/px · z∈[-84,+63]mm · 4 of 84 slices shown]
[im 1/84]
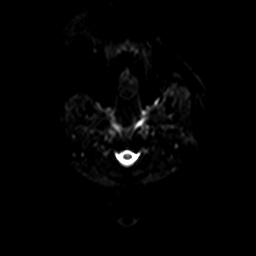
[im 28/84]
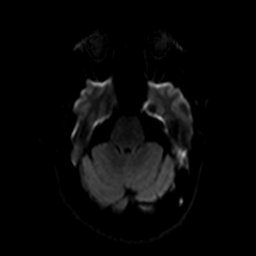
[im 56/84]
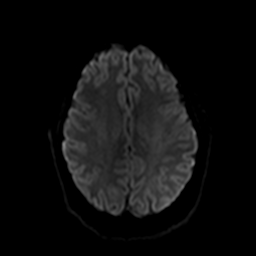
[im 84/84]
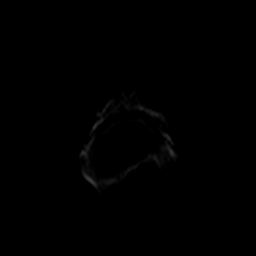

[Series 8: ax dwi_adc · axial · 3.0mm · 0.94mm/px · z∈[-84,+63]mm · 2 of 42 slices shown]
[im 1/42]
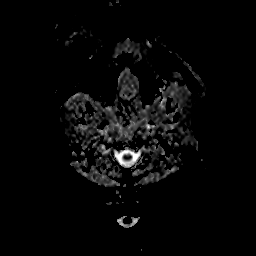
[im 42/42]
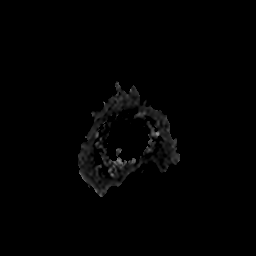

[Series 9: DWI · coronal · 5.0mm · 1.44mm/px · 3 of 70 slices shown (2 of 3)]
[im 1/70]
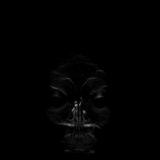
[im 35/70]
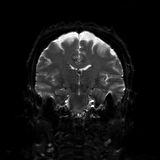
[im 70/70]
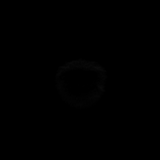

[Series 10: DWI · coronal · 5.0mm · 1.44mm/px · 2 of 35 slices shown (3 of 3)]
[im 1/35]
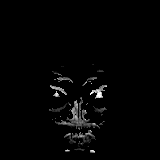
[im 35/35]
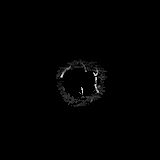

[Series 11: T2 · axial · 4.0mm · 0.36mm/px · 1 of 31 slices shown]
[im 1/31]
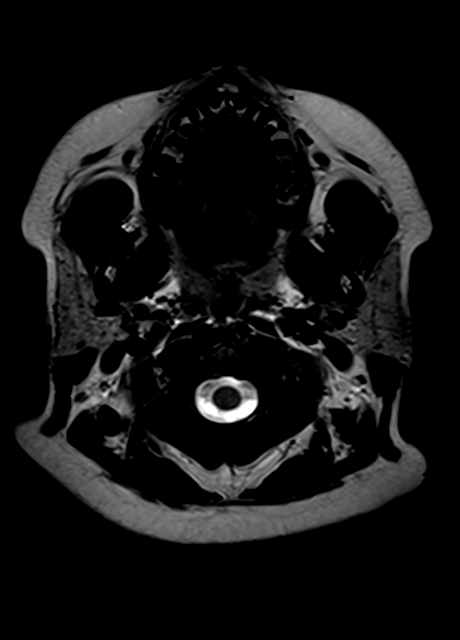

[Series 12: FLAIR · axial · 3.0mm · 0.72mm/px · 1 of 27 slices shown]
[im 1/27]
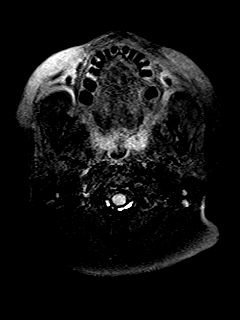

[Series 14: swi_images · axial · 1.5mm · 0.90mm/px · z∈[-86,+68]mm · 5 of 104 slices shown]
[im 1/104]
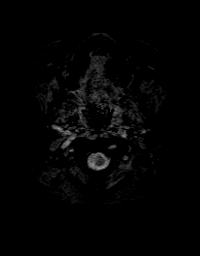
[im 26/104]
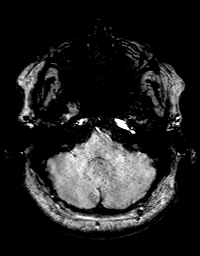
[im 52/104]
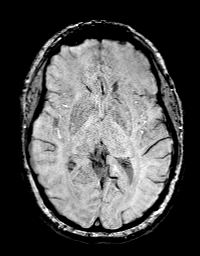
[im 78/104]
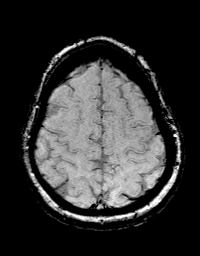
[im 104/104]
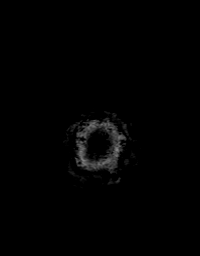

[Series 15: T1 · axial · 1.0mm · 0.98mm/px · z∈[-108,+67]mm · 8 of 176 slices shown (2 of 3)]
[im 1/176]
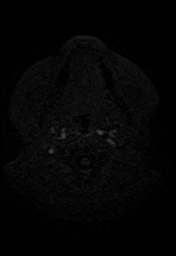
[im 26/176]
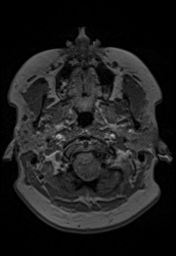
[im 51/176]
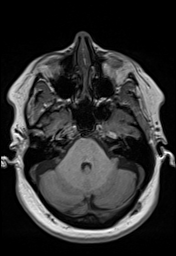
[im 76/176]
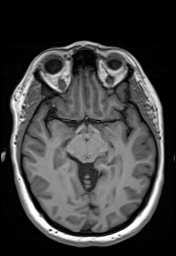
[im 101/176]
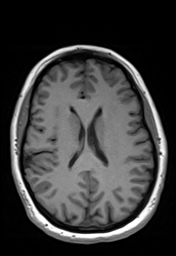
[im 126/176]
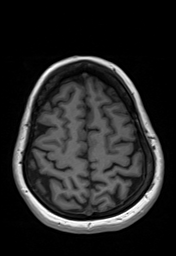
[im 151/176]
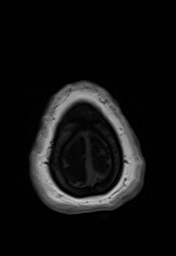
[im 176/176]
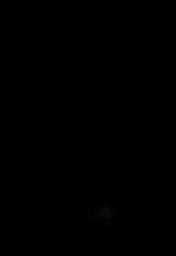

[Series 16: T2 post-contrast · coronal · 4.0mm · 0.36mm/px · 2 of 40 slices shown]
[im 1/40]
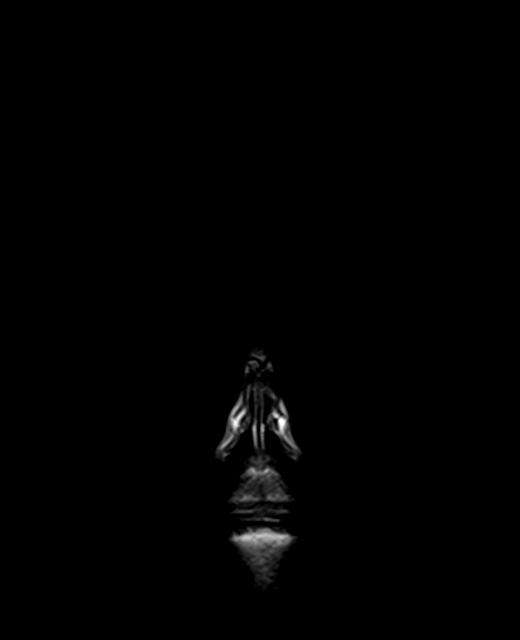
[im 40/40]
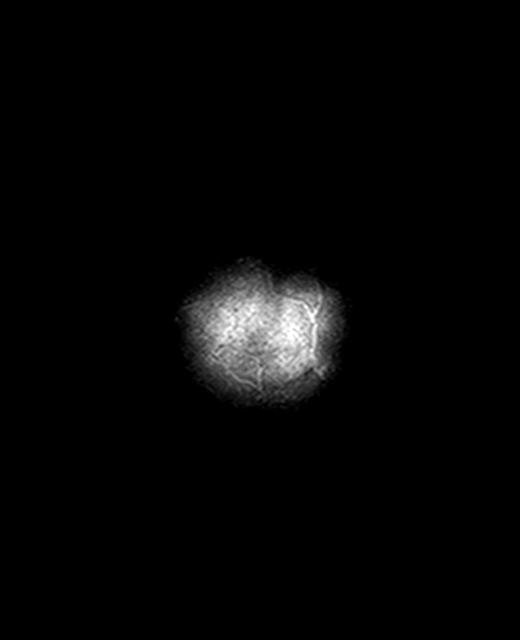

[Series 17: T1 · axial · 1.0mm · 0.98mm/px · z∈[-108,+67]mm · 8 of 176 slices shown (3 of 3)]
[im 1/176]
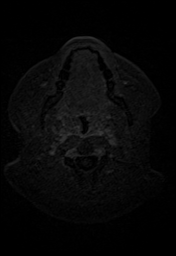
[im 26/176]
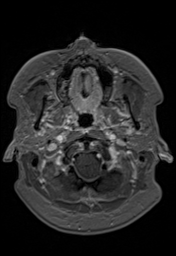
[im 51/176]
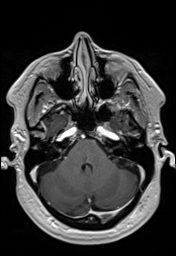
[im 76/176]
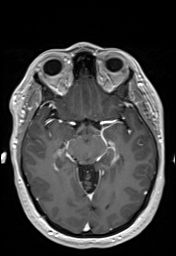
[im 101/176]
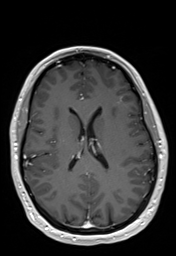
[im 126/176]
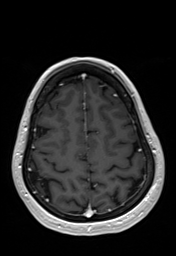
[im 151/176]
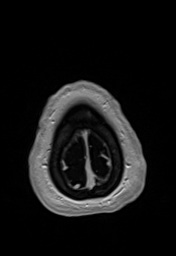
[im 176/176]
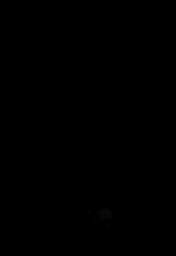

[Series 18: T1 post-contrast · coronal · 4.0mm · 0.90mm/px · 2 of 40 slices shown (1 of 2)]
[im 1/40]
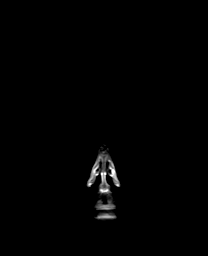
[im 40/40]
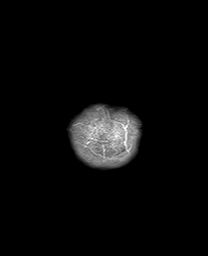

[Series 19: T1 post-contrast · sagittal · 4.0mm · 0.75mm/px · 1 of 31 slices shown (2 of 2)]
[im 1/31]
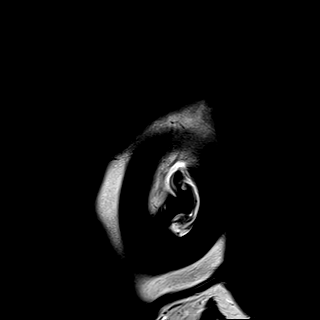

[48 of 48 positions shown; findings below may reference images not displayed]

FINDINGS: Brain: There is no evidence of an acute infarct, intracranial
hemorrhage, mass, midline shift, or extra-axial fluid collection.
There is a partially empty sella. A single punctate focus of T2
FLAIR hyperintensity in the right parietal white matter is
considered to be within normal limits for age. No abnormal brain
parenchymal or meningeal enhancement is identified. An incidental
developmental venous anomaly is noted in the left frontal lobe. The
ventricles and sulci are normal.

Vascular: Major intracranial vascular flow voids are preserved.

Skull and upper cervical spine: Unremarkable bone marrow signal.

Sinuses/Orbits: Unremarkable orbits. Paranasal sinuses and mastoid
air cells are clear.

Other: None.
IMPRESSION: 1. Partially empty sella, often an incidental finding though can be
seen with idiopathic intracranial hypertension.
2. Otherwise unremarkable appearance of the brain.

## 2021-08-24 IMAGING — MR MR NECK SOFT TISSUE ONLY WO/W CM
5 of 10 series · 19 of 48 positions shown · IV contrast (multihance)
Comparison: None.

CLINICAL DATA: Horner syndrome.  Right-sided ptosis for 1 year.

EXAM:
MRI OF THE NECK WITH CONTRAST
TECHNIQUE: Multiplanar, multisequence MR imaging was performed following the
administration of intravenous contrast.
CONTRAST:  20mL MULTIHANCE GADOBENATE DIMEGLUMINE 529 MG/ML IV SOLN

[Series 14: T1 · sagittal · 5.0mm · 0.47mm/px · 3 of 26 slices shown]
[im 1/26]
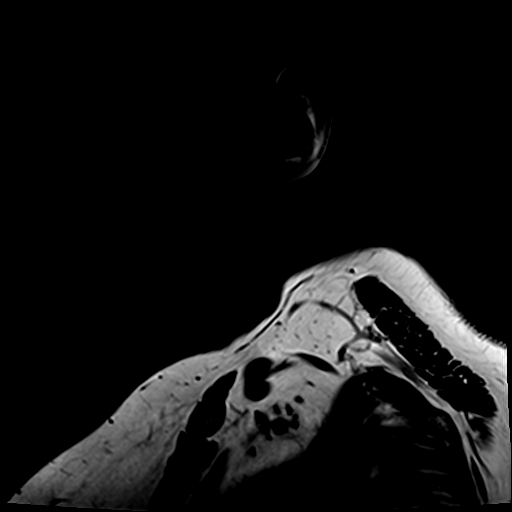
[im 13/26]
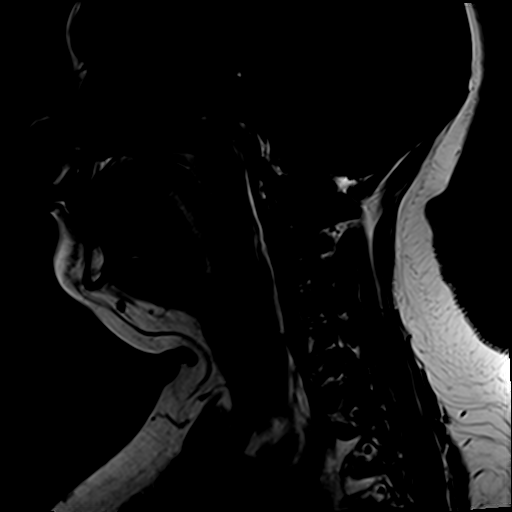
[im 26/26]
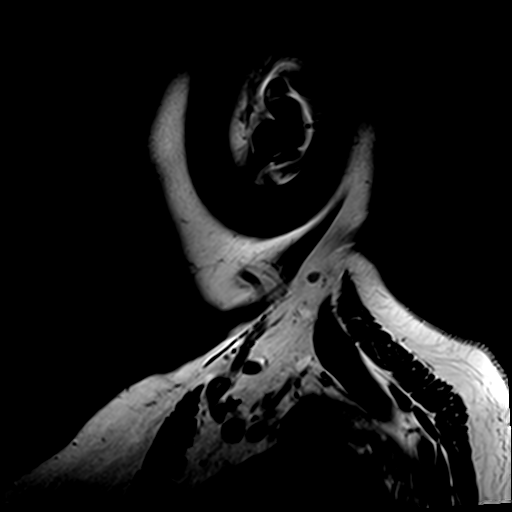

[Series 15: T2 fat-sat · sagittal · 5.0mm · 0.47mm/px · 3 of 26 slices shown (1 of 3)]
[im 1/26]
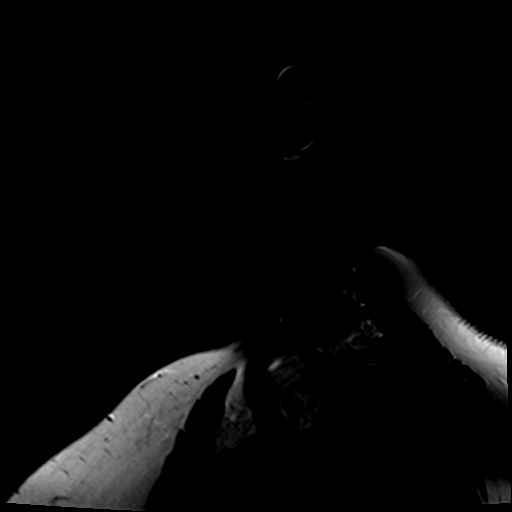
[im 13/26]
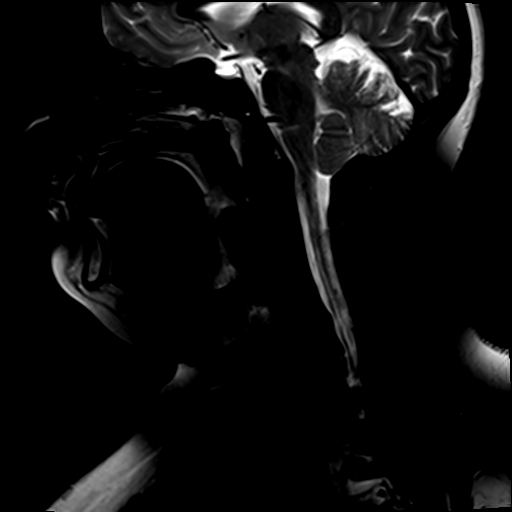
[im 26/26]
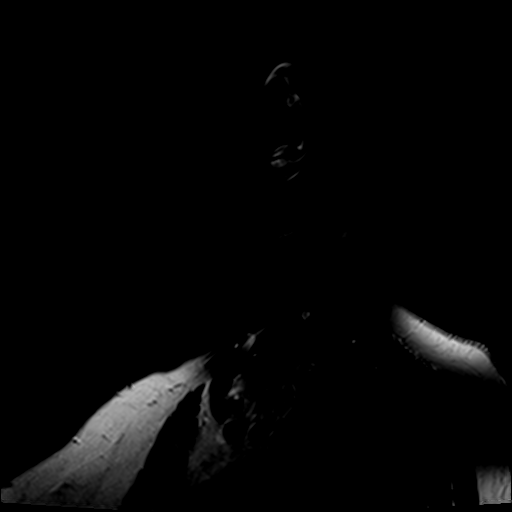

[Series 17: T2 fat-sat · coronal · 4.0mm · 0.47mm/px · 5 of 34 slices shown (2 of 3)]
[im 1/34]
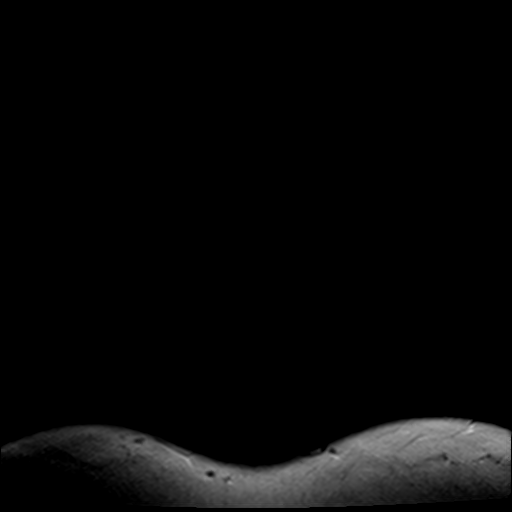
[im 9/34]
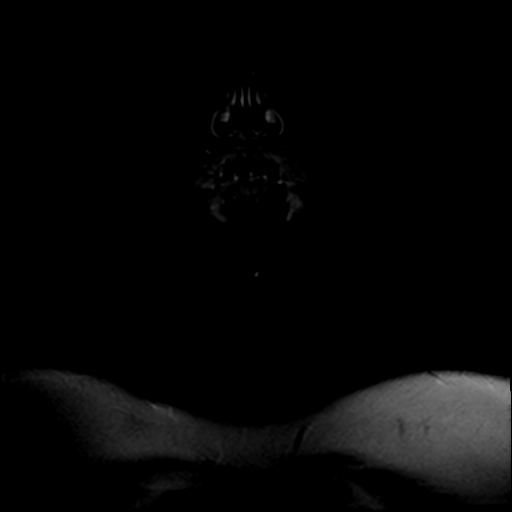
[im 17/34]
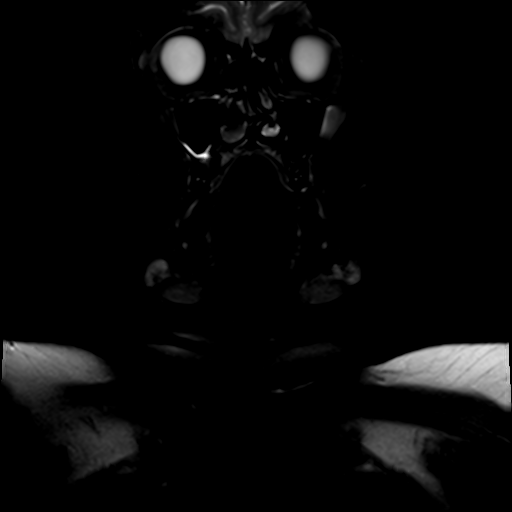
[im 25/34]
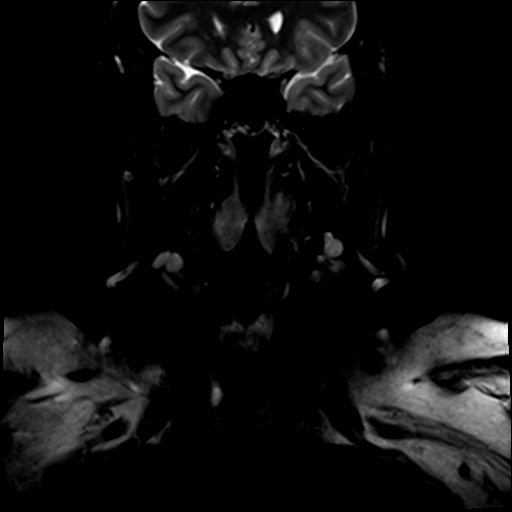
[im 34/34]
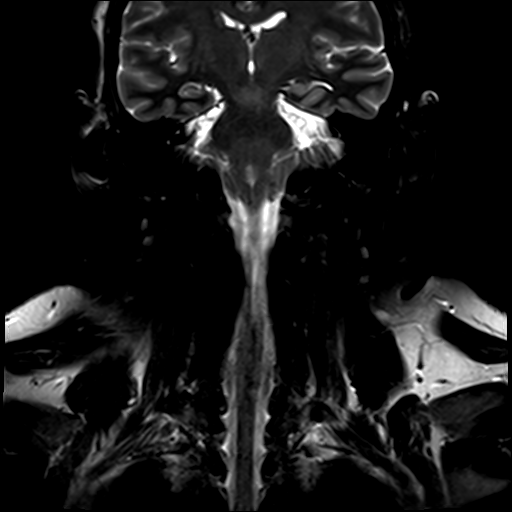

[Series 19: T2 fat-sat · axial · 5.0mm · 0.47mm/px · z∈[-255,-8]mm · 6 of 40 slices shown (3 of 3)]
[im 1/40]
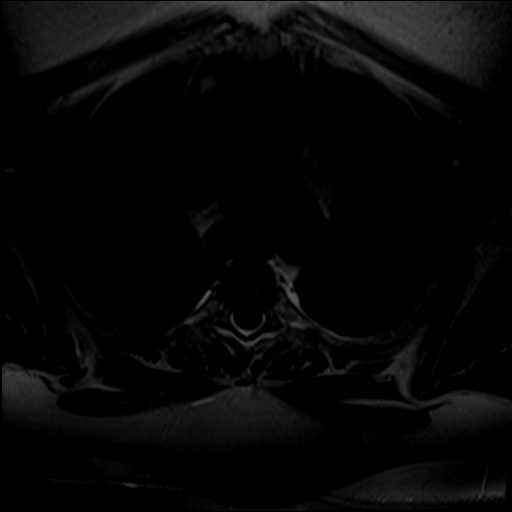
[im 8/40]
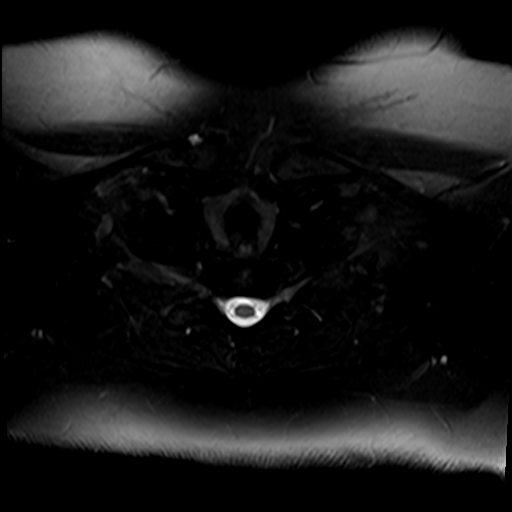
[im 16/40]
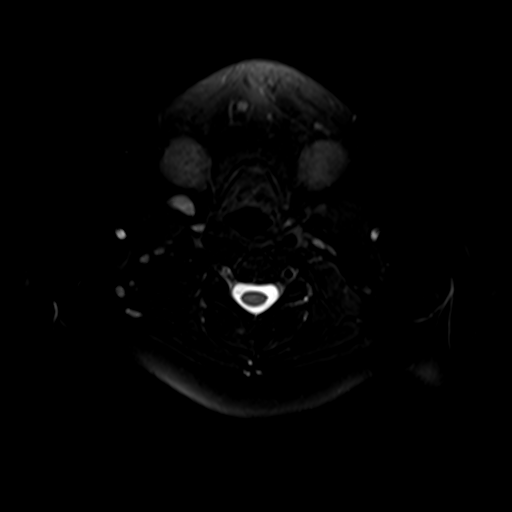
[im 24/40]
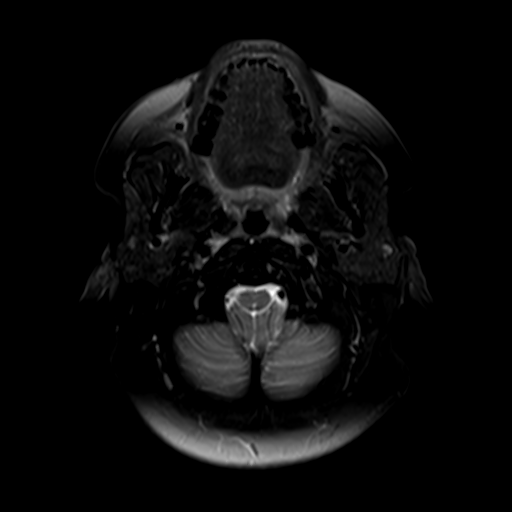
[im 32/40]
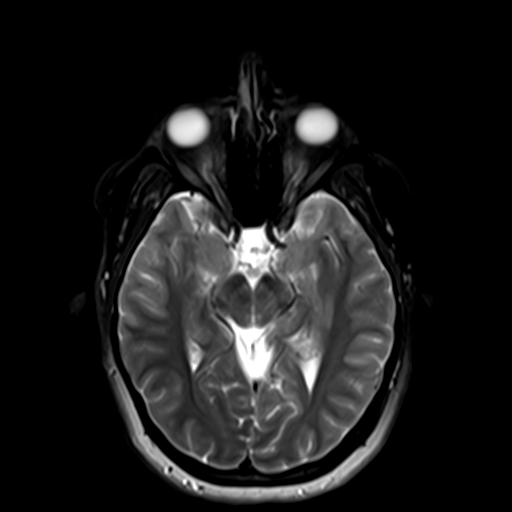
[im 40/40]
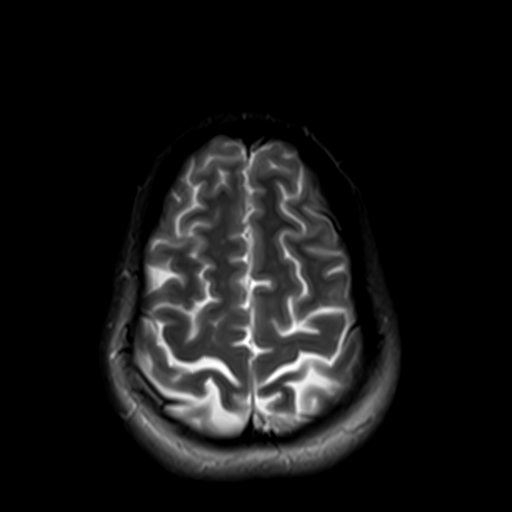

[Series 22: T1 fat-sat · sagittal · 5.0mm · 0.47mm/px · 2 of 26 slices shown]
[im 1/26]
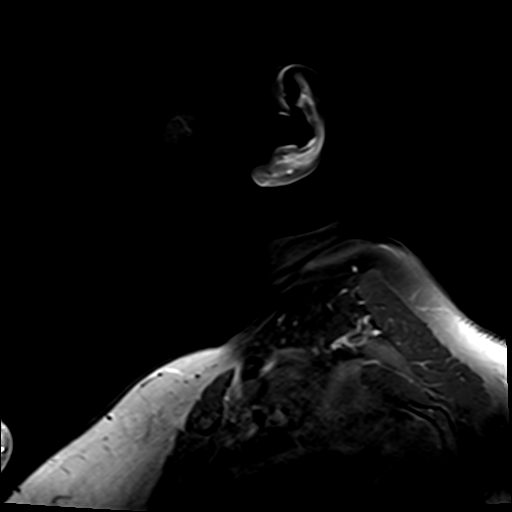
[im 9/26]
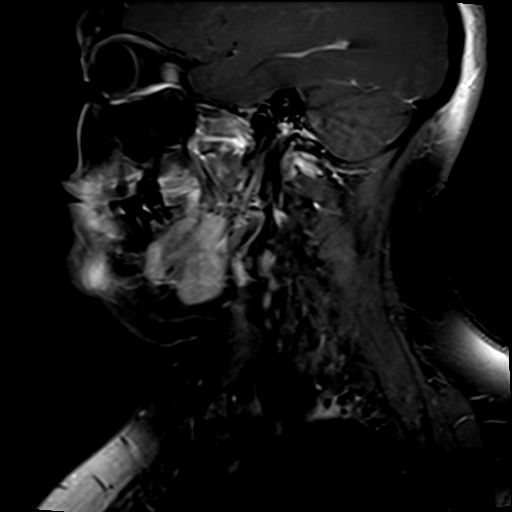

[19 of 48 positions shown; findings below may reference images not displayed]

FINDINGS: Pharynx and larynx: No evidence of a mass or swelling. Patent
airway. No fluid collection or inflammatory changes in the
parapharyngeal or retropharyngeal spaces.

Salivary glands: No mass or inflammation.

Thyroid: Incidental 9 mm T2 hyperintense right thyroid nodule for
which no follow-up imaging is recommended.

Lymph nodes: No frankly enlarged or suspicious lymph nodes in the
neck.

Vascular: Preserved bilateral carotid and left vertebral artery flow
voids. Abnormal appearance of the right vertebral artery with absent
flow void in some areas as well as some surrounding intrinsic T1
hyperintensity.

Limited intracranial: More fully evaluated on the concurrent head
MRI.

Visualized orbits: Unremarkable.

Mastoids and visualized paranasal sinuses: Clear.

Skeleton: Mild spondylosis with disc bulging and endplate spurring
at C5-6 and C6-7. No high-grade stenosis.

Upper chest: No apical lung mass.

Other: None.
IMPRESSION: 1. Abnormal appearance of the right vertebral artery. Neck CTA is
recommended to further evaluate for a possible dissection or
high-grade stenosis/occlusion.
2. No neck mass.

## 2021-08-24 MED ORDER — GADOBENATE DIMEGLUMINE 529 MG/ML IV SOLN
20.0000 mL | Freq: Once | INTRAVENOUS | Status: AC | PRN
  Administered 2021-08-24: 20 mL via INTRAVENOUS

## 2021-08-30 ENCOUNTER — Other Ambulatory Visit: Payer: Self-pay | Admitting: Ophthalmology

## 2021-08-30 DIAGNOSIS — G902 Horner's syndrome: Secondary | ICD-10-CM

## 2021-09-25 ENCOUNTER — Ambulatory Visit
Admission: RE | Admit: 2021-09-25 | Discharge: 2021-09-25 | Disposition: A | Discharge status: Home / Self Care | Payer: Self-pay | Source: Ambulatory Visit | Attending: Ophthalmology | Admitting: Ophthalmology

## 2021-09-25 DIAGNOSIS — G902 Horner's syndrome: Secondary | ICD-10-CM

## 2021-09-25 MED ORDER — IOPAMIDOL (ISOVUE-370) INJECTION 76%
75.0000 mL | Freq: Once | INTRAVENOUS | Status: AC | PRN
  Administered 2021-09-25: 75 mL via INTRAVENOUS

## 2021-11-06 ENCOUNTER — Encounter: Payer: Self-pay | Admitting: Neurology

## 2021-11-06 ENCOUNTER — Ambulatory Visit (INDEPENDENT_AMBULATORY_CARE_PROVIDER_SITE_OTHER): Payer: 59 | Admitting: Neurology

## 2021-11-06 ENCOUNTER — Telehealth: Payer: Self-pay | Admitting: Neurology

## 2021-11-06 VITALS — BP 134/83 | HR 63 | Ht 67.0 in | Wt 259.2 lb

## 2021-11-06 DIAGNOSIS — R0683 Snoring: Secondary | ICD-10-CM

## 2021-11-06 DIAGNOSIS — H539 Unspecified visual disturbance: Secondary | ICD-10-CM | POA: Diagnosis not present

## 2021-11-06 DIAGNOSIS — H4710 Unspecified papilledema: Secondary | ICD-10-CM

## 2021-11-06 DIAGNOSIS — G932 Benign intracranial hypertension: Secondary | ICD-10-CM | POA: Diagnosis not present

## 2021-11-06 DIAGNOSIS — G4719 Other hypersomnia: Secondary | ICD-10-CM

## 2021-11-06 DIAGNOSIS — R519 Headache, unspecified: Secondary | ICD-10-CM

## 2021-11-06 DIAGNOSIS — R5383 Other fatigue: Secondary | ICD-10-CM

## 2021-11-06 DIAGNOSIS — E669 Obesity, unspecified: Secondary | ICD-10-CM

## 2021-11-06 DIAGNOSIS — G441 Vascular headache, not elsewhere classified: Secondary | ICD-10-CM

## 2021-11-06 DIAGNOSIS — G08 Intracranial and intraspinal phlebitis and thrombophlebitis: Secondary | ICD-10-CM

## 2021-11-06 NOTE — Progress Notes (Addendum)
[facility]    Provider:  Dr Jaynee Eagles Requesting Provider: Monna Fam, MD Primary Care Provider:  Ollen Bowl, MD  CC:  papilledema  HPI:  Amber Cobb is a 46 y.o. 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  I reviewed had her eye care and she was seen for headache, having headaches that feel like "a brain freeze" will hurt really bad for a minute then go away, she has been going on for almost a year, patient states that her right upper lid is drooping, she has a past medical history of acid reflux, anemia, migraines, anxiety, depression, hypertension.  Examination showed visual acuity OD 20/25 and OS 20/20, pupils equally round and reactive to light no APD, extraocular movements full, it did show optic nerve edema OU and a mild ptosis, headaches going on for a year, no other neurologic symptoms, worse with hard laughing or bowel movement, they ordered an MRI of the head and neck to rule out masses and tumors, no ophthalmoplegia, discussed that she may need an LP as well.     She has a low level headache every day for 1.5 years. With pressure she gets worse symptoms of headaches, low level headache every day, mostly in the forehead, she snores heavily, headaches every day, she has a film over her eyes, she cannot lose weight, she has migraines those are different and not often, she started having migraines about 4 years ago and she was waking up with them when she gets a migraine pulsating, pounding, light and sound sensitivity and nausea but the daily headaches are different. She feels pressure in ears, hears swishing in the ears. She has gained weight. No energy. She naps every day, 3x a day, daily headaches are mild to moderate, migraines can be severe 1-2x a week, ibuprofen. Ibuprofen helps. Migraines for 4 years. She is not interested in this time with a triptan,  ibuprofen helps, discussed rebound headache with ibuprofen.No other focal neurologic deficits, associated symptoms, inciting events or modifiable factors.  Reviewed notes, labs and imaging from outside physicians, which showed:  CT angio of the neck: Showed normal vasculature of the neck, the right vertebral artery is diminutive and nondominant, normal variant, likely accounting for the findings on the prior MRI.  MRI of the neck soft tissue: Showed abnormal appearance of the right vertebral artery, neck CT was recommended as above and was unremarkable.  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 01/2021: tsh,cbc,cmp normal  Review of Systems: Patient complains of symptoms per HPI as well as the following symptoms headaches. Pertinent negatives and positives per HPI. All others negative.   Social History   Socioeconomic History   Marital status: Married    Spouse name: Not on file   Number of children: 2   Years of education: Not on file   Highest education level: Not on file  Occupational History   Not on file  Tobacco Use   Smoking status: Never   Smokeless tobacco: Never  Vaping Use   Vaping Use: Never used  Substance and Sexual Activity   Alcohol use: Not Currently    Comment: occasionally   Drug use: Never   Sexual activity: Yes  Other Topics Concern   Not on file  Social History Narrative   Not on file  Social Determinants of Health   Financial Resource Strain: Not on file  Food Insecurity: Not on file  Transportation Needs: Not on file  Physical Activity: Not on file  Stress: Not on file  Social Connections: Not on file  Intimate Partner Violence: Not on file    Family History  Problem Relation Age of Onset   Hypertension Mother    Diabetes Mother    COPD Mother    Thyroid disease Mother    Migraines Mother     Thyroid disease Sister    Migraines Sister    Thyroid disease Brother    Endometrial cancer Maternal Aunt    Lung cancer Maternal Uncle    Throat cancer Maternal Grandfather    Colon cancer Neg Hx    Esophageal cancer Neg Hx    Pancreatic cancer Neg Hx    Colon polyps Neg Hx     Past Medical History:  Diagnosis Date   Anemia    Anxiety    Carpal tunnel syndrome    Complex atypical endometrial hyperplasia    Depression    Diverticulosis    Endometrial cancer ([place])    Gallstones    Generalized anxiety disorder    GERD (gastroesophageal reflux disease)    History of kidney stones    Hypertension    Migraines    Obesity    UTI (urinary tract infection)     Patient Active Problem List   Diagnosis Date Noted   IIH (idiopathic intracranial hypertension) 11/06/2021   Chronic daily headache 11/06/2021   Endometrial cancer ([place]) 06/22/2019   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    Past Surgical History:  Procedure Laterality Date   CARPAL TUNNEL RELEASE Bilateral    CHOLECYSTECTOMY     ENDOMETRIAL BIOPSY  05/05/2019   ROBOTIC ASSISTED TOTAL HYSTERECTOMY WITH BILATERAL SALPINGO OOPHERECTOMY N/A 06/17/2019   Procedure: XI ROBOTIC ASSISTED TOTAL HYSTERECTOMY WITH BILATERAL SALPINGECTOMY AND  LYMPH NODE DISSECTION;  Surgeon: Lafonda Mosses, MD;  Location: [facility];  Service: Gynecology;  Laterality: N/A;   SENTINEL NODE BIOPSY N/A 06/17/2019   Procedure: SENTINEL NODE BIOPSY;  Surgeon: Lafonda Mosses, MD;  Location: [facility];  Service: Gynecology;  Laterality: N/A;    Current Outpatient Medications  Medication Sig Dispense Refill   amLODipine (NORVASC) 5 MG tablet Take 5 mg by mouth daily.     escitalopram (LEXAPRO) 10 MG tablet Take 10 mg by mouth daily.     hydrocortisone (ANUSOL-HC) 25 MG suppository Place 1 suppository (25 mg total) rectally every 12 (twelve) hours. 12 suppository 1   loratadine (CLARITIN) 10 MG  tablet Take 10 mg by mouth daily.     losartan (COZAAR) 100 MG tablet Take 100 mg by mouth daily.     Melatonin 5 MG CAPS Take 1 capsule by mouth at bedtime.     Multiple Vitamin (MULTIVITAMIN) capsule Take 1 capsule by mouth daily.     omeprazole (PRILOSEC) 40 MG capsule Take 40 mg by mouth daily as needed.     No current facility-administered medications for this visit.    Allergies as of 11/06/2021 - Review Complete 11/06/2021  Allergen Reaction Noted   Chloraprep one step [chlorhexidine gluconate] Hives 07/08/2019    Vitals: BP 134/83   Pulse 63   Ht '5\' 7"'$  (1.702 m)   Wt 259 lb 3.2 oz (117.6 kg)   BMI 40.60 kg/m  Last Weight:  Wt Readings from Last 1 Encounters:  11/06/21 259 lb 3.2  oz (117.6 kg)   Last Height:   Ht Readings from Last 1 Encounters:  11/06/21 '5\' 7"'$  (1.702 m)     Physical exam: Exam: Gen: NAD, conversant, well nourised, obese, well groomed                     CV: RRR, no MRG. No Carotid Bruits. No peripheral edema, warm, nontender Eyes: Conjunctivae clear without exudates or hemorrhage  Neuro: Detailed Neurologic Exam  Speech:    Speech is normal; fluent and spontaneous with normal comprehension.  Cognition:    The patient is oriented to person, place, and time;     recent and remote memory intact;     language fluent;     normal attention, concentration,     fund of knowledge Cranial Nerves:    The pupils are equal, round, and reactive to light. Optic nerve head edema. Visual fields are full to finger confrontation. Extraocular movements are intact. Trigeminal sensation is intact and the muscles of mastication are normal. The face is symmetric. The palate elevates in the midline. Hearing intact. Voice is normal. Shoulder shrug is normal. The tongue has normal motion without fasciculations.   Coordination:    Normal  Gait:   normal.   Motor Observation:    No asymmetry, no atrophy, and no involuntary movements noted. Tone:    Normal muscle  tone.    Posture:    Posture is normal. normal erect    Strength:    Strength is V/V in the upper and lower limbs.      Sensation: intact to LT     Reflex Exam:  DTR's:    Deep tendon reflexes in the upper and lower extremities are normal bilaterally.   Toes:    The toes are downgoing bilaterally.   Clonus:    Clonus is absent.    Assessment/Plan:  Patient with chronic daily headaches, optic nerve head edema and migraines. MRI brain/CTA head unremarkable. 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   -Lumbar puncture for opening pressure and labs. -Weight loss is critical, discussed weight loss and risk of permanent vision loss - CTV for venous thrombosis and start Topamax or Diamox afterwards - She snores heavily, wakes with headaches, optic nerve head edema,  She naps every day, 3x a day,: sleep evaluation  - [facility]: send referral  - She is not interested at this time with a triptan for acute migraine management, ibuprofen helps, discussed rebound headache with ibuprofen.  For any acute change especially worsening headache or vision loss call 911 and proceed to ED  To prevent or relieve headaches, try the following: Cool Compress. Lie down and place a cool compress on your head.  Avoid headache triggers. If certain foods or odors seem to have triggered your migraines in the past, avoid them. A headache diary might help you identify triggers.  Include physical activity in your daily routine. Try a daily walk or other moderate aerobic exercise.  Manage stress. Find healthy ways to cope with the stressors, such as delegating tasks on your to-do list.  Practice relaxation techniques. Try deep breathing, yoga, massage and visualization.  Eat regularly. Eating regularly scheduled meals and maintaining a healthy diet might help prevent headaches. Also, drink plenty of fluids.  Follow a regular sleep schedule. Sleep deprivation might contribute to  headaches Consider biofeedback. With this mind-body technique, you learn to control certain bodily functions -- such as muscle tension, heart rate and blood pressure --  to prevent headaches or reduce headache pain.    Proceed to emergency room if you experience new or worsening symptoms or symptoms do not resolve, if you have new neurologic symptoms or if headache is severe, or for any concerning symptom.   Provided education and documentation from American headache Society toolbox including articles on: pseudotumoer cerebri(IIH), chronic migraine medication overuse headache, chronic migraines, prevention of migraines and other headaches, behavioral and other nonpharmacologic treatments for headache.  Orders Placed This Encounter  Procedures   CT VENOGRAM HEAD   DG FL GUIDED LUMBAR PUNCTURE   Ambulatory referral to Sleep Studies   Ambulatory referral to Family Practice    Cc: Monna Fam, MD,  [place], Jarvis Newcomer, MD  Sarina Ill, MD  [facility] [address] [place], [place] [address]  Phone [phone] Fax [phone]

## 2021-11-13 ENCOUNTER — Ambulatory Visit
Admission: RE | Admit: 2021-11-13 | Discharge: 2021-11-13 | Disposition: A | Discharge status: Home / Self Care | Payer: 59 | Source: Ambulatory Visit | Attending: Neurology | Admitting: Neurology

## 2021-11-13 VITALS — BP 129/88 | HR 59

## 2021-11-13 DIAGNOSIS — R519 Headache, unspecified: Secondary | ICD-10-CM

## 2021-11-13 DIAGNOSIS — H4710 Unspecified papilledema: Secondary | ICD-10-CM

## 2021-11-13 DIAGNOSIS — G932 Benign intracranial hypertension: Secondary | ICD-10-CM

## 2021-11-13 DIAGNOSIS — H539 Unspecified visual disturbance: Secondary | ICD-10-CM

## 2021-11-13 LAB — PROTEIN, CSF: Total Protein, CSF: 29 mg/dL (ref 15–45)

## 2021-11-13 LAB — CSF CELL COUNT WITH DIFFERENTIAL
RBC Count, CSF: 14 cells/uL — ABNORMAL HIGH
TOTAL NUCLEATED CELL: 2 cells/uL (ref 0–5)

## 2021-11-13 LAB — GLUCOSE, CSF: Glucose, CSF: 56 mg/dL (ref 40–80)

## 2021-11-13 NOTE — Discharge Instructions (Signed)

## 2021-11-16 MED ORDER — TOPIRAMATE 50 MG PO TABS: 50.0000 mg | ORAL_TABLET | Freq: Two times a day (BID) | ORAL | 3 refills | Status: DC

## 2021-12-04 ENCOUNTER — Other Ambulatory Visit: Payer: Self-pay | Admitting: Neurology

## 2021-12-04 MED ORDER — TOPIRAMATE 100 MG PO TABS: 100.0000 mg | ORAL_TABLET | Freq: Two times a day (BID) | ORAL | 6 refills | Status: DC

## 2021-12-05 ENCOUNTER — Ambulatory Visit
Admission: RE | Admit: 2021-12-05 | Discharge: 2021-12-05 | Disposition: A | Discharge status: Home / Self Care | Payer: 59 | Source: Ambulatory Visit | Attending: Neurology | Admitting: Neurology

## 2021-12-05 DIAGNOSIS — H539 Unspecified visual disturbance: Secondary | ICD-10-CM

## 2021-12-05 DIAGNOSIS — H4710 Unspecified papilledema: Secondary | ICD-10-CM

## 2021-12-05 DIAGNOSIS — G441 Vascular headache, not elsewhere classified: Secondary | ICD-10-CM

## 2021-12-05 DIAGNOSIS — G932 Benign intracranial hypertension: Secondary | ICD-10-CM

## 2021-12-05 DIAGNOSIS — R519 Headache, unspecified: Secondary | ICD-10-CM

## 2021-12-05 DIAGNOSIS — G08 Intracranial and intraspinal phlebitis and thrombophlebitis: Secondary | ICD-10-CM

## 2021-12-05 MED ORDER — IOPAMIDOL (ISOVUE-370) INJECTION 76%
75.0000 mL | Freq: Once | INTRAVENOUS | Status: AC | PRN
Start: 2021-12-05 — End: 2021-12-05
  Administered 2021-12-05: 75 mL via INTRAVENOUS

## 2021-12-13 DIAGNOSIS — Z0289 Encounter for other administrative examinations: Secondary | ICD-10-CM

## 2021-12-19 ENCOUNTER — Ambulatory Visit (INDEPENDENT_AMBULATORY_CARE_PROVIDER_SITE_OTHER): Payer: 59 | Admitting: Neurology

## 2021-12-19 ENCOUNTER — Encounter: Payer: Self-pay | Admitting: Neurology

## 2021-12-19 VITALS — BP 128/84 | HR 61 | Ht 67.0 in | Wt 257.0 lb

## 2021-12-19 DIAGNOSIS — R0683 Snoring: Secondary | ICD-10-CM | POA: Diagnosis not present

## 2021-12-19 DIAGNOSIS — R519 Headache, unspecified: Secondary | ICD-10-CM

## 2021-12-19 DIAGNOSIS — G4719 Other hypersomnia: Secondary | ICD-10-CM | POA: Diagnosis not present

## 2021-12-19 DIAGNOSIS — R5382 Chronic fatigue, unspecified: Secondary | ICD-10-CM

## 2021-12-19 DIAGNOSIS — G932 Benign intracranial hypertension: Secondary | ICD-10-CM | POA: Diagnosis not present

## 2021-12-19 MED ORDER — MODAFINIL 200 MG PO TABS
200.0000 mg | ORAL_TABLET | Freq: Every day | ORAL | 2 refills | Status: AC
Start: 2021-12-19 — End: ?

## 2021-12-19 NOTE — Progress Notes (Signed)
SLEEP MEDICINE CLINIC    Provider:  Larey Seat, MD  Primary Care Physician:  Ollen Bowl, [place] [address]     Referring Provider: [name], [place],  [place] [id number]          Chief Complaint according to patient   Patient presents with:     New Patient (Initial Visit)           HISTORY OF PRESENT ILLNESS:  Amber Cobb is a 46 y.o.  Caucasian female patient seen here as a referral from dr Jaynee Eagles  on 12/19/2021. Chief concern according to patient :"  I am very sleepy, very fatigued and  have headaches while on high dose Topamax ( 200 mg/ day).  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  Anemia, Anxiety, Carpal tunnel syndrome, Complex atypical endometrial hyperplasia, Depression, Diverticulosis, Endometrial cancer ([place]), Gallstones, Generalized anxiety disorder, GERD (gastroesophageal reflux disease), History of kidney stones, Hypertension, Migraines, Obesity, and UTI (urinary tract infection).   Sleep relevant medical history: Nocturia once, waking up several times- light sleeper, prone sleeper- obesity.    Family medical /sleep history: 2 other family members on CPAP with OSA.  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 2 dogs,The patient currently works from home, since 2021 Tobacco use= never .  ETOH use - none ,  Caffeine intake in form of Coffee( /) Soda( /) Tea ( only when eating out) or energy drinks. Regular exercise in form of -nothing.     Sleep habits are as follows: The patient's dinner time is between 4-9  PM, influenced by husbands work schedule.  The patient goes to bed at 9 -12 PM and continues to sleep for 7-8 hours, wakes for one bathroom break,. The preferred sleep position is prone, with the support of 1-2 pillows.  Dreams are reportedly frequent/vivid.  The patient wakes up with an alarm at 8-9 AM.  9 AM is the usual rise time.  He/ She reports not feeling refreshed or restored in AM, with symptoms such as dry mouth, morning headaches, and residual fatigue.  Naps are taken frequently, lasting from 10 to 30 minutes and are more refreshing than nocturnal sleep.    Review of Systems: Out of a complete 14 system review, the patient complains of only the following symptoms, and all other reviewed systems are negative.:  Fatigue, sleepiness , snoring- napping daily, power naps.    How likely are you to doze in the following situations: 0 = not likely, 1 = slight chance, 2 = moderate chance, 3 = high chance   Sitting and Cobb? Watching Television? Sitting inactive in a public place (theater or meeting)? As a passenger in a car for an hour without a break? Lying down in the afternoon when circumstances permit? Sitting and talking to someone? Sitting quietly after lunch without alcohol? In a car, while stopped for a few minutes in traffic?   Total =  16/ 24 points   FSS endorsed at 60/ 63 points. Ms but no woken by dreams, no woken by headaches, but headaches are present in AM.   Vivid dream. No sleep paralysis.   Social History   Socioeconomic History   Marital status: Married    Spouse name: Amber Cobb   Number of children: 2   Years of education: Not on file   Highest education level: Associate degree: occupational, Hotel manager, or vocational program  Occupational History   Not on file  Tobacco Use   Smoking status: Never   Smokeless tobacco: Never  Vaping Use   Vaping Use: Never used  Substance and  Sexual Activity   Alcohol use: Not Currently   Drug use: Never   Sexual activity: Yes  Other Topics Concern   Not on file  Social History Narrative   Lives at home with husband and children   R handed   Caffeine: none   Social Determinants of Radio broadcast assistant Strain: Not on file  Food Insecurity: Not on file  Transportation Needs: Not on file  Physical Activity: Not on file  Stress: Not on file  Social Connections: Not on file    Family History  Problem Relation Age of Onset   Hypertension Mother    Diabetes Mother    COPD Mother    Thyroid disease Mother    Migraines Mother    Thyroid disease Sister    Migraines Sister    Thyroid disease Brother    Endometrial cancer Maternal Aunt    Lung cancer Maternal Uncle    Throat cancer Maternal Grandfather    Colon cancer Neg Hx    Esophageal cancer Neg Hx    Pancreatic cancer Neg Hx    Colon polyps Neg Hx     Past Medical History:  Diagnosis Date   Anemia    Anxiety    Carpal tunnel syndrome    Complex atypical endometrial hyperplasia    Depression    Diverticulosis    Endometrial cancer ([place])    Gallstones    Generalized anxiety disorder    GERD (gastroesophageal reflux disease)    History of kidney stones    Hypertension    Migraines    Obesity    UTI (urinary tract infection)     Past Surgical History:  Procedure Laterality Date   CARPAL TUNNEL RELEASE Bilateral    CHOLECYSTECTOMY     ENDOMETRIAL BIOPSY  05/05/2019   ROBOTIC ASSISTED TOTAL HYSTERECTOMY WITH BILATERAL SALPINGO OOPHERECTOMY N/A 06/17/2019   Procedure: XI ROBOTIC ASSISTED TOTAL HYSTERECTOMY WITH BILATERAL SALPINGECTOMY AND  LYMPH NODE DISSECTION;  Surgeon: Lafonda Mosses, MD;  Location: [facility];  Service: Gynecology;  Laterality: N/A;   SENTINEL NODE BIOPSY N/A 06/17/2019   Procedure: SENTINEL NODE BIOPSY;  Surgeon: Lafonda Mosses, MD;  Location: [facility];  Service: Gynecology;   Laterality: N/A;     Current Outpatient Medications on File Prior to Visit  Medication Sig Dispense Refill   amLODipine (NORVASC) 5 MG tablet Take 5 mg by mouth daily.     escitalopram (LEXAPRO) 10 MG tablet Take 10 mg by mouth daily.     loratadine (CLARITIN) 10 MG tablet Take 10 mg by mouth daily.     losartan (COZAAR) 100 MG tablet Take 100 mg by mouth daily.     Melatonin 5 MG CAPS Take 1 capsule by mouth at bedtime.     Multiple Vitamin (MULTIVITAMIN) capsule Take 1  capsule by mouth daily.     omeprazole (PRILOSEC) 40 MG capsule Take 40 mg by mouth daily as needed.     topiramate (TOPAMAX) 100 MG tablet Take 1 tablet (100 mg total) by mouth 2 (two) times daily. 60 tablet 6   hydrocortisone (ANUSOL-HC) 25 MG suppository Place 1 suppository (25 mg total) rectally every 12 (twelve) hours. (Patient not taking: Reported on 12/19/2021) 12 suppository 1   No current facility-administered medications on file prior to visit.    Allergies  Allergen Reactions   Chloraprep One Step [Chlorhexidine Gluconate] Hives    Physical exam:  Today's Vitals   12/19/21 0947  BP: 128/84  Pulse: 61  Weight: 257 lb (116.6 kg)  Height: '5\' 7"'$  (1.702 m)   Body mass index is 40.25 kg/m.   Wt Readings from Last 3 Encounters:  12/19/21 257 lb (116.6 kg)  11/06/21 259 lb 3.2 oz (117.6 kg)  08/11/21 256 lb (116.1 kg)     Ht Readings from Last 3 Encounters:  12/19/21 '5\' 7"'$  (1.702 m)  11/06/21 '5\' 7"'$  (1.702 m)  08/11/21 '5\' 6"'$  (1.676 m)      General: The patient is awake, alert and appears not in acute distress. The patient is well groomed. Head: Normocephalic, atraumatic. Neck is supple.  Mallampati  3,  neck circumference:15 inches . Nasal airflow patent.  Retrognathia is not seen.  Dental status: biological teeth Cardiovascular:  Regular rate and cardiac rhythm by pulse,  without distended neck veins. Respiratory: Lungs are clear to auscultation.  Skin:  Without evidence of ankle edema, or  rash. Trunk: The patient's posture is erect.   Neurologic exam : The patient is awake and alert, oriented to place and time.   Memory subjective described as intact.  Attention span & concentration ability appears normal.  Speech is fluent,  without  dysarthria, dysphonia or aphasia.  Mood and affect are appropriate.   Cranial nerves: no loss of smell or taste reported  Pupils are equal and briskly reactive to light. Funduscopic exam: deferred.  Extraocular movements in vertical and horizontal planes were intact and without nystagmus. No Diplopia. Visual fields by finger perimetry are intact. Hearing was intact to soft voice and finger rubbing.    Facial sensation intact to fine touch.  Facial motor strength is symmetric and tongue and uvula move midline.  Neck ROM : rotation, tilt and flexion extension were normal for age and shoulder shrug was symmetrical.    Motor exam:  Symmetric bulk, tone and ROM.   Normal tone without cog -wheeling, symmetric grip strength .   Sensory:  Fine touch, pinprick and vibration were tested  and  normal.  Proprioception tested in the upper extremities was normal.   Coordination: Rapid alternating movements in the fingers/hands were of normal speed.  The Finger-to-nose maneuver was intact without evidence of ataxia, dysmetria or tremor.   Gait and station: Patient could rise unassisted from a seated position, walked without assistive device.  Stance is of normal width/ base and the patient turned with 3 steps.  Toe and heel walk were deferred.  Deep tendon reflexes: in the  upper and lower extremities are symmetric and intact.  Babinski response was deferred .       After spending a total time of 45 minutes face to face and additional time for physical and neurologic examination, review of laboratory studies,  personal review of imaging studies, reports and results of other testing and review of referral information / records as  far as provided in  visit, I have established the following assessments:  1) based on Mrs. Nield report she does have a high risk of obstructive sleep apnea to be present and obstructive sleep apnea also contributes to intracranial pressure elevation.  Morning headaches are present but not headaches that wake her out of sleep and this again is a correlation to CO2 retention or hypoxia which is strongly correlated to hypoventilation and sleep.  I would like for her to undergo an attended sleep study as a split-night protocol to be able to initiate treatment right away.  I will set the apnea hypopnea index for a split study at 20.  The patient likes to sleep in prone position and uses 1 or 2 pillows to prop herself up at night.  We do not need to assign a special bedroom to her.  I would like to add that her degree of excessive sleepiness and fatigue is very concerning to me and that her headaches have not improved as much on high-dose topiramate as would be expected is also of concern.  She has had a low level headache daily for almost 2 years now.  She also feels pressure in her ears she hears a swishing pulsating sound in the ears a pulsatile tinnitus.  This could be related to hypertension as well.  An MRI of the neck and brain had been obtained and was reviewed by Dr. Lavell Anchors prior to her referral to me.  The patient had also discussed healthy weight and wellness center referral.  Her lumbar puncture revealed an opening pressure of 26 cmH2O which is highly elevated and she started on Topamax afterwards a CT venogram had been ordered and was normal there was no sinus venous thrombosis.  He also gave the patient some advice as to include physical activity daily routines of sleep at bedtime a rise time regular mealtimes and biofeedback.  If Christella Scheuermann will not approve an in lab sleep study I would definitely opt for the home sleep test as a second option but it would likely delay the initiation of therapy.  We briefly talked about  dental and inspire therapy.    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 2) first choice is a SPLIT PSG at [facility] 20/h.  3) second plan B is HST  4) already referred to weight and wellness , Dr Leafy Ro .   I would like to thank Pall[place], Jarvis Newcomer, MD and [name], [place],  [place] [address] for allowing me to meet with and to take care of this pleasant patient.   In short, Sumie Remsen is presenting with excessive sleepiness, fatigue, daily morning headaches, ICH, symptom that can be attributed to OSA, and hypoventilation .   I plan to follow up either personally or through our NP within 2-3 months.   CC: I will share my notes with Dr Jaynee Eagles, MD.  Electronically signed by: Larey Seat, MD 12/19/2021 10:03 AM  [facility] and [employer] Board certified by The [employer] of Sleep Medicine and Diplomate of the [employer] of Sleep Medicine. Board certified In Neurology through the [place], Fellow of the [employer] of Neurology. Medical Director of [employer].

## 2021-12-19 NOTE — Patient Instructions (Addendum)
Modafinil Tablets What is this medication? MODAFINIL (moe DAF i nil) treats sleep disorders, such as narcolepsy, obstructive sleep apnea, and shift work disorder. It works by promoting wakefulness. It belongs to a group of medications called stimulants. This medicine may be used for other purposes; ask your health care provider or pharmacist if you have questions. COMMON BRAND NAME(S): Provigil What should I tell my care team before I take this medication? They need to know if you have any of these conditions: Kidney disease Liver disease Mental health conditions An unusual or allergic reaction to modafinil, other medications, foods, dyes, or preservatives Pregnant or trying to get pregnant Breast-feeding How should I use this medication? Take this medication by mouth with water. Take it as directed on the prescription label at the same time every day. You can take it with or without food. If it upsets your stomach, take it with food. Keep taking it unless your care team tells you to stop. A special MedGuide will be given to you by the pharmacist with each prescription and refill. Be sure to read this information carefully each time. Talk to your care team about the use of this medication in children. Special care may be needed. Overdosage: If you think you have taken too much of this medicine contact a poison control center or emergency room at once. NOTE: This medicine is only for you. Do not share this medicine with others. What if I miss a dose? If you miss a dose, take it as soon as you can. If it is almost time for your next dose, take only that dose. Do not take double or extra doses. What may interact with this medication? Do not take this medication with any of the following: Amphetamine or dextroamphetamine Dexmethylphenidate or methylphenidate MAOIs, such as Marplan, Nardil, and Parnate Pemoline Procarbazine This medication may also interact with the  following: Antifungal medications, such as itraconazole or ketoconazole Barbiturates, such as phenobarbital Carbamazepine Cyclosporine Diazepam Estrogen or progestin hormones Medications for mental health conditions Phenytoin Propranolol Triazolam Warfarin This list may not describe all possible interactions. Give your health care provider a list of all the medicines, herbs, non-prescription drugs, or dietary supplements you use. Also tell them if you smoke, drink alcohol, or use illegal drugs. Some items may interact with your medicine. What should I watch for while using this medication? Visit your care team for regular checks on your progress. It may be some time before you see the benefit from this medication. This medication may affect your coordination, reaction time, or judgment. It may also hide signs that you are tired. This medication will not eliminate your abnormal tendency to fall asleep and is not a replacement for sleep. Do not drive or operate machinery until you know how this medication affects you. Sit up or stand slowly to reduce the risk of dizzy or fainting spells. Drinking alcohol with this medication can increase the risk of these side effects. This medication may cause serious skin reactions. They can happen weeks to months after starting the medication. Contact your care team right away if you notice fevers or flu-like symptoms with a rash. The rash may be red or purple and then turn into blisters or peeling of the skin. You may also notice a red rash with swelling of the face, lips, or lymph nodes in your neck or under your arms. Estrogen and progestin hormones may not work as well while you are taking this medication. If you are using  these hormones for contraception, talk to your care team about using a second type of contraception. A barrier contraceptive, such as a condom or diaphragm, is recommended. It is unknown if the effects of this medication will be increased by  the use of caffeine. Caffeine is found in many foods, beverages, and medications. Ask your care team if you should limit or change your intake of caffeine-containing products while on this medication. What side effects may I notice from receiving this medication? Side effects that you should report to your care team as soon as possible: Allergic reactions or angioedema--skin rash, itching or hives, swelling of the face, eyes, lips, tongue, arms, or legs, trouble swallowing or breathing Increase in blood pressure Mood and behavior changes--anxiety, nervousness, confusion, hallucinations, irritability, hostility, thoughts of suicide or self-harm, worsening mood, feelings of depression Rash, fever, and swollen lymph nodes Redness, blistering, peeling, or loosening of the skin, including inside the mouth Side effects that usually do not require medical attention (report to your care team if they continue or are bothersome): Anxiety, nervousness Dizziness Headache Nausea Trouble sleeping This list may not describe all possible side effects. Call your doctor for medical advice about side effects. You may report side effects to FDA at 1-800-FDA-1088. Where should I keep my medication? Keep out of the reach of children and pets. This medication can be abused. Keep it in a safe place to protect it from theft. Do not share it with anyone. It is only for you. Selling or giving away this medication is dangerous and against the law. Store at room temperature between 20 and 25 degrees C (68 and 77 degrees F). Get rid of any unused medication after the expiration date. This medication may cause harm and death if it is taken by other adults, children, or pets. It is important to get rid of the medication as soon as you no longer need it or it is expired. You can do this in two ways: Take the medication to a medication take-back program. Check with your pharmacy or law enforcement to find a location. If you  cannot return the medication, check the label or package insert to see if the medication should be thrown out in the garbage or flushed down the toilet. If you are not sure, ask your care team. If it is safe to put it in the trash, take the medication out of the container. Mix the medication with cat litter, dirt, coffee grounds, or other unwanted substance. Seal the mixture in a bag or container. Put it in the trash. NOTE: This sheet is a summary. It may not cover all possible information. If you have questions about this medicine, talk to your doctor, pharmacist, or health care provider.  2023 Elsevier/Gold Standard (2021-06-08 00:00:00) Screening for Sleep Apnea  Sleep apnea is a condition in which breathing pauses or becomes shallow during sleep. Sleep apnea screening is a test to determine if you are at risk for sleep apnea. The test includes a series of questions. It will only takes a few minutes. Your health care provider may ask you to have this test in preparation for surgery or as part of a physical exam. What are the symptoms of sleep apnea? Common symptoms of sleep apnea include: Snoring. Waking up often at night. Daytime sleepiness. Pauses in breathing. Choking or gasping during sleep. Irritability. Forgetfulness. Trouble thinking clearly. Depression. Personality changes. Most people with sleep apnea do not know that they have it. What are the advantages of sleep  apnea screening? Getting screened for sleep apnea can help: Ensure your safety. It is important for your health care providers to know whether or not you have sleep apnea, especially if you are having surgery or have other long-term (chronic) health conditions. Improve your health and allow you to get a better night's rest. Restful sleep can help you: Have more energy. Lose weight. Improve high blood pressure. Improve diabetes management. Prevent stroke. Prevent car accidents. What happens during the  screening? Screening usually includes being asked a list of questions about your sleep quality. Some questions you may be asked include: Do you snore? Is your sleep restless? Do you have daytime sleepiness? Has a partner or spouse told you that you stop breathing during sleep? Have you had trouble concentrating or memory loss? What is your age? What is your neck circumference? To measure your neck, keep your back straight and gently wrap the tape measure around your neck. Put the tape measure at the middle of your neck, between your chin and collarbone. What is your sex assigned at birth? Do you have or are you being treated for high blood pressure? If your screening test is positive, you are at risk for the condition. Further testing may be needed to confirm a diagnosis of sleep apnea. Where to find more information You can find screening tools online or at your health care clinic. For more information about sleep apnea screening and healthy sleep, visit these websites: Centers for Disease Control and Prevention: http://www.wolf.info/ American Sleep Apnea Association: www.sleepapnea.org Contact a health care provider if: You think that you may have sleep apnea. Summary Sleep apnea screening can help determine if you are at risk for sleep apnea. It is important for your health care providers to know whether or not you have sleep apnea, especially if you are having surgery or have other chronic health conditions. You may be asked to take a screening test for sleep apnea in preparation for surgery or as part of a physical exam. This information is not intended to replace advice given to you by your health care provider. Make sure you discuss any questions you have with your health care provider. Document Revised: 04/08/2020 Document Reviewed: 04/08/2020 Elsevier Patient Education  [place]. Idiopathic Intracranial Hypertension  Idiopathic intracranial hypertension (IIH) is a condition that  increases pressure around the brain. The fluid that surrounds the brain and spinal cord (cerebrospinal fluid, or CSF) increases and causes the pressure. Idiopathic means that the cause of this condition is not known. IIH affects the brain and spinal cord (neurological disorder). If this condition is not treated, it can cause vision loss or blindness. What are the causes? The cause of this condition is not known. What increases the risk? The following factors may make you more likely to develop this condition: Being very overweight (obese). Being a female between the ages of 21 and 36 years old, who has not gone through menopause. Taking certain medicines, such as birth control or steroids. What are the signs or symptoms? Symptoms of this condition include: Headaches. This is the most common symptom. Brief episodes of total blindness. Double vision, blurred vision, or poor side (peripheral) vision. Pain in the shoulders or neck. Nausea and vomiting. A sound like rushing water or a pulsing sound within the ears (pulsatile tinnitus), or ringing in the ears. How is this diagnosed? This condition may be diagnosed based on: Your symptoms and medical history. Imaging tests of the brain, such as: CT scan. MRI. Magnetic  resonance venogram (MRV) to check the veins. Diagnostic lumbar puncture. This is a procedure to remove and examine a sample of cerebrospinal fluid. This procedure can determine whether too much fluid may be causing IIH. A thorough eye exam to check for swelling or nerve damage in the eyes. How is this treated? Treatment for this condition depends on the symptoms. The goal of treatment is to decrease the pressure around your brain. Common treatments include: Weight loss through healthy eating, salt restriction, and exercise, if you are overweight. Medicines to decrease the production of spinal fluid and lower the pressure within your skull. Medicines to prevent or treat  headaches. Other treatments may include: Surgery to place drains (shunts) in your brain for removing excess fluid. Lumbar puncture to remove excess cerebrospinal fluid. Follow these instructions at home: If you are overweight or obese, work with your health care provider to lose weight. Take over-the-counter and prescription medicines only as told by your health care provider. Ask your health care provider if the medicine prescribed to you requires you to avoid driving or using machinery. Do not use any products that contain nicotine or tobacco, such as cigarettes, e-cigarettes, and chewing tobacco. If you need help quitting, ask your health care provider. Keep all follow-up visits as told by your health care provider. This is important. Contact a health care provider if: You have changes in your vision, such as: Double vision. Blurred vision. Poor peripheral vision. Get help right away if: You have any of the following symptoms and they get worse or do not get better: Headaches. Nausea. Vomiting. Sudden trouble seeing. Summary Idiopathic intracranial hypertension (IIH) is a condition that increases pressure around the brain. The cause is not known (is idiopathic). The most common symptom of IIH is headaches. Vision changes, pain in the shoulders or neck, nausea, and vomiting may also occur. Treatment for this condition depends on your symptoms. The goal of treatment is to decrease the pressure around your brain. If you are overweight or obese, work with your health care provider to lose weight. Take over-the-counter and prescription medicines only as told by your health care provider. This information is not intended to replace advice given to you by your health care provider. Make sure you discuss any questions you have with your health care provider. Document Revised: 04/11/2019 Document Reviewed: 04/11/2019 Elsevier Patient Education  [place].

## 2021-12-22 ENCOUNTER — Encounter: Payer: Self-pay | Admitting: Gynecologic Oncology

## 2021-12-25 ENCOUNTER — Encounter: Payer: Self-pay | Admitting: Gynecologic Oncology

## 2021-12-25 ENCOUNTER — Inpatient Hospital Stay: Payer: 59 | Attending: Gynecologic Oncology | Admitting: Gynecologic Oncology

## 2021-12-25 VITALS — BP 120/63 | HR 64 | Temp 98.6°F | Wt 257.8 lb

## 2021-12-25 DIAGNOSIS — Z90710 Acquired absence of both cervix and uterus: Secondary | ICD-10-CM | POA: Insufficient documentation

## 2021-12-25 DIAGNOSIS — Z8542 Personal history of malignant neoplasm of other parts of uterus: Secondary | ICD-10-CM | POA: Diagnosis present

## 2021-12-25 DIAGNOSIS — Z90722 Acquired absence of ovaries, bilateral: Secondary | ICD-10-CM | POA: Insufficient documentation

## 2021-12-25 DIAGNOSIS — C541 Malignant neoplasm of endometrium: Secondary | ICD-10-CM

## 2021-12-25 NOTE — Patient Instructions (Signed)
It was good to see you today.  I do not see or feel any evidence of cancer recurrence on your exam.  I'd like to start alternating visits between my office and your OB/GYN.  Please reach out to make a visit with your OB/GYN in 6 months.  Please call me sometime over the summer next year to schedule a visit in August 2024.  As always, if you develop new and concerning symptoms before your next visit, please call to see me sooner.

## 2021-12-25 NOTE — Progress Notes (Signed)
Gynecologic Oncology Return Clinic Visit  12/25/2021  Reason for Visit: surveillance visit in the setting of early-stage uterine cancer  Treatment History: Oncology History Overview Note  MSI-stable   Endometrial cancer ([place])  05/06/2019 Initial Biopsy   EMB: at least [facility]   05/21/2019 Imaging   CT abdomen/pelvis: Endometrial thickening, compatible with reported history of endometrial cancer.  No evidence of metastatic disease in the abdomen or pelvis.  7 mm hypodensity in the uncinate process, indeterminate and likely benign.  MRI of the abdomen with and without contrast could be used to further evaluate.   06/17/2019 Surgery   TRH/BS, SLN: On EUA, mobile 8-10 cm uterus, no adnexal masses. On intra-abdominal entry, normal appearing liver edge, diaphragm and omentum. Normal small and large bowel, appendix, and colon. Uterus 10cm and bulbous at the fundus. Normal appearing bilateral adnexa. Mapping successful to bilateral SLNs; external iliac SLN somewhat enlarged. No intra-abdominal or pelvic evidence of disease.    06/17/2019 Initial Diagnosis   Endometrial cancer ([place])   06/17/2019 Pathologic Stage   IA, grade 1, no LVSI, negative SLNs. IHC intact.  A. LYMPH NODE, SENTINEL, RIGHT EXTERNAL ILIAC, BIOPSY:  -  Benign adipose tissue  -  No lymphoid tissue identified   B. LYMPH NODE, SENTINEL, RIGHT OBTURATOR, BIOPSY:  -  No carcinoma identified in one lymph node (0/1)  -  See comment   C. LYMPH NODE, RIGHT OBTURATOR, BIOPSY:  -  No carcinoma identified in one lymph node (0/1)  -  See comment   D. LYMPH NODE, SENTINEL, LEFT EXTERNAL ILIAC, BIOPSY:  -  No carcinoma identified in one lymph node (0/1)  -  See comment   E. UTERUS, CERVIX, BILATERAL FALLOPIAN TUBES, HYSTERECTOMY WITH  SALPINGECTOMY:  Uterus:  -  Endometrioid carcinoma, FIGO grade 1  -  Carcinoma confined to the endometrium  -  See oncology table and comment below   Cervix:  -  No carcinoma identified   Bilateral  Fallopian tubes:  -  Benign fallopian tubes with paratubal cysts  -  No carcinoma identified   COMMENT:   A.  Dr. Vic Ripper reviewed the case and agrees with the above diagnosis.   B-D.  Cytokeratin AE1/3 was performed on the sentinel lymph nodes to  exclude micrometastasis.  There is no evidence of metastatic carcinoma  by immunohistochemistry.   ONCOLOGY TABLE:   UTERUS, CARCINOMA OR CARCINOSARCOMA   Procedure: Total hysterectomy and bilateral salpingectomy  Histologic type: Endometrioid carcinoma  Histologic Grade: FIGO grade 1  Myometrial invasion: Carcinoma limited to the endometrium  Uterine Serosa Involvement: Not identified  Cervical stromal involvement: Not identified  Extent of involvement of other organs: Not identified  Lymphovascular invasion: Not identified  Regional Lymph Nodes:       Examined:     3 Sentinel                               0 non-sentinel                               3 total        Lymph nodes with metastasis: 0        Isolated tumor cells (<0.2 mm): 0        Micrometastasis:  (>0.2 mm and < 2.0 mm): 0        Macrometastasis: (>2.0 mm): 0  Extracapsular extension: N/A  Representative Tumor Block: E4  MMR / MSI testing: Will be ordered  Pathologic Stage Classification (pTNM, AJCC 8th edition):  pTIa, pN0  Comments: See above      Interval History: Had a colonoscopy at the end of March in the setting of rectal bleeding.  This was notable for nonbleeding internal hemorrhoids as well as external hemorrhoids on perianal exam.  There were multiple small enlarged diverticula within the sigmoid colon, descending colon, transverse colon, and ascending colon.  Is also seen to neurologist since her last visit with me and is set up with [facility].  She reports overall doing well.  Denies any abdominal or pelvic pain.  Has had infrequent rectal bleeding.  Reports regular bowel function.  Has had some  increased urinary frequency since starting Topamax.  This medication is also cause some increased fatigue.  Past Medical/Surgical History: Past Medical History:  Diagnosis Date   Anemia    Anxiety    Carpal tunnel syndrome    Complex atypical endometrial hyperplasia    Depression    Diverticulosis    Endometrial cancer ([place])    Gallstones    Generalized anxiety disorder    GERD (gastroesophageal reflux disease)    History of kidney stones    Hypertension    Migraines    Obesity    UTI (urinary tract infection)     Past Surgical History:  Procedure Laterality Date   CARPAL TUNNEL RELEASE Bilateral    CHOLECYSTECTOMY     ENDOMETRIAL BIOPSY  05/05/2019   ROBOTIC ASSISTED TOTAL HYSTERECTOMY WITH BILATERAL SALPINGO OOPHERECTOMY N/A 06/17/2019   Procedure: XI ROBOTIC ASSISTED TOTAL HYSTERECTOMY WITH BILATERAL SALPINGECTOMY AND  LYMPH NODE DISSECTION;  Surgeon: Lafonda Mosses, MD;  Location: [place];  Service: Gynecology;  Laterality: N/A;   SENTINEL NODE BIOPSY N/A 06/17/2019   Procedure: SENTINEL NODE BIOPSY;  Surgeon: Lafonda Mosses, MD;  Location: [facility];  Service: Gynecology;  Laterality: N/A;    Family History  Problem Relation Age of Onset   Hypertension Mother    Diabetes Mother    COPD Mother    Thyroid disease Mother    Migraines Mother    Thyroid disease Sister    Migraines Sister    Thyroid disease Brother    Endometrial cancer Maternal Aunt    Lung cancer Maternal Uncle    Throat cancer Maternal Grandfather    Colon cancer Neg Hx    Esophageal cancer Neg Hx    Pancreatic cancer Neg Hx    Colon polyps Neg Hx     Social History   Socioeconomic History   Marital status: Married    Spouse name: Timothy   Number of children: 2   Years of education: Not on file   Highest education level: Associate degree: occupational, Hotel manager, or vocational program  Occupational History   Not on file  Tobacco Use    Smoking status: Never   Smokeless tobacco: Never  Vaping Use   Vaping Use: Never used  Substance and Sexual Activity   Alcohol use: Not Currently   Drug use: Never   Sexual activity: Yes  Other Topics Concern   Not on file  Social History Narrative   Lives at home with husband and children   R handed   Caffeine: none   Social Determinants of Health   Financial Resource Strain: Not on file  Food Insecurity: Not  on file  Transportation Needs: Not on file  Physical Activity: Not on file  Stress: Not on file  Social Connections: Not on file    Current Medications:  Current Outpatient Medications:    amLODipine (NORVASC) 5 MG tablet, Take 5 mg by mouth daily., Disp: , Rfl:    escitalopram (LEXAPRO) 10 MG tablet, Take 10 mg by mouth daily., Disp: , Rfl:    hydrocortisone (ANUSOL-HC) 25 MG suppository, Place 1 suppository (25 mg total) rectally every 12 (twelve) hours., Disp: 12 suppository, Rfl: 1   loratadine (CLARITIN) 10 MG tablet, Take 10 mg by mouth daily., Disp: , Rfl:    losartan (COZAAR) 100 MG tablet, Take 100 mg by mouth daily., Disp: , Rfl:    Melatonin 5 MG CAPS, Take 1 capsule by mouth at bedtime., Disp: , Rfl:    modafinil (PROVIGIL) 200 MG tablet, Take 1 tablet (200 mg total) by mouth daily., Disp: 30 tablet, Rfl: 2   Multiple Vitamin (MULTIVITAMIN) capsule, Take 1 capsule by mouth daily., Disp: , Rfl:    omeprazole (PRILOSEC) 40 MG capsule, Take 40 mg by mouth daily as needed., Disp: , Rfl:    topiramate (TOPAMAX) 100 MG tablet, Take 1 tablet (100 mg total) by mouth 2 (two) times daily., Disp: 60 tablet, Rfl: 6  Review of Systems: Denies appetite changes, fevers, chills, unexplained weight changes. Denies hearing loss, neck lumps or masses, mouth sores, ringing in ears or voice changes. Denies cough or wheezing.  Denies shortness of breath. Denies chest pain or palpitations. Denies leg swelling. Denies abdominal distention, pain, blood in stools, constipation,  diarrhea, nausea, vomiting, or early satiety. Denies pain with intercourse, dysuria, frequency, hematuria or incontinence. Denies hot flashes, pelvic pain, vaginal bleeding or vaginal discharge.   Denies joint pain, back pain or muscle pain/cramps. Denies itching, rash, or wounds. Denies dizziness, headaches, numbness or seizures. Denies swollen lymph nodes or glands, denies easy bruising or bleeding. Denies anxiety, depression, confusion, or decreased concentration.  Physical Exam: BP 120/63 (BP Location: Left Arm, Patient Position: Sitting, Cuff Size: Large)   Pulse 64   Temp 98.6 F (37 C) (Axillary)   Wt 257 lb 12.8 oz (116.9 kg)   BMI 40.38 kg/m  General: Alert, oriented, no acute distress. HEENT: Normocephalic, atraumatic sclera anicteric. Chest: Clear to auscultation bilaterally.  No wheezes or rhonchi. Cardiovascular: Regular rate and rhythm, no murmurs. Abdomen: Obese, soft, nontender.  Normoactive bowel sounds.  No masses or hepatosplenomegaly appreciated.  Well-healed incisions. Extremities: Grossly normal range of motion.  Warm, well perfused.  No edema bilaterally. Skin: No rashes or lesions noted. Lymphatics: No cervical, supraclavicular, or inguinal adenopathy. GU: Normal appearing external genitalia without erythema, excoriation, or lesions.  Speculum exam reveals well rugated vaginal mucosa, no lesions or masses noted.  No bleeding or discharge.  Bimanual exam reveals cuff intact, no nodularity or masses.  Rectovaginal exam  confirms findings.  Laboratory & Radiologic Studies: None new  Assessment & Plan: Amber Cobb is a 46 y.o. woman with Stage IA, grade 1 endometrioid endometrial adenocarcinoma, MSI stable, who presents for surveillance visit today.  Surgery 06/2019.   Patient is overall doing well and is NED on exam today.   From a cancer surveillance standpoint, we will continue with visits every 6 months per NCCN surveillance recommendations.  Given her  early stage, low risk disease, I recommend that we transition to visits alternating between my office and her OB/GYN as she is more than 2 years out from her  surgery.  I will plan to see her for surveillance visit in 1 year.  I reviewed with patient the symptoms that would be concerning for disease recurrence that should prompt a phone call before her next scheduled visit.   Congratulated her on the many changes she is made in her life recently.  20 minutes of total time was spent for this patient encounter, including preparation, face-to-face counseling with the patient and coordination of care, and documentation of the encounter.  Jeral Pinch, MD  Division of Gynecologic Oncology  Department of Obstetrics and Gynecology  [facility]

## 2021-12-27 ENCOUNTER — Ambulatory Visit (INDEPENDENT_AMBULATORY_CARE_PROVIDER_SITE_OTHER): Payer: 59 | Admitting: Family Medicine

## 2021-12-27 ENCOUNTER — Encounter (INDEPENDENT_AMBULATORY_CARE_PROVIDER_SITE_OTHER): Payer: Self-pay | Admitting: Family Medicine

## 2021-12-27 VITALS — BP 117/80 | HR 74 | Temp 98.0°F | Ht 66.0 in | Wt 252.0 lb

## 2021-12-27 DIAGNOSIS — R0602 Shortness of breath: Secondary | ICD-10-CM

## 2021-12-27 DIAGNOSIS — G4730 Sleep apnea, unspecified: Secondary | ICD-10-CM | POA: Diagnosis not present

## 2021-12-27 DIAGNOSIS — F3289 Other specified depressive episodes: Secondary | ICD-10-CM

## 2021-12-27 DIAGNOSIS — G932 Benign intracranial hypertension: Secondary | ICD-10-CM

## 2021-12-27 DIAGNOSIS — Z6841 Body Mass Index (BMI) 40.0 and over, adult: Secondary | ICD-10-CM

## 2021-12-27 DIAGNOSIS — I10 Essential (primary) hypertension: Secondary | ICD-10-CM

## 2021-12-27 DIAGNOSIS — R5383 Other fatigue: Secondary | ICD-10-CM | POA: Diagnosis not present

## 2021-12-27 DIAGNOSIS — E669 Obesity, unspecified: Secondary | ICD-10-CM | POA: Diagnosis not present

## 2021-12-27 DIAGNOSIS — Z8542 Personal history of malignant neoplasm of other parts of uterus: Secondary | ICD-10-CM
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
## 2022-01-01 ENCOUNTER — Ambulatory Visit (INDEPENDENT_AMBULATORY_CARE_PROVIDER_SITE_OTHER): Payer: Self-pay | Admitting: Neurology

## 2022-01-01 ENCOUNTER — Encounter: Payer: Self-pay | Admitting: Neurology

## 2022-01-01 DIAGNOSIS — Z91199 Patient's noncompliance with other medical treatment and regimen due to unspecified reason: Secondary | ICD-10-CM

## 2022-01-01 NOTE — Progress Notes (Signed)
PATIENT NO SHOWED follow up in neurology

## 2022-01-02 ENCOUNTER — Encounter: Payer: Self-pay | Admitting: Neurology

## 2022-01-02 ENCOUNTER — Telehealth (INDEPENDENT_AMBULATORY_CARE_PROVIDER_SITE_OTHER): Payer: Self-pay | Admitting: Psychology

## 2022-01-02 ENCOUNTER — Encounter (INDEPENDENT_AMBULATORY_CARE_PROVIDER_SITE_OTHER): Payer: Self-pay

## 2022-01-02 ENCOUNTER — Encounter (INDEPENDENT_AMBULATORY_CARE_PROVIDER_SITE_OTHER): Payer: Self-pay | Admitting: Family Medicine

## 2022-01-02 ENCOUNTER — Telehealth (INDEPENDENT_AMBULATORY_CARE_PROVIDER_SITE_OTHER): Payer: 59 | Admitting: Psychology

## 2022-01-02 DIAGNOSIS — E8881 Metabolic syndrome: Secondary | ICD-10-CM | POA: Insufficient documentation

## 2022-01-02 LAB — IRON AND TIBC
Iron Saturation: 23 % (ref 15–55)
Iron: 85 ug/dL (ref 27–159)
Total Iron Binding Capacity: 374 ug/dL (ref 250–450)
UIBC: 289 ug/dL (ref 131–425)

## 2022-01-02 LAB — COMPREHENSIVE METABOLIC PANEL
ALT: 14 IU/L (ref 0–32)
AST: 11 IU/L (ref 0–40)
Albumin/Globulin Ratio: 1.4 (ref 1.2–2.2)
Albumin: 4.4 g/dL (ref 3.9–4.9)
Alkaline Phosphatase: 78 IU/L (ref 44–121)
BUN/Creatinine Ratio: 13 (ref 9–23)
BUN: 11 mg/dL (ref 6–24)
Bilirubin Total: 0.3 mg/dL (ref 0.0–1.2)
CO2: 18 mmol/L — ABNORMAL LOW (ref 20–29)
Calcium: 9.1 mg/dL (ref 8.7–10.2)
Chloride: 104 mmol/L (ref 96–106)
Creatinine, Ser: 0.83 mg/dL (ref 0.57–1.00)
Globulin, Total: 3.1 g/dL (ref 1.5–4.5)
Glucose: 91 mg/dL (ref 70–99)
Potassium: 4.3 mmol/L (ref 3.5–5.2)
Sodium: 139 mmol/L (ref 134–144)
Total Protein: 7.5 g/dL (ref 6.0–8.5)
eGFR: 89 mL/min/{1.73_m2} (ref >59)

## 2022-01-02 LAB — CBC
Hematocrit: 41.2 % (ref 34.0–46.6)
Hemoglobin: 13.7 g/dL (ref 11.1–15.9)
MCH: 30 pg (ref 26.6–33.0)
MCHC: 33.3 g/dL (ref 31.5–35.7)
MCV: 90 fL (ref 79–97)
Platelets: 320 10*3/uL (ref 150–450)
RBC: 4.57 x10E6/uL (ref 3.77–5.28)
RDW: 12.7 % (ref 11.7–15.4)
WBC: 6.1 10*3/uL (ref 3.4–10.8)

## 2022-01-02 LAB — FERRITIN: Ferritin: 98 ng/mL (ref 15–150)

## 2022-01-02 LAB — LIPID PANEL
Chol/HDL Ratio: 5 ratio — ABNORMAL HIGH (ref 0.0–4.4)
Cholesterol, Total: 194 mg/dL (ref 100–199)
HDL: 39 mg/dL — ABNORMAL LOW (ref >39)
LDL Chol Calc (NIH): 104 mg/dL — ABNORMAL HIGH (ref 0–99)
Triglycerides: 299 mg/dL — ABNORMAL HIGH (ref 0–149)
VLDL Cholesterol Cal: 51 mg/dL — ABNORMAL HIGH (ref 5–40)

## 2022-01-02 LAB — TSH RFX ON ABNORMAL TO FREE T4: TSH: 1.28 u[IU]/mL (ref 0.450–4.500)

## 2022-01-02 LAB — HEMOGLOBIN A1C
Est. average glucose Bld gHb Est-mCnc: 114 mg/dL
Hgb A1c MFr Bld: 5.6 % (ref 4.8–5.6)

## 2022-01-02 LAB — VITAMIN D, 25-HYDROXY, TOTAL: Vitamin D, 25-Hydroxy, Serum: 26 ng/mL — ABNORMAL LOW

## 2022-01-02 LAB — INSULIN, RANDOM: INSULIN: 22.4 u[IU]/mL (ref 2.6–24.9)

## 2022-01-02 LAB — VITAMIN B12: Vitamin B-12: 518 pg/mL (ref 232–1245)

## 2022-01-02 LAB — FOLATE: Folate: >20 ng/mL (ref >3.0)

## 2022-01-02 NOTE — Telephone Encounter (Signed)
  Office: [phone]  /  Fax: [phone]  Date of Encounter: January 02, 2022  Time of Encounter: 9:03am Duration of Encounter: ~2 minute(s) Provider: Glennie Isle, PsyD  CONTENT:  Amber Cobb presented for today's appointment via [place] Video Visit. While obtaining two pieces of identifying information, it was discovered Amber Cobb is a [place] resident. This provider explained she is only licensed in the state of [place]. Amber Cobb acknowledged understanding and was receptive to this provider informing Dr. Valetta Close (referring provider) of the circumstances. No evidence or endorsement of safety concerns. All questions/concerns addressed.   PLAN: This provider informed Dr. Valetta Close via secure e-mail about the aforementioned and recommended [email or web address] be utilized to help provide Amber Cobb referrals.

## 2022-01-03 ENCOUNTER — Telehealth: Payer: Self-pay | Admitting: Neurology

## 2022-01-03 NOTE — Telephone Encounter (Signed)
Split- [employer] no auth req spoke to [facility] ref # [id number].  Patient is scheduled at [facility] for 01/12/22 at 8 pm.  Mailed packet and sent [employer].

## 2022-01-08 ENCOUNTER — Telehealth: Payer: Self-pay | Admitting: Neurology

## 2022-01-08 ENCOUNTER — Telehealth (INDEPENDENT_AMBULATORY_CARE_PROVIDER_SITE_OTHER): Payer: 59 | Admitting: Neurology

## 2022-01-08 DIAGNOSIS — G932 Benign intracranial hypertension: Secondary | ICD-10-CM

## 2022-01-08 DIAGNOSIS — G43009 Migraine without aura, not intractable, without status migrainosus: Secondary | ICD-10-CM

## 2022-01-08 MED ORDER — TOPIRAMATE ER 200 MG PO SPRINKLE CAP24: 200.0000 mg | EXTENDED_RELEASE_CAPSULE | Freq: Every evening | ORAL | 3 refills | Status: DC

## 2022-01-08 NOTE — Telephone Encounter (Signed)
Please call and schedule her for 4 month virtual follow up with dr Jaynee Eagles thank you

## 2022-01-08 NOTE — Progress Notes (Unsigned)
[facility]    Provider:  Dr Jaynee Eagles Requesting Provider: Ollen Bowl, MD Primary Care Provider:  Ollen Bowl, MD  CC:  papilledema  Virtual Visit via Video Note  I connected with Amber Cobb on 01/08/2022 at  2:30 PM EDT by a video enabled telemedicine application and verified that I am speaking with the correct person using two identifiers.  Location: Patient: home Provider: office   I discussed the limitations of evaluation and management by telemedicine and the availability of in person appointments. The patient expressed understanding and agreed to proceed.   Follow Up Instructions:    I discussed the assessment and treatment plan with the patient. The patient was provided an opportunity to ask questions and all were answered. The patient agreed with the plan and demonstrated an understanding of the instructions.   The patient was advised to call back or seek an in-person evaluation if the symptoms worsen or if the condition fails to improve as anticipated.  I provided 30 minutes of non-face-to-face time during this encounter.   [name], MD   01/08/2022: Opening pressure was 26 consistent with IDIOPATHIC INTRACRANIAL HYPERTENSION. Had sleep evaluation and is getting a sleep study in September. She is on Topamax '100mg'$  BID and she has been to the weight clinic and has lost 5 pounds too and the headache has eased up a bit. She has a lot more energy. Having fatigue with the topiramate IR will change to ER and can take it all at night. She is seeing [facility] on the 1st again. She will contact me with the results and I'll get the notes. She was having daily headaches, doing "much better" or any in the last 2 weeks. MRI partially empty sella.   Patient complains of symptoms per HPI as well as the following symptoms: drowsiness due to medication . Pertinent negatives and positives per HPI. All others negative   HPI:  Amber Cobb is  a 46 y.o. 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  I reviewed had her eye care and she was seen for headache, having headaches that feel like "a brain freeze" will hurt really bad for a minute then go away, she has been going on for almost a year, patient states that her right upper lid is drooping, she has a past medical history of acid reflux, anemia, migraines, anxiety, depression, hypertension.  Examination showed visual acuity OD 20/25 and OS 20/20, pupils equally round and reactive to light no APD, extraocular movements full, it did show optic nerve edema OU and a mild ptosis, headaches going on for a year, no other neurologic symptoms, worse with hard laughing or bowel movement, they ordered an MRI of the head and neck to rule out masses and tumors, no ophthalmoplegia, discussed that she may need an LP as well.     She has a low level headache every day for 1.5 years. With pressure she gets worse symptoms of headaches, low level headache every day, mostly in the forehead, she snores heavily, headaches every day, she has a film over her eyes, she cannot lose weight, she has migraines those are different and not often, she started having migraines about 4 years ago and she was waking up with them when she gets a migraine pulsating, pounding, light and sound sensitivity and nausea but the daily headaches are different. She feels pressure in ears, hears  swishing in the ears. She has gained weight. No energy. She naps every day, 3x a day, daily headaches are mild to moderate, migraines can be severe 1-2x a week, ibuprofen. Ibuprofen helps. Migraines for 4 years. She is not interested in this time with a triptan, ibuprofen helps, discussed rebound headache with ibuprofen.No other focal neurologic deficits, associated symptoms, inciting events or modifiable factors.  Reviewed notes, labs and imaging from  outside physicians, which showed:  CT angio of the neck: Showed normal vasculature of the neck, the right vertebral artery is diminutive and nondominant, normal variant, likely accounting for the findings on the prior MRI.  MRI of the neck soft tissue: Showed abnormal appearance of the right vertebral artery, neck CT was recommended as above and was unremarkable.  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 01/2021: tsh,cbc,cmp normal  Review of Systems: Patient complains of symptoms per HPI as well as the following symptoms headaches. Pertinent negatives and positives per HPI. All others negative.   Social History   Socioeconomic History   Marital status: Married    Spouse name: Amber Cobb   Number of children: 2   Years of education: Not on file   Highest education level: Associate degree: occupational, Hotel manager, or vocational program  Occupational History   Not on file  Tobacco Use   Smoking status: Never   Smokeless tobacco: Never  Vaping Use   Vaping Use: Never used  Substance and Sexual Activity   Alcohol use: Not Currently   Drug use: Never   Sexual activity: Yes  Other Topics Concern   Not on file  Social History Narrative   Lives at home with husband and children   R handed   Caffeine: none   Social Determinants of Radio broadcast assistant Strain: Not on file  Food Insecurity: Not on file  Transportation Needs: Not on file  Physical Activity: Not on file  Stress: Not on file  Social Connections: Not on file  Intimate Partner Violence: Not on file    Family History  Problem Relation Age of Onset   Obesity Mother    Anxiety disorder Mother    Depression Mother    Hypertension Mother    Diabetes Mother    COPD Mother    Thyroid disease Mother    Migraines Mother    High Cholesterol Mother    Sleep apnea Mother     Obesity Father    Thyroid disease Sister    Migraines Sister    Thyroid disease Brother    Endometrial cancer Maternal Aunt    Lung cancer Maternal Uncle    Throat cancer Maternal Grandfather    Colon cancer Neg Hx    Esophageal cancer Neg Hx    Pancreatic cancer Neg Hx    Colon polyps Neg Hx     Past Medical History:  Diagnosis Date   Anemia    Anxiety    Back pain    Carpal tunnel syndrome    Complex atypical endometrial hyperplasia    Depression    Diverticulosis    Edema of both lower extremities    Endometrial cancer ([place])    Gallbladder disorder    Gallstones    Generalized anxiety disorder    GERD (gastroesophageal reflux disease)    History of kidney stones    Hypertension    Joint pain    Migraines  Obesity    UTI (urinary tract infection)     Patient Active Problem List   Diagnosis Date Noted   Metabolic syndrome 56/31/4970   Excessive daytime sleepiness 12/19/2021   Chronic fatigue 12/19/2021   Snoring 12/19/2021   IIH (idiopathic intracranial hypertension) 11/06/2021   Chronic daily headache 11/06/2021   Endometrial cancer ([place]) 06/22/2019   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    Past Surgical History:  Procedure Laterality Date   CARPAL TUNNEL RELEASE Bilateral    CHOLECYSTECTOMY     ENDOMETRIAL BIOPSY  05/05/2019   ROBOTIC ASSISTED TOTAL HYSTERECTOMY WITH BILATERAL SALPINGO OOPHERECTOMY N/A 06/17/2019   Procedure: XI ROBOTIC ASSISTED TOTAL HYSTERECTOMY WITH BILATERAL SALPINGECTOMY AND  LYMPH NODE DISSECTION;  Surgeon: Lafonda Mosses, MD;  Location: [facility];  Service: Gynecology;  Laterality: N/A;   SENTINEL NODE BIOPSY N/A 06/17/2019   Procedure: SENTINEL NODE BIOPSY;  Surgeon: Lafonda Mosses, MD;  Location: [facility];  Service: Gynecology;  Laterality: N/A;    Current Outpatient Medications  Medication Sig Dispense Refill   topiramate ER (QUDEXY XR) 200 MG CS24 sprinkle  capsule Take 1 capsule (200 mg total) by mouth at bedtime. 90 capsule 3   amLODipine (NORVASC) 5 MG tablet Take 5 mg by mouth daily.     butalbital-acetaminophen-caffeine (FIORICET) 50-325-40 MG tablet Take by mouth 2 (two) times daily as needed for headache.     escitalopram (LEXAPRO) 10 MG tablet Take 10 mg by mouth daily.     hydrocortisone (ANUSOL-HC) 25 MG suppository Place 1 suppository (25 mg total) rectally every 12 (twelve) hours. 12 suppository 1   loratadine (CLARITIN) 10 MG tablet Take 10 mg by mouth daily.     losartan (COZAAR) 100 MG tablet Take 100 mg by mouth daily.     Melatonin 5 MG CAPS Take 1 capsule by mouth at bedtime.     modafinil (PROVIGIL) 200 MG tablet Take 1 tablet (200 mg total) by mouth daily. 30 tablet 2   Multiple Vitamin (MULTIVITAMIN) capsule Take 1 capsule by mouth daily.     omeprazole (PRILOSEC) 40 MG capsule Take 40 mg by mouth daily as needed.     No current facility-administered medications for this visit.    Allergies as of 01/08/2022 - Review Complete 12/27/2021  Allergen Reaction Noted   Chloraprep one step [chlorhexidine gluconate] Hives 07/08/2019    Vitals: There were no vitals taken for this visit. Last Weight:  Wt Readings from Last 1 Encounters:  12/27/21 252 lb (114.3 kg)   Last Height:   Ht Readings from Last 1 Encounters:  12/27/21 '5\' 6"'$  (1.676 m)     Physical exam: Exam: Gen: NAD, conversant      CV:  Denies palpitations or chest pain or SOB. VS: Breathing at a normal rate. Weight appears obese. Not febrile. Eyes: Conjunctivae clear without exudates or hemorrhage  Neuro: Detailed Neurologic Exam  Speech:    Speech is normal; fluent and spontaneous with normal comprehension.  Cognition:    The patient is oriented to person, place, and time;     recent and remote memory intact;     language fluent;     normal attention, concentration,     fund of knowledge Cranial Nerves:    The pupils are equal, round, and reactive  to light. Cannot perform fundoscopic exam. Visual fields are full to finger confrontation. Extraocular movements are intact.  The face is symmetric with normal sensation. The palate elevates in the midline.  Hearing intact. Voice is normal. Shoulder shrug is normal. The tongue has normal motion without fasciculations.   Coordination:    Normal finger to nose  Gait:    Normal native gait  Motor Observation:   no involuntary movements noted. Tone:    Appears normal  Posture:    Posture is normal. normal erect    Strength:    Strength is anti-gravity and symmetric in the upper and lower limbs.      Sensation: intact to LT          Assessment/Plan:  Patient with chronic daily headaches, optic nerve head edema and migraines. MRI brain/CTA/CTV head unremarkable(MRI partially empty sella. ), opening pressure 26, doing much better, intolerance to Topiramate IR will change to ER.   -Opening pressure was 26 consistent with IDIOPATHIC INTRACRANIAL HYPERTENSION.  - Had sleep evaluation and is getting a sleep study in September.  - She is on Topamax '100mg'$  BID and she has been to the weight clinic and has lost 5 pounds too and the headache has eased up a bit. She has a lot more energy. Having fatigue with the topiramate IR will change to ER and can take it all at night.  - She is seeing [facility] on [facility]st again. She will contact me with the results and I'll get the notes. She was having daily headaches, doing "much better" or any in the last 2 weeks.  - getting sleep test and repeat eye exam 9/1 -Weight loss is critical, discussed weight loss and risk of permanent vision loss, seeing healthy weight and wellness - CTV for venous thrombosis and start Topamax or Diamox afterwards: No evidence of intracranial venous thrombosis,  No convincing dural venous sinus stenosis. - She snores heavily, wakes with headaches, optic nerve head edema,  She naps every day, 3x a day,: sleep test 9/1   For  any acute change especially worsening headache or vision loss call 911 and proceed to ED  To prevent or relieve headaches, try the following: Cool Compress. Lie down and place a cool compress on your head.  Avoid headache triggers. If certain foods or odors seem to have triggered your migraines in the past, avoid them. A headache diary might help you identify triggers.  Include physical activity in your daily routine. Try a daily walk or other moderate aerobic exercise.  Manage stress. Find healthy ways to cope with the stressors, such as delegating tasks on your to-do list.  Practice relaxation techniques. Try deep breathing, yoga, massage and visualization.  Eat regularly. Eating regularly scheduled meals and maintaining a healthy diet might help prevent headaches. Also, drink plenty of fluids.  Follow a regular sleep schedule. Sleep deprivation might contribute to headaches Consider biofeedback. With this mind-body technique, you learn to control certain bodily functions -- such as muscle tension, heart rate and blood pressure -- to prevent headaches or reduce headache pain.    Proceed to emergency room if you experience new or worsening symptoms or symptoms do not resolve, if you have new neurologic symptoms or if headache is severe, or for any concerning symptom.   Provided education and documentation from American headache Society toolbox including articles on: pseudotumoer cerebri(IIH), chronic migraine medication overuse headache, chronic migraines, prevention of migraines and other headaches, behavioral and other nonpharmacologic treatments for headache.  Meds ordered this encounter  Medications   topiramate ER (QUDEXY XR) 200 MG CS24 sprinkle capsule    Sig: Take 1 capsule (200 mg total) by mouth  at bedtime.    Dispense:  90 capsule    Refill:  3    Cannot tolerate IR drowsiness. Can be generic     Cc: Pallone, Jarvis Newcomer, MD,  Amber Bowl, MD  Sarina Ill, MD  [facility] [address] [place], [place] [address]  Phone [phone] Fax [phone]

## 2022-01-08 NOTE — Progress Notes (Unsigned)
Chief Complaint:   OBESITY Amber Cobb (MR# [id number]) is a 46 y.o. female who presents for evaluation and treatment of obesity and related comorbidities. Current BMI is Body mass index is 40.67 kg/m. Amber Cobb has been struggling with her weight for many years and has been unsuccessful in either losing weight, maintaining weight loss, or reaching her healthy weight goal.  Amber Cobb would like > 100 pound weight loss.  Gained weight with working from home, up 20 pounds and from  depression.   Husband overweight.  Overweight as a child.  Does not like to cook, and notes cravings for sweets and convenience food.  Skips meals, and snacks multiple times a day.  Has a family history of obesity.  Lost weight in high school and gained weight with marriage and having a baby.  Amber Cobb is currently in the action stage of change and ready to dedicate time achieving and maintaining a healthier weight. Amber Cobb is interested in becoming our patient and working on intensive lifestyle modifications including (but not limited to) diet and exercise for weight loss.  Amber Cobb's habits were reviewed today and are as follows: Her family eats meals together, she thinks her family will eat healthier with her, her desired weight loss is 107 lbs, she has been heavy most of her life, she started gaining weight in 2021, her heaviest weight ever was 260 pounds, she has significant food cravings issues, she skips meals frequently, she is frequently drinking liquids with calories, she frequently makes poor food choices, she frequently eats larger portions than normal, and she struggles with emotional eating.  Depression Screen Amber Cobb's Food and Mood (modified PHQ-9) score was 21.     12/27/2021    8:54 AM  Depression screen PHQ 2/9  Decreased Interest 3  Down, Depressed, Hopeless 3  PHQ - 2 Score 6  Altered sleeping 3  Tired, decreased energy 3  Change in appetite 3  Feeling bad or failure about yourself  3   Trouble concentrating 2  Moving slowly or fidgety/restless 1  Suicidal thoughts 0  PHQ-9 Score 21  Difficult doing work/chores Very difficult   Subjective:   1. Other fatigue Amber Cobb admits to daytime somnolence and admits to waking up still tired. Patient has a history of symptoms of daytime fatigue, morning fatigue, and morning headache. Amber Cobb generally gets 7 or 8 hours of sleep per night, and states that she has nightime awakenings. Snoring is present. Apneic episodes are not present. Epworth Sleepiness Score is 17. EKG: sinus rhythm brady at 57 BPM.    2. SOB (shortness of breath) Amber Cobb notes increasing shortness of breath with exercising and seems to be worsening over time with weight gain. She notes getting out of breath sooner with activity than she used to. This has not gotten worse recently. Amber Cobb denies shortness of breath at rest or orthopnea.  3. Sleep disorder breathing Amber Cobb Epworth score is 17.  She complains of fatigue, and she is on modafinil. She snores heavily.   4. IIH (idiopathic intracranial hypertension) Amber Cobb is on Topamax 100 mg twice daily.  She notes dysgeusia to carbonated drinks.  Headaches daily.  Managed by Neurology, and sees Ophthalmology.  5. History of endometrial cancer Amber Cobb is status post TAH in 2021 for treatment.  Ovaries are in place.  6. Benign essential HTN Amber Cobb's blood pressure is well controlled on amlodipine 5 mg daily and losartan 100 mg daily.  She denies chest pain.  7. Other depression, with emotional eating Amber Cobb's  PHQ-9 score is 21.  She is on Lexapro 10 mg daily.  Depression has been worse.  Managed by PCP.  Mood causes meal skipping.  Assessment/Plan:   1. Other fatigue Amber Cobb does feel that her weight is causing her energy to be lower than it should be. Fatigue may be related to obesity, depression or many other causes. Labs will be ordered, and in the meanwhile, Amber Cobb will focus on self care  including making healthy food choices, increasing physical activity and focusing on stress reduction.  - EKG 12-Lead - Comprehensive metabolic panel - Lipid panel - Vitamin B12 - Vitamin D, 25-Hydroxy, Total - CBC - TSH Rfx on Abnormal to Free T4 - Hemoglobin A1c - Insulin, random - Folate - Ferritin - Iron and TIBC  2. SOB (shortness of breath) Amber Cobb does feel that she gets out of breath more easily that she used to when she exercises. Amber Cobb's shortness of breath appears to be obesity related and exercise induced. She has agreed to work on weight loss and gradually increase exercise to treat her exercise induced shortness of breath. Will continue to monitor closely.  3. Sleep disorder breathing Sleep study was ordered for the patient by Dr. Brett Fairy.   4. IIH (idiopathic intracranial hypertension) Amber Cobb will continue Topamax 100 mg twice daily per Neurology.  5. History of endometrial cancer Amber Cobb will continue to follow-up with her GYN/oncology  6. Benign essential HTN Amber Cobb will continue her current blood pressure medications per her PCP.  7. Other depression, with emotional eating Amber Cobb will consider additional treatments, and add cognitive behavioral therapy with Dr. Mallie Mussel our bariatric psychologist.  8. Obesity, current BMI 40.7 Amber Cobb is currently in the action stage of change and her goal is to continue with weight loss efforts. I recommend Amber Cobb begin the structured treatment plan as follows:  She has agreed to the Category 2 Plan.  Exercise goals: As is.    Behavioral modification strategies: increasing lean protein intake, increasing water intake, decreasing eating out, no skipping meals, keeping healthy foods in the home, better snacking choices, dealing with family or coworker sabotage, and decreasing junk food.  She was informed of the importance of frequent follow-up visits to maximize her success with intensive lifestyle modifications  for her multiple health conditions. She was informed we would discuss her lab results at her next visit unless there is a critical issue that needs to be addressed sooner. Amber Cobb agreed to keep her next visit at the agreed upon time to discuss these results.  Objective:   Blood pressure 117/80, pulse 74, temperature 98 F (36.7 C), height '5\' 6"'$  (1.676 m), weight 252 lb (114.3 kg), SpO2 99 %. Body mass index is 40.67 kg/m.  EKG: Normal sinus rhythm, rate 57 BPM.  Indirect Calorimeter completed today shows a VO2 of (unable to obtain) and a REE of 1757.  Her calculated basal metabolic rate is 1779 thus her basal metabolic rate is worse than expected.  General: Cooperative, alert, well developed, in no acute distress. HEENT: Conjunctivae and lids unremarkable. Cardiovascular: Regular rhythm.  Lungs: Normal work of breathing. Neurologic: No focal deficits.   Lab Results  Component Value Date   CREATININE 0.83 12/27/2021   BUN 11 12/27/2021   NA 139 12/27/2021   K 4.3 12/27/2021   CL 104 12/27/2021   CO2 18 (L) 12/27/2021   Lab Results  Component Value Date   ALT 14 12/27/2021   AST 11 12/27/2021   ALKPHOS 78 12/27/2021   BILITOT 0.3  12/27/2021   Lab Results  Component Value Date   HGBA1C 5.6 12/27/2021   Lab Results  Component Value Date   INSULIN 22.4 12/27/2021   Lab Results  Component Value Date   TSH 1.280 12/27/2021   Lab Results  Component Value Date   CHOL 194 12/27/2021   HDL 39 (L) 12/27/2021   LDLCALC 104 (H) 12/27/2021   TRIG 299 (H) 12/27/2021   CHOLHDL 5.0 (H) 12/27/2021   Lab Results  Component Value Date   WBC 6.1 12/27/2021   HGB 13.7 12/27/2021   HCT 41.2 12/27/2021   MCV 90 12/27/2021   PLT 320 12/27/2021   Lab Results  Component Value Date   IRON 85 12/27/2021   TIBC 374 12/27/2021   FERRITIN 98 12/27/2021   Attestation Statements:   Reviewed by clinician on day of visit: allergies, medications, problem list, medical history,  surgical history, family history, social history, and previous encounter notes.   Wilhemena Durie, am acting as transcriptionist for Loyal Gambler, DO.  I have reviewed the above documentation for accuracy and completeness, and I agree with the above. Dell Ponto, DO

## 2022-01-09 ENCOUNTER — Encounter: Payer: Self-pay | Admitting: Neurology

## 2022-01-10 ENCOUNTER — Ambulatory Visit (INDEPENDENT_AMBULATORY_CARE_PROVIDER_SITE_OTHER): Payer: 59 | Admitting: Family Medicine

## 2022-01-10 ENCOUNTER — Encounter (INDEPENDENT_AMBULATORY_CARE_PROVIDER_SITE_OTHER): Payer: Self-pay | Admitting: Family Medicine

## 2022-01-10 VITALS — BP 119/80 | HR 64 | Temp 97.4°F | Ht 66.0 in | Wt 244.0 lb

## 2022-01-10 DIAGNOSIS — E559 Vitamin D deficiency, unspecified: Secondary | ICD-10-CM

## 2022-01-10 DIAGNOSIS — E8881 Metabolic syndrome: Secondary | ICD-10-CM | POA: Diagnosis not present

## 2022-01-10 DIAGNOSIS — Z6839 Body mass index (BMI) 39.0-39.9, adult: Secondary | ICD-10-CM

## 2022-01-10 DIAGNOSIS — F3289 Other specified depressive episodes: Secondary | ICD-10-CM

## 2022-01-10 DIAGNOSIS — G4730 Sleep apnea, unspecified: Secondary | ICD-10-CM | POA: Diagnosis not present

## 2022-01-10 DIAGNOSIS — F32A Depression, unspecified: Secondary | ICD-10-CM | POA: Insufficient documentation

## 2022-01-10 DIAGNOSIS — E669 Obesity, unspecified: Secondary | ICD-10-CM

## 2022-01-10 MED ORDER — VITAMIN D (ERGOCALCIFEROL) 1.25 MG (50000 UNIT) PO CAPS: 50000.0000 [IU] | ORAL_CAPSULE | ORAL | 0 refills | Status: DC

## 2022-01-10 MED ORDER — BUPROPION HCL ER (SR) 150 MG PO TB12: 150.0000 mg | ORAL_TABLET | Freq: Two times a day (BID) | ORAL | 0 refills | Status: DC

## 2022-01-12 ENCOUNTER — Ambulatory Visit (INDEPENDENT_AMBULATORY_CARE_PROVIDER_SITE_OTHER): Payer: 59 | Admitting: Neurology

## 2022-01-12 DIAGNOSIS — G4733 Obstructive sleep apnea (adult) (pediatric): Secondary | ICD-10-CM | POA: Diagnosis not present

## 2022-01-12 DIAGNOSIS — G932 Benign intracranial hypertension: Secondary | ICD-10-CM

## 2022-01-12 DIAGNOSIS — G4719 Other hypersomnia: Secondary | ICD-10-CM

## 2022-01-12 DIAGNOSIS — R5382 Chronic fatigue, unspecified: Secondary | ICD-10-CM

## 2022-01-12 DIAGNOSIS — R519 Headache, unspecified: Secondary | ICD-10-CM

## 2022-01-12 DIAGNOSIS — R0683 Snoring: Secondary | ICD-10-CM

## 2022-01-21 NOTE — Progress Notes (Unsigned)
Chief Complaint:   OBESITY Amber Cobb is here to discuss her progress with her obesity treatment plan along with follow-up of her obesity related diagnoses. Amber Cobb is on the Category 2 Plan and states she is following her eating plan approximately 95% of the time. Amber Cobb states she is not exercising, but walks 5,000-10,000 steps daily 7 days per week. .  Today's visit was #: 2 Starting weight: 252 lbs Starting date: 12/27/2021 Today's weight: 244 lbs Today's date: 01/10/2022 Total lbs lost to date: 8 lbs Total lbs lost since last in-office visit: 8 lbs  Interim History: Getting meals in.  Denies cravings.  Wanting to use protein powder.  Did eat out while traveling last weekend.  Denies hunger.  Good support at home, has not started exercise.   Subjective:   1. Sleep-disordered breathing Polysomnogram  Friday.  Complains of fatigue.   2. Vitamin D deficiency New diagnosis. Discussed labs with patient today. Vitamin D level 26, complains of fatigue.   3. Insulin resistance Discussed labs with patient today. Fasting insulin 22.4.  Has decreased intake of sugar and starchy foods.   4. Metabolic syndrome Triglycerides >150,  HDL <49.  Abdominal circumference >35".  Consistent with metabolic syndrome.  Visceral fat rating elevated.  5. Other depression,with emotional eating PHQ-9:21, on Lexapro with out ability to see Dr Mallie Mussel, lives in [place].    Assessment/Plan:   1. Sleep-disordered breathing Follow up results from sleep study.   2. Vitamin D deficiency Begin - Vitamin D, Ergocalciferol, (DRISDOL) 1.25 MG (50000 UNIT) CAPS capsule; Take 1 capsule (50,000 Units total) by mouth every 7 (seven) days.  Dispense: 5 capsule; Refill: 0  3. Insulin resistance Recheck fasting insulin in 3 months.  Consider use of Metformin.   4. Metabolic syndrome Actively working on [employer], exercise, and weight loss.  Consider adding Metformin.   5. Other depression,with  emotional eating Begin - buPROPion (WELLBUTRIN SR) 150 MG 12 hr tablet; Take 1 tablet (150 mg total) by mouth 2 (two) times daily.  Dispense: 60 tablet; Refill: 0  6. Obesity,Current BMI 39.5 Amber Cobb is currently in the action stage of change. As such, her goal is to continue with weight loss efforts. She has agreed to the Category 2 Plan.   Exercise goals:  Plans to walk 1/2 mile 3-4 times per week.   Behavioral modification strategies: increasing lean protein intake, increasing water intake, increasing high fiber foods, decreasing eating out, no skipping meals, meal planning and cooking strategies, better snacking choices, and decreasing junk food.  Amber Cobb has agreed to follow-up with our clinic in 3 weeks. She was informed of the importance of frequent follow-up visits to maximize her success with intensive lifestyle modifications for her multiple health conditions.   Objective:   Blood pressure 119/80, pulse 64, temperature (!) 97.4 F (36.3 C), height '5\' 6"'$  (1.676 m), weight 244 lb (110.7 kg), SpO2 98 %. Body mass index is 39.38 kg/m.  General: Cooperative, alert, well developed, in no acute distress. HEENT: Conjunctivae and lids unremarkable. Cardiovascular: Regular rhythm.  Lungs: Normal work of breathing. Neurologic: No focal deficits.   Lab Results  Component Value Date   CREATININE 0.83 12/27/2021   BUN 11 12/27/2021   NA 139 12/27/2021   K 4.3 12/27/2021   CL 104 12/27/2021   CO2 18 (L) 12/27/2021   Lab Results  Component Value Date   ALT 14 12/27/2021   AST 11 12/27/2021   ALKPHOS 78 12/27/2021   BILITOT 0.3  12/27/2021   Lab Results  Component Value Date   HGBA1C 5.6 12/27/2021   Lab Results  Component Value Date   INSULIN 22.4 12/27/2021   Lab Results  Component Value Date   TSH 1.280 12/27/2021   Lab Results  Component Value Date   CHOL 194 12/27/2021   HDL 39 (L) 12/27/2021   LDLCALC 104 (H) 12/27/2021   TRIG 299 (H) 12/27/2021   CHOLHDL  5.0 (H) 12/27/2021   No results found for: "VD25OH" Lab Results  Component Value Date   WBC 6.1 12/27/2021   HGB 13.7 12/27/2021   HCT 41.2 12/27/2021   MCV 90 12/27/2021   PLT 320 12/27/2021   Lab Results  Component Value Date   IRON 85 12/27/2021   TIBC 374 12/27/2021   FERRITIN 98 12/27/2021    Attestation Statements:   Reviewed by clinician on day of visit: allergies, medications, problem list, medical history, surgical history, family history, social history, and previous encounter notes.  I, Davy Pique, am acting as Location manager for Loyal Gambler, DO.  I have reviewed the above documentation for accuracy and completeness, and I agree with the above. Dell Ponto, DO

## 2022-01-28 NOTE — Addendum Note (Signed)
Addended by: Larey Seat on: 01/28/2022 04:33 PM   Modules accepted: Orders

## 2022-01-28 NOTE — Procedures (Signed)
STUDY DATE:  01/12/2022     PATIENT NAME:     DATE OF BIRTH:  March 30, 1976  PATIENT ID:  [id number]    TYPE OF STUDY:  PSG  READING PHYSICIAN: Larey Seat, MD REFERRED BY: Sarina Ill, MD . PCP is Dr Margo Common  SCORING TECHNICIAN: Gaylyn Cheers, RPSGT   HISTORY:  Amber Cobb  is a 46 year-old Female patient with a chief complaint of Non-restorative sleep , Light sleep, Fatigue and Excessive daytime sleepiness. Further :  HTN, intracranial hypertension, Anemia, GAD, COPD, GERD, Tinnitus,  Migraines. She reports napping often, falling asleep 3 times a day. She gained weight during the pandemic, developed daily headaches.  ADDITIONAL INFORMATION:  The Epworth Sleepiness Scale was endorsed at 16 /24 points (scores above or equal to 10 are suggestive of hypersomnolence). FSS was endorsed at 60 /63 points. Height: 67 in Weight: 257 lbs (BMI 40) Neck Size: 15 "  MEDICATIONS: Norvasc, Lexapro, Claritin, Cozaar, Melatonin, Multivitamin, Prilosec, Topamax TECHNICAL DESCRIPTION: A registered sleep technologist ( RPSGT)  was in attendance for the duration of the recording.  Data collection, scoring, video monitoring, and reporting were performed in compliance with the AASM Manual for the Scoring of Sleep and Associated Events; (Hypopnea is scored based on the criteria listed in Section VIII D. 1b in the AASM Manual V2.6 using a 4% oxygen desaturation rule or Hypopnea is scored based on the criteria listed in Section VIII D. 1a in the AASM Manual V2.6 using 3% oxygen desaturation and /or arousal rule).   SLEEP CONTINUITY AND SLEEP ARCHITECTURE:  Lights-off time was at 22:06: and lights-on at  04:59:, with  6.9 hours of recording time  (414 minutes ) . Total sleep time ( TST) was 299.0, resulting  in a  sleep efficiency of 72.2%. Sleep latency was  33.5 minutes. Wake after sleep onset (WASO) time accounted for 81.5 minutes.  Total  REM sleep time was 00 minutes and REM sleep latency was void . Of the total sleep  time, the percentage of stage N1 sleep was 5.0%, stage N2 sleep was 74%, stage N3 sleep was 20.9%, and REM sleep was 0.0%. BODY POSITION:  TST was divided between the following sleep positions: supine sleep at  46 minutes (16%), non-supine sleep at  253 minutes (84%); right-sided sleep at 164 minutes (55%), left sided sleep at 02 minutes (1%), and prone sleep at 86 minutes (29%).    There were 0 Stage R periods observed on this study night, 19 awakenings (i.e. transitions to Stage W from any sleep stage), and 57 total stage transitions.    RESPIRATORY MONITORING:   Based on AASM criteria (using a 3% oxygen desaturation and /or arousal rule for scoring hypopneas), there were 25 apneas (25 obstructive; 0 central; 0 mixed), and 25 hypopneas.  The Apnea index was 5.0/h. Hypopnea index was 5.0/h. The apnea-hypopnea index (general AHI ) was 10.0/h overall . AHI was 43.9/h in supine, 0/h  non-supine; and was  0.0/h REM, all sleep recorded was NREM with an AHI of 10/h.  There were 0 respiratory effort-related arousals (RERAs) scored.  Snoring was classified as mild- moderate. OXIMETRY: Oxyhemoglobin Saturation Nadir during sleep was at  86%) from a mean of 95%.  Of the Total sleep time (TST)  hypoxemia (<89%) was present for  1.2 minutes, or 0.4% of total sleep time.  LIMB MOVEMENTS: There were 0 periodic limb movements of sleep (0.0/h), of which 0 (0.0/h) were associated with an arousal. AROUSAL: There were 26 arousals  in total, for an arousal index of 5/hour.  Of these, 3 were identified as respiratory-related arousals (1 /h), 0 were PLM-related arousals (0 /h), and 30 were non-specific arousals (6 /h).  EEG:  PSG EEG was of normal amplitude and frequency and showed symmetric manifestation of sleep stages. EKG: The electrocardiogram documented NSR.  The average heart rate during sleep was 53 bpm.  The heart rate during sleep varied narrowly between a minimum of 48 and a maximum of 68 bpm. AUDIO and VIDEO: No  vocalization, phonation or complex sleep activity with movement was captured.  IMPRESSION: 1) Sleep disordered breathing was present. Obstructive Sleep apnea was seen at a mild degree (AHI 10) without REM sleep being captured.  OSA was associated with  mild- moderate snoring.  2) Hypoxemia was not recorded at a clinically relevant level.  3) Total sleep time was reduced at 299.0 minutes.  Sleep efficiency was decreased at 72.2%.   Sleep fragmentation was moderate - The majority of sleep arousals was related to spontaneous arousals.  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 CPAP would be an appropriate approach. It is possible that the AHI here underestimates the severity of apnea as no REM sleep was captured and the patient did only sleep in supine for less than 20% of the total sleep time.   Her CPAP auto titration device would be set for 5-15 cm water, with 2 cm EPR.  Interface and heated humidification will be adjusted to patient's need and comfort.    1) Alcohol and sedating medications at bedtime can exacerbate sleep-disordered breathing. 2) Untreated obstructive sleep apnea with hypersomnolence can increase the risk of motor vehicle accidents. 3) In patients with BMI over 35 kg/m2:  Weight loss can lead to improvement in sleep-disordered breathing, especially in obesity hypoventilation.  4) Avoiding supine sleep will reduce the overall frequency of oxygen desaturation events.   Larey Seat, MD Medical Director of [facility] at [facility],  Board certified in Sleep Medicine and Neurology.

## 2022-01-29 ENCOUNTER — Telehealth: Payer: Self-pay

## 2022-01-29 DIAGNOSIS — G4733 Obstructive sleep apnea (adult) (pediatric): Secondary | ICD-10-CM

## 2022-01-29 NOTE — Telephone Encounter (Signed)
----- Message from Larey Seat, MD sent at 01/28/2022  5:03 PM EDT ----- AUDIO and VIDEO: No vocalization, phonation or complex sleep activitywith movement was captured.  IMPRESSION: 1) Sleep disordered breathing was present. Obstructive Sleep apnea was seen at a mild degree (AHI 10)without REM sleep being captured. OSA was associated with mild- moderatesnoring.  2) Hypoxemia was not recorded at a clinically relevant level.  3)Total sleep time was reducedat 299.74mnutes. Sleep efficiency was decreasedat 72.2%.Sleep fragmentation was moderate - The majority of sleep arousals was related to spontaneous arousals.  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 CPAP would be an appropriate approach. It is possible that the AHI here underestimates the severity of apnea as no REM sleep was captured and the patient did only sleep in supine for less than 20% of the total sleep time.   Her CPAP auto titration device would be set for 5-15 cm water, with 2 cm EPR. Interface and heated humidification will be adjusted to patient's need and comfort.   1) Alcohol and sedating medications at bedtime can exacerbate sleep-disordered breathing. 2) Untreated obstructive sleep apnea with hypersomnolence can increase the risk of motor vehicle accidents. 3) In patients with BMI over 35 kg/m2:  Weight loss can lead to improvement in sleep-disordered breathing, especially in obesity hypoventilation.  4) Avoiding supine sleep will reduce the overall frequency of oxygen desaturation events.   CarmenDohmeier, MD Medical Director of [facility]  Board certified in Sleep Medicine and Neurology.            Last Resulted: 01/12/22 20:00    Order Details   View Encounter   Lab and Collection Details   Routing   Result History  View All Conversations on this  Encounter    Result Care Coordination   Result Notes    CLarey Seat MD  01/28/2022 4:33 PM EDT Back to Top  IMPRESSION: 1) Sleep disordered breathing was present. Obstructive Sleep apnea was seen at a mild degree (AHI 10)without REM sleep being captured. OSA was associated with mild- moderatesnoring.  2) Hypoxemia was not recorded at a clinically relevant level.  3)Total sleep time was reducedat 299.0645mutes. Sleep efficiency was decreasedat 72.2%.Sleep fragmentation was moderate - The majority of sleep arousals was related to spontaneous arousals.  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 CPAP would be an appropriate approach. It is possible that the AHI here underestimates the severity of apnea as no REM sleep was captured and the patient did only sleep in supine for less than 20% of the total sleep time.   Her CPAP auto titration device would be set for 5-15 cm water, with 2 cm EPR. Interface and heated humidification will be adjusted to patient's need and comfort.   CaLarey SeatMD  01/28/2022 4:32 PM EDT   IMPRESSION: 1) Sleep disordered breathing was present. Obstructive Sleep apnea was seen at a mild degree (AHI 10)without REM sleep being captured. OSA was associated with mild- moderatesnoring.  2) Hypoxemia was not recorded at a clinically relevant level.  3)Total sleep time was reducedat 299.45m101mtes. Sleep efficiency was decreasedat 72.2%.Sleep fragmentation was moderate - The majority of sleep arousals was related to spontaneous arousals.  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 CPAP would be an appropriate approach. It is possible that the AHI here underestimates the severity of apnea as no REM  sleep was captured and the patient did only sleep in supine for less than 20% of the total sleep time.   Her CPAP auto titration device would be set for 5-15 cm water, with 2 cm EPR. Interface and heated humidification will be adjusted to patient's need and comfort.     Patient Communication    Append Comments  Seen Back to Top  IMPRESSION: 1) Sleep disordered breathing was present. Obstructive Sleep apnea was seen at a mild degree (AHI 10)without REM sleep being captured. OSA was associated with mild- moderatesnoring.  2) Hypoxemia was not recorded at a clinically relevant level. ... Written by Larey Seat, MD on 01/28/2022 4:33 PM EDT View Full Comments Seen by patient Yves Dill on 01/28/2022 4:49 PM     Expected  For home use only DME continuous positive airway pressure (CPAP) 01/28/22

## 2022-01-29 NOTE — Telephone Encounter (Signed)
I called pt. I advised pt that Dr. Brett Fairy reviewed their sleep study results and found that pt has mild osa. Dr. Brett Fairy recommends that pt start an auto-pap at home given her [place]. I reviewed PAP compliance expectations with the pt. Pt is agreeable to starting an auto-PAP. I advised pt that an order will be sent to a DME, [facility], and [facility] will call the pt within about one week after they file with the pt's insurance. [facility] will show the pt how to use the machine, fit for masks, and troubleshoot the auto-PAP if needed. A follow up appt was made for insurance purposes with Janett Billow, NP on 04/30/22 at 12:45pm. Pt verbalized understanding to arrive 15 minutes early and bring their auto-PAP.  I also discussed with patient Dr. Edwena Felty recommendation to avoid alcohol and sedating medications at bedtime.  I advised her that untreated sleep apnea with hypersomnolence can increase the risk of motor vehicle accidents.  I advised her that weight loss can lead to improvement in sleep disordered breathing.  I advised her to avoid supine sleep.    A letter with all of this information in it will be mailed to the pt as a reminder. I verified with the pt that the address we have on file is correct. Pt verbalized understanding of results. Pt had no questions at this time but was encouraged to call back if questions arise. I have sent the order to [facility] and have received confirmation that they have received the order.

## 2022-02-07 ENCOUNTER — Ambulatory Visit (INDEPENDENT_AMBULATORY_CARE_PROVIDER_SITE_OTHER): Payer: 59 | Admitting: Family Medicine

## 2022-02-07 ENCOUNTER — Encounter (INDEPENDENT_AMBULATORY_CARE_PROVIDER_SITE_OTHER): Payer: Self-pay | Admitting: Family Medicine

## 2022-02-07 VITALS — BP 113/77 | HR 53 | Temp 98.4°F | Ht 66.0 in | Wt 237.0 lb

## 2022-02-07 DIAGNOSIS — G4733 Obstructive sleep apnea (adult) (pediatric): Secondary | ICD-10-CM | POA: Diagnosis not present

## 2022-02-07 DIAGNOSIS — Z6838 Body mass index (BMI) 38.0-38.9, adult: Secondary | ICD-10-CM

## 2022-02-07 DIAGNOSIS — I10 Essential (primary) hypertension: Secondary | ICD-10-CM | POA: Diagnosis not present

## 2022-02-07 DIAGNOSIS — E559 Vitamin D deficiency, unspecified: Secondary | ICD-10-CM

## 2022-02-07 DIAGNOSIS — Z6841 Body Mass Index (BMI) 40.0 and over, adult: Secondary | ICD-10-CM

## 2022-02-07 DIAGNOSIS — I619 Nontraumatic intracerebral hemorrhage, unspecified: Secondary | ICD-10-CM

## 2022-02-07 DIAGNOSIS — G932 Benign intracranial hypertension: Secondary | ICD-10-CM | POA: Diagnosis not present

## 2022-02-07 DIAGNOSIS — E8881 Metabolic syndrome: Secondary | ICD-10-CM

## 2022-02-07 DIAGNOSIS — F3289 Other specified depressive episodes: Secondary | ICD-10-CM

## 2022-02-07 DIAGNOSIS — E669 Obesity, unspecified: Secondary | ICD-10-CM

## 2022-02-07 MED ORDER — BUPROPION HCL ER (SR) 150 MG PO TB12: 150.0000 mg | ORAL_TABLET | Freq: Two times a day (BID) | ORAL | 0 refills | Status: DC

## 2022-02-07 MED ORDER — VITAMIN D (ERGOCALCIFEROL) 1.25 MG (50000 UNIT) PO CAPS: 50000.0000 [IU] | ORAL_CAPSULE | ORAL | 0 refills | Status: DC

## 2022-02-08 DIAGNOSIS — I619 Nontraumatic intracerebral hemorrhage, unspecified: Secondary | ICD-10-CM | POA: Insufficient documentation

## 2022-02-08 DIAGNOSIS — G4733 Obstructive sleep apnea (adult) (pediatric): Secondary | ICD-10-CM | POA: Insufficient documentation

## 2022-02-08 DIAGNOSIS — I10 Essential (primary) hypertension: Secondary | ICD-10-CM | POA: Insufficient documentation

## 2022-02-12 NOTE — Progress Notes (Unsigned)
Chief Complaint:   OBESITY Amber Cobb is here to discuss her progress with her obesity treatment plan along with follow-up of her obesity related diagnoses. Amber Cobb is on the Category 2 Plan and states she is following her eating plan approximately 90-95% of the time. Amber Cobb states she is not exercising.   Today's visit was #: 3 Starting weight: 252 lbs Starting date: 12/27/2021 Today's weight: 237 lbs Today's date: 02/07/2022 Total lbs lost to date: 15 lbs Total lbs lost since last in-office visit: 7 lbs  Interim History: Doing well on meal plan.  She has incorporated protein shakes and carries her lunch to work.  She has been practicing mindful eating with higher stress.  She denies hunger or cravings.   Subjective:   1. Essential hypertension Blood pressure is well controlled on amlodipine 5 mg and losartan 100 mg daily.    2. OSA (obstructive sleep apnea) Amber Cobb's test was positive for mild OSA.  Starting CPAP 02/16/2022.  3. Metabolic syndrome She has lost 15 lbs in 2 months with dietary changes.   4. Nontraumatic intracerebral hemorrhage, unspecified cerebral location, unspecified laterality Amber Cobb) She has been seeing Neurology and having some headaches.  She is on Topiramate 200 mg XR at night.   5. Other depression,with emotional eating She is on Wellbutrin SR 150 mg BID.  It helps with stress eating and denies any side effects.    Assessment/Plan:   1. Essential hypertension Continue current blood pressure medications.   2. OSA (obstructive sleep apnea) Follow up with results after starting CPAP.    3. Metabolic syndrome Plan to increase exercise, recheck fasting lipid panel in 4 months.   4. Nontraumatic intracerebral hemorrhage, unspecified cerebral location, unspecified laterality (Amber Cobb) Continue active plan for weight loss.   5. Other depression,with emotional eating Refill - buPROPion (WELLBUTRIN SR) 150 MG 12 hr tablet; Take 1 tablet (150 mg  total) by mouth 2 (two) times daily.  Dispense: 60 tablet; Refill: 0  6. Obesity,current BMI 38.4 We discussed exercise options with upcoming season changes.   Refill - Vitamin D, Ergocalciferol, (DRISDOL) 1.25 MG (50000 UNIT) CAPS capsule; Take 1 capsule (50,000 Units total) by mouth every 7 (seven) days.  Dispense: 5 capsule; Refill: 0  Amber Cobb is currently in the action stage of change. As such, her goal is to continue with weight loss efforts. She has agreed to the Category 2 Plan.   Exercise goals:  Discussed plan.   Behavioral modification strategies: increasing lean protein intake, increasing vegetables, increasing water intake, decreasing eating out, no skipping meals, keeping healthy foods in the home, better snacking choices, and decreasing junk food.  Amber Cobb has agreed to follow-up with our clinic in 3-4 weeks. She was informed of the importance of frequent follow-up visits to maximize her success with intensive lifestyle modifications for her multiple Cobb conditions.   Objective:   Blood pressure 113/77, pulse (!) 53, temperature 98.4 F (36.9 C), height '5\' 6"'$  (1.676 m), weight 237 lb (107.5 kg), SpO2 99 %. Body mass index is 38.25 kg/m.  General: Cooperative, alert, well developed, in no acute distress. HEENT: Conjunctivae and lids unremarkable. Cardiovascular: Regular rhythm.  Lungs: Normal work of breathing. Neurologic: No focal deficits.   Lab Results  Component Value Date   CREATININE 0.83 12/27/2021   BUN 11 12/27/2021   NA 139 12/27/2021   K 4.3 12/27/2021   CL 104 12/27/2021   CO2 18 (L) 12/27/2021   Lab Results  Component Value Date  ALT 14 12/27/2021   AST 11 12/27/2021   ALKPHOS 78 12/27/2021   BILITOT 0.3 12/27/2021   Lab Results  Component Value Date   HGBA1C 5.6 12/27/2021   Lab Results  Component Value Date   INSULIN 22.4 12/27/2021   Lab Results  Component Value Date   TSH 1.280 12/27/2021   Lab Results  Component Value Date    CHOL 194 12/27/2021   HDL 39 (L) 12/27/2021   LDLCALC 104 (H) 12/27/2021   TRIG 299 (H) 12/27/2021   CHOLHDL 5.0 (H) 12/27/2021   No results found for: "VD25OH" Lab Results  Component Value Date   WBC 6.1 12/27/2021   HGB 13.7 12/27/2021   HCT 41.2 12/27/2021   MCV 90 12/27/2021   PLT 320 12/27/2021   Lab Results  Component Value Date   IRON 85 12/27/2021   TIBC 374 12/27/2021   FERRITIN 98 12/27/2021    Attestation Statements:   Reviewed by clinician on day of visit: allergies, medications, problem list, medical history, surgical history, family history, social history, and previous encounter notes.  I, Davy Pique, am acting as Location manager for Loyal Gambler, DO.  I have reviewed the above documentation for accuracy and completeness, and I agree with the above. -  ***

## 2022-03-13 ENCOUNTER — Ambulatory Visit (INDEPENDENT_AMBULATORY_CARE_PROVIDER_SITE_OTHER): Payer: 59 | Admitting: Family Medicine

## 2022-03-13 ENCOUNTER — Encounter (INDEPENDENT_AMBULATORY_CARE_PROVIDER_SITE_OTHER): Payer: Self-pay | Admitting: Family Medicine

## 2022-03-13 VITALS — BP 127/88 | HR 55 | Temp 97.9°F | Ht 66.0 in | Wt 233.0 lb

## 2022-03-13 DIAGNOSIS — F3289 Other specified depressive episodes: Secondary | ICD-10-CM

## 2022-03-13 DIAGNOSIS — E669 Obesity, unspecified: Secondary | ICD-10-CM

## 2022-03-13 DIAGNOSIS — E559 Vitamin D deficiency, unspecified: Secondary | ICD-10-CM | POA: Diagnosis not present

## 2022-03-13 DIAGNOSIS — E66813 Obesity, class 3: Secondary | ICD-10-CM

## 2022-03-13 DIAGNOSIS — E88819 Insulin resistance, unspecified: Secondary | ICD-10-CM | POA: Diagnosis not present

## 2022-03-13 DIAGNOSIS — Z6837 Body mass index (BMI) 37.0-37.9, adult: Secondary | ICD-10-CM

## 2022-03-13 MED ORDER — VITAMIN D (ERGOCALCIFEROL) 1.25 MG (50000 UNIT) PO CAPS: 50000.0000 [IU] | ORAL_CAPSULE | ORAL | 0 refills | Status: DC

## 2022-03-13 MED ORDER — BUPROPION HCL ER (SR) 150 MG PO TB12: 150.0000 mg | ORAL_TABLET | Freq: Two times a day (BID) | ORAL | 0 refills | Status: DC

## 2022-03-27 NOTE — Progress Notes (Signed)
Chief Complaint:   OBESITY Amber Cobb is here to discuss her progress with her obesity treatment plan along with follow-up of her obesity related diagnoses. Amber Cobb is on the Category 2 Plan and states she is following her eating plan approximately 80% of the time. Amber Cobb states she is walking 20-30 minutes 2-3 times per week.  Today's visit was #: 4 Starting weight: 252 lbs Starting date: 12/27/2021 Today's weight: 233 lbs Today's date: 03/13/2022 Total lbs lost to date: 19 lbs Total lbs lost since last in-office visit: 4 lbs  Interim History: She is doing well with the meal plan.  She has good support at home.  She is getting in fruit and veggies.  She is trying to drink more water.  She is using a smart watch to track steps.   Subjective:   1. Vitamin D deficiency She is currently taking prescription vitamin D 50,000 IU each week. She denies nausea, vomiting or muscle weakness. Last Vitamin D 50,000 IU weekly.   2. Insulin resistance Last fasting insulin 22.4.  Actively working on reducing sugar and starches and increasing walking.   3. Other depression,with emotional eating She is on Buproprion SR 150 mg BID.  Ran out 2 days ago with increased sugar cravings.    Assessment/Plan:   1. Vitamin D deficiency Recheck level in 2 months.   Refill - Vitamin D, Ergocalciferol, (DRISDOL) 1.25 MG (50000 UNIT) CAPS capsule; Take 1 capsule (50,000 Units total) by mouth every 7 (seven) days.  Dispense: 12 capsule; Refill: 0  2. Insulin resistance Consider use of metformin continue healthy lifestyle changes.  3. Other depression,with emotional eating Refill - buPROPion (WELLBUTRIN SR) 150 MG 12 hr tablet; Take 1 tablet (150 mg total) by mouth 2 (two) times daily.  Dispense: 180 tablet; Refill: 0  4. Obesity,current BMI 37.7 1) Allow cheat once a week, has started to do this and has helped.   2) Reviewed progression of Bioimpedance.   Amber Cobb is currently in the action stage  of change. As such, her goal is to continue with weight loss efforts. She has agreed to the Category 2 Plan.   Exercise goals:  Start using resistance bands 2-3 times per week.   Behavioral modification strategies: increasing lean protein intake, increasing vegetables, increasing water intake, decreasing eating out, no skipping meals, meal planning and cooking strategies, better snacking choices, and decreasing junk food.  Amber Cobb has agreed to follow-up with our clinic in 3-4 weeks. She was informed of the importance of frequent follow-up visits to maximize her success with intensive lifestyle modifications for her multiple health conditions.   Objective:   Blood pressure 127/88, pulse (!) 55, temperature 97.9 F (36.6 C), height '5\' 6"'$  (1.676 m), weight 233 lb (105.7 kg), SpO2 100 %. Body mass index is 37.61 kg/m.  General: Cooperative, alert, well developed, in no acute distress. HEENT: Conjunctivae and lids unremarkable. Cardiovascular: Regular rhythm.  Lungs: Normal work of breathing. Neurologic: No focal deficits.   Lab Results  Component Value Date   CREATININE 0.83 12/27/2021   BUN 11 12/27/2021   NA 139 12/27/2021   K 4.3 12/27/2021   CL 104 12/27/2021   CO2 18 (L) 12/27/2021   Lab Results  Component Value Date   ALT 14 12/27/2021   AST 11 12/27/2021   ALKPHOS 78 12/27/2021   BILITOT 0.3 12/27/2021   Lab Results  Component Value Date   HGBA1C 5.6 12/27/2021   Lab Results  Component Value Date  INSULIN 22.4 12/27/2021   Lab Results  Component Value Date   TSH 1.280 12/27/2021   Lab Results  Component Value Date   CHOL 194 12/27/2021   HDL 39 (L) 12/27/2021   LDLCALC 104 (H) 12/27/2021   TRIG 299 (H) 12/27/2021   CHOLHDL 5.0 (H) 12/27/2021   No results found for: "VD25OH" Lab Results  Component Value Date   WBC 6.1 12/27/2021   HGB 13.7 12/27/2021   HCT 41.2 12/27/2021   MCV 90 12/27/2021   PLT 320 12/27/2021   Lab Results  Component Value  Date   IRON 85 12/27/2021   TIBC 374 12/27/2021   FERRITIN 98 12/27/2021    Attestation Statements:   Reviewed by clinician on day of visit: allergies, medications, problem list, medical history, surgical history, family history, social history, and previous encounter notes.  I, Davy Pique, am acting as Location manager for Loyal Gambler, DO.  I have reviewed the above documentation for accuracy and completeness, and I agree with the above. Dell Ponto, DO

## 2022-04-16 ENCOUNTER — Encounter (INDEPENDENT_AMBULATORY_CARE_PROVIDER_SITE_OTHER): Payer: Self-pay | Admitting: Family Medicine

## 2022-04-16 ENCOUNTER — Ambulatory Visit (INDEPENDENT_AMBULATORY_CARE_PROVIDER_SITE_OTHER): Payer: 59 | Admitting: Family Medicine

## 2022-04-16 VITALS — BP 118/78 | HR 66 | Temp 97.9°F | Ht 66.0 in | Wt 238.0 lb

## 2022-04-16 DIAGNOSIS — E669 Obesity, unspecified: Secondary | ICD-10-CM | POA: Diagnosis not present

## 2022-04-16 DIAGNOSIS — Z6838 Body mass index (BMI) 38.0-38.9, adult: Secondary | ICD-10-CM

## 2022-04-16 DIAGNOSIS — E88819 Insulin resistance, unspecified: Secondary | ICD-10-CM | POA: Diagnosis not present

## 2022-04-16 DIAGNOSIS — F3289 Other specified depressive episodes: Secondary | ICD-10-CM

## 2022-04-16 DIAGNOSIS — E559 Vitamin D deficiency, unspecified: Secondary | ICD-10-CM

## 2022-04-25 NOTE — Progress Notes (Unsigned)
Chief Complaint:   OBESITY Amber Cobb is here to discuss her progress with her obesity treatment plan along with follow-up of her obesity related diagnoses. Amber Cobb is on the Category 2 Plan and states she is following her eating plan approximately 70% of the time. Amber Cobb states she is not exercising.  Today's visit was #: 5 Starting weight: 252 LBS Starting date: 12/27/2021 Today's weight: 238 LBS Today's date: 04/16/2022 Total lbs lost to date: 14 LBS Total lbs lost since last in-office visit: +5 LBS  Interim History: Stress levels are high, 45 year old daughter moved to [place] recently.  This has led to more stress eating, snacking on the road.  Motivation is still there.  She is staying off of SSB's.  She has started using resistance bands.  She is wearing a smart watch to track her steps.  Cravings have been reduced.  Subjective:   1. Insulin resistance Fasting insulin 22.4.  She is actively working on reducing sugar intake and increasing walking time.  2. Vitamin D deficiency 12/27/2021.  Vitamin D level was 26.  She is on prescription vitamin D 50,000 IU weekly.  3. Other depression,with emotional eating Improved on Wellbutrin SR 150 mg twice daily.  Good support at home.  Assessment/Plan:   1. Insulin resistance Consider use of metformin in the future.  2. Vitamin D deficiency Continue prescription vitamin D weekly, recheck in 1 to 2 months.  3. Other depression,with emotional eating Practice mindful eating, and stress reduction.  Continue Wellbutrin SR 150 mg twice daily.  4. Obesity,current BMI 38.4 Set Goal to 10,000 daily.  Amber Cobb is currently in the action stage of change. As such, her goal is to continue with weight loss efforts. She has agreed to the Category 2 Plan.   Exercise goals:  Aim for 10,000 steps every day.  Behavioral modification strategies: increasing lean protein intake, increasing vegetables, increasing water intake, meal planning  and cooking strategies, keeping healthy foods in the home, emotional eating strategies, holiday eating strategies , and planning for success.  Amber Cobb has agreed to follow-up with our clinic in 4 weeks. She was informed of the importance of frequent follow-up visits to maximize her success with intensive lifestyle modifications for her multiple health conditions.   Objective:   Blood pressure 118/78, pulse 66, temperature 97.9 F (36.6 C), height '5\' 6"'$  (1.676 m), weight 238 lb (108 kg), SpO2 97 %. Body mass index is 38.41 kg/m.  General: Cooperative, alert, well developed, in no acute distress. HEENT: Conjunctivae and lids unremarkable. Cardiovascular: Regular rhythm.  Lungs: Normal work of breathing. Neurologic: No focal deficits.   Lab Results  Component Value Date   CREATININE 0.83 12/27/2021   BUN 11 12/27/2021   NA 139 12/27/2021   K 4.3 12/27/2021   CL 104 12/27/2021   CO2 18 (L) 12/27/2021   Lab Results  Component Value Date   ALT 14 12/27/2021   AST 11 12/27/2021   ALKPHOS 78 12/27/2021   BILITOT 0.3 12/27/2021   Lab Results  Component Value Date   HGBA1C 5.6 12/27/2021   Lab Results  Component Value Date   INSULIN 22.4 12/27/2021   Lab Results  Component Value Date   TSH 1.280 12/27/2021   Lab Results  Component Value Date   CHOL 194 12/27/2021   HDL 39 (L) 12/27/2021   LDLCALC 104 (H) 12/27/2021   TRIG 299 (H) 12/27/2021   CHOLHDL 5.0 (H) 12/27/2021   No results found for: "VD25OH" Lab Results  Component Value Date   WBC 6.1 12/27/2021   HGB 13.7 12/27/2021   HCT 41.2 12/27/2021   MCV 90 12/27/2021   PLT 320 12/27/2021   Lab Results  Component Value Date   IRON 85 12/27/2021   TIBC 374 12/27/2021   FERRITIN 98 12/27/2021    Attestation Statements:   Reviewed by clinician on day of visit: allergies, medications, problem list, medical history, surgical history, family history, social history, and previous encounter notes.  I, Davy Pique, am acting as Location manager for Amber Gambler, DO.  I have reviewed the above documentation for accuracy and completeness, and I agree with the above. Dell Ponto, DO

## 2022-04-26 NOTE — Progress Notes (Signed)
[facility] [address] [place]. [place] [address] [phone]       OFFICE FOLLOW UP NOTE  [name] Date of Birth:  [birth year] Medical Record Number:  [id number]   Reason for visit: Initial CPAP follow-up    SUBJECTIVE:   CHIEF COMPLAINT:  Chief Complaint  Patient presents with   Obstructive Sleep Apnea    Rm 3 with baby Pt is well and stable, no CPAP concerns     HPI:    Update 04/30/2022 JM: Patient returns for initial CPAP compliance visit.  Completed sleep study 01/12/2022 which showed mild OSA with AHI of 10 and recommend initiation of CPAP given history of IIH.  CPAP initiated 02/16/2022. Reports doing well with CPAP. Tolerating well. Can wake up in the middle of the night at times and will remove mask while sleeping, this has been happening less and less.  Epworth Sleepiness Scale 9/24 (prior to CPAP 16/24).        Consult visit 12/19/2021 Dr. Brett Fairy: Amber Cobb is a 46 y.o.  Caucasian female patient seen here as a referral from dr Jaynee Eagles  on 12/19/2021. Chief concern according to patient :"  I am very sleepy, very fatigued and  have headaches while on high dose Topamax ( 200 mg/ day).  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  Anemia, Anxiety, Carpal tunnel syndrome, Complex atypical endometrial hyperplasia, Depression, Diverticulosis, Endometrial cancer ([place]), Gallstones, Generalized anxiety disorder, GERD (gastroesophageal reflux disease), History of kidney stones, Hypertension, Migraines, Obesity, and UTI (urinary tract infection).     Sleep relevant medical history: Nocturia once, waking up several times- light  sleeper, prone sleeper- obesity.    Family medical /sleep history: 2 other family members on CPAP with OSA.  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 2 dogs,The patient currently works from home, since 2021 Tobacco use= never .  ETOH use - none ,  Caffeine intake in form of Coffee( /) Soda( /) Tea ( only when eating out) or energy drinks. Regular exercise in form of -nothing.    Sleep habits are as follows: The patient's dinner time is between 4-9  PM, influenced by husbands work schedule.  The patient goes to bed at 9 -12 PM and continues to sleep for 7-8 hours, wakes for one bathroom break,. The preferred sleep position is prone, with the support of 1-2 pillows.  Dreams are reportedly frequent/vivid.  The patient wakes up with an alarm at 8-9 AM.  9 AM is the usual rise time.  He/ She reports not feeling refreshed or restored in AM, with symptoms such as dry mouth, morning headaches, and residual fatigue.  Naps are taken frequently, lasting from 10 to 30 minutes and are more refreshing than nocturnal sleep      ROS:   14 system review of systems performed and negative with exception of those listed in HPI  PMH:  Past Medical History:  Diagnosis Date   Anemia    Anxiety  Back pain    Carpal tunnel syndrome    Complex atypical endometrial hyperplasia    Depression    Diverticulosis    Edema of both lower extremities    Endometrial cancer ([facility])    Gallbladder disorder    Gallstones    Generalized anxiety disorder    GERD (gastroesophageal reflux disease)    History of kidney stones    Hypertension    Joint pain    Migraines    Obesity    UTI (urinary tract infection)     PSH:  Past Surgical History:  Procedure Laterality Date   CARPAL TUNNEL RELEASE Bilateral    CHOLECYSTECTOMY     ENDOMETRIAL BIOPSY  05/05/2019   ROBOTIC  ASSISTED TOTAL HYSTERECTOMY WITH BILATERAL SALPINGO OOPHERECTOMY N/A 06/17/2019   Procedure: XI ROBOTIC ASSISTED TOTAL HYSTERECTOMY WITH BILATERAL SALPINGECTOMY AND  LYMPH NODE DISSECTION;  Surgeon: Lafonda Mosses, MD;  Location: [facility];  Service: Gynecology;  Laterality: N/A;   SENTINEL NODE BIOPSY N/A 06/17/2019   Procedure: SENTINEL NODE BIOPSY;  Surgeon: Lafonda Mosses, MD;  Location: [facility];  Service: Gynecology;  Laterality: N/A;    Social History:  Social History   Socioeconomic History   Marital status: Married    Spouse name: Timothy   Number of children: 2   Years of education: Not on file   Highest education level: Associate degree: occupational, Hotel manager, or vocational program  Occupational History   Not on file  Tobacco Use   Smoking status: Never   Smokeless tobacco: Never  Vaping Use   Vaping Use: Never used  Substance and Sexual Activity   Alcohol use: Not Currently   Drug use: Never   Sexual activity: Yes  Other Topics Concern   Not on file  Social History Narrative   Lives at home with husband and children   R handed   Caffeine: none   Social Determinants of Radio broadcast assistant Strain: Not on file  Food Insecurity: Not on file  Transportation Needs: Not on file  Physical Activity: Not on file  Stress: Not on file  Social Connections: Not on file  Intimate Partner Violence: Not on file    Family History:  Family History  Problem Relation Age of Onset   Obesity Mother    Anxiety disorder Mother    Depression Mother    Hypertension Mother    Diabetes Mother    COPD Mother    Thyroid disease Mother    Migraines Mother    High Cholesterol Mother    Sleep apnea Mother    Obesity Father    Thyroid disease Sister    Migraines Sister    Thyroid disease Brother    Endometrial cancer Maternal Aunt    Lung cancer Maternal Uncle    Throat cancer Maternal Grandfather    Colon cancer Neg Hx     Esophageal cancer Neg Hx    Pancreatic cancer Neg Hx    Colon polyps Neg Hx     Medications:   Current Outpatient Medications on File Prior to Visit  Medication Sig Dispense Refill   buPROPion (WELLBUTRIN SR) 150 MG 12 hr tablet Take 1 tablet (150 mg total) by mouth 2 (two) times daily. 180 tablet 0   butalbital-acetaminophen-caffeine (FIORICET) 50-325-40 MG tablet Take by mouth 2 (two) times daily as needed for headache.     escitalopram (LEXAPRO) 10 MG tablet Take 10 mg by mouth daily.  hydrocortisone (ANUSOL-HC) 25 MG suppository Place 1 suppository (25 mg total) rectally every 12 (twelve) hours. 12 suppository 1   loratadine (CLARITIN) 10 MG tablet Take 10 mg by mouth daily.     losartan (COZAAR) 100 MG tablet Take 100 mg by mouth daily.     Melatonin 5 MG CAPS Take by mouth.     modafinil (PROVIGIL) 200 MG tablet Take 1 tablet (200 mg total) by mouth daily. 30 tablet 2   Multiple Vitamin (MULTIVITAMIN) capsule Take 1 capsule by mouth daily.     omeprazole (PRILOSEC) 40 MG capsule Take 40 mg by mouth daily as needed.     topiramate ER (QUDEXY XR) 200 MG CS24 sprinkle capsule Take 1 capsule (200 mg total) by mouth at bedtime. 90 capsule 3   Vitamin D, Ergocalciferol, (DRISDOL) 1.25 MG (50000 UNIT) CAPS capsule Take 1 capsule (50,000 Units total) by mouth every 7 (seven) days. 12 capsule 0   No current facility-administered medications on file prior to visit.    Allergies:   Allergies  Allergen Reactions   Chloraprep One Step [Chlorhexidine Gluconate] Hives      OBJECTIVE:  Physical Exam  Vitals:   04/30/22 1236  BP: 132/81  Pulse: 78  Weight: 240 lb (108.9 kg)  Height: '5\' 6"'$  (1.676 m)   Body mass index is 38.74 kg/m. No results found.   General: well developed, well nourished, very pleasant middle-age Caucasian female, seated, in no evident distress Head: head normocephalic and atraumatic.   Neck: supple with no carotid or supraclavicular  bruits Cardiovascular: regular rate and rhythm, no murmurs Musculoskeletal: no deformity Skin:  no rash/petichiae Vascular:  Normal pulses all extremities   Neurologic Exam Mental Status: Awake and fully alert. Oriented to place and time. Recent and remote memory intact. Attention span, concentration and fund of knowledge appropriate. Mood and affect appropriate.  Cranial Nerves: Pupils equal, briskly reactive to light. Extraocular movements full without nystagmus. Visual fields full to confrontation. Hearing intact. Facial sensation intact. Face, tongue, palate moves normally and symmetrically.  Motor: Normal bulk and tone. Normal strength in all tested extremity muscles Sensory.: intact to touch , pinprick , position and vibratory sensation.  Coordination: Rapid alternating movements normal in all extremities. Finger-to-nose and heel-to-shin performed accurately bilaterally. Gait and Station: Arises from chair without difficulty. Stance is normal. Gait demonstrates normal stride length and balance without use of AD.  Reflexes: 1+ and symmetric. Toes downgoing.         ASSESSMENT/PLAN: Amber Cobb is a 46 y.o. year old female    OSA on CPAP : Compliance report shows satisfactory usage with optimal residual AHI.  Discussed continued nightly usage with ensuring greater than 4 hours nightly for optimal benefit and per insurance purposes.  Continue to follow with DME company for any needed supplies or CPAP related concerns     Follow up in 8 months or call earlier if needed   CC:  PCP: Ollen Bowl, MD    I spent 21 minutes of face-to-face and non-face-to-face time with patient.  This included previsit chart review, lab review, study review, order entry, electronic health record documentation, patient education regarding diagnosis of sleep apnea with review and discussion of compliance report and answered all other questions to patient's satisfaction   Frann Rider,  [facility]  [facility] [address] [place], [place] [address]  Phone [phone] Fax [phone] Note: This document was prepared with digital dictation and possible smart phrase technology. Any transcriptional errors  that result from this process are unintentional.

## 2022-04-30 ENCOUNTER — Encounter: Payer: Self-pay | Admitting: Adult Health

## 2022-04-30 ENCOUNTER — Ambulatory Visit (INDEPENDENT_AMBULATORY_CARE_PROVIDER_SITE_OTHER): Payer: 59 | Admitting: Adult Health

## 2022-04-30 VITALS — BP 132/81 | HR 78 | Ht 66.0 in | Wt 240.0 lb

## 2022-04-30 DIAGNOSIS — G4733 Obstructive sleep apnea (adult) (pediatric): Secondary | ICD-10-CM

## 2022-05-21 ENCOUNTER — Ambulatory Visit (INDEPENDENT_AMBULATORY_CARE_PROVIDER_SITE_OTHER): Payer: Managed Care, Other (non HMO) | Admitting: Family Medicine

## 2022-05-21 ENCOUNTER — Telehealth (INDEPENDENT_AMBULATORY_CARE_PROVIDER_SITE_OTHER): Payer: Managed Care, Other (non HMO) | Admitting: Neurology

## 2022-05-21 ENCOUNTER — Encounter (INDEPENDENT_AMBULATORY_CARE_PROVIDER_SITE_OTHER): Payer: Self-pay | Admitting: Family Medicine

## 2022-05-21 VITALS — BP 131/79 | HR 64 | Temp 97.9°F | Ht 66.0 in | Wt 235.0 lb

## 2022-05-21 DIAGNOSIS — G43909 Migraine, unspecified, not intractable, without status migrainosus: Secondary | ICD-10-CM

## 2022-05-21 DIAGNOSIS — G43009 Migraine without aura, not intractable, without status migrainosus: Secondary | ICD-10-CM

## 2022-05-21 DIAGNOSIS — E88819 Insulin resistance, unspecified: Secondary | ICD-10-CM | POA: Diagnosis not present

## 2022-05-21 DIAGNOSIS — E559 Vitamin D deficiency, unspecified: Secondary | ICD-10-CM

## 2022-05-21 DIAGNOSIS — I10 Essential (primary) hypertension: Secondary | ICD-10-CM

## 2022-05-21 DIAGNOSIS — F3289 Other specified depressive episodes: Secondary | ICD-10-CM

## 2022-05-21 DIAGNOSIS — G932 Benign intracranial hypertension: Secondary | ICD-10-CM

## 2022-05-21 DIAGNOSIS — Z6837 Body mass index (BMI) 37.0-37.9, adult: Secondary | ICD-10-CM

## 2022-05-21 DIAGNOSIS — E669 Obesity, unspecified: Secondary | ICD-10-CM

## 2022-05-21 MED ORDER — BUPROPION HCL ER (SR) 150 MG PO TB12: 150.0000 mg | ORAL_TABLET | Freq: Two times a day (BID) | ORAL | 0 refills | Status: DC

## 2022-05-21 MED ORDER — VITAMIN D (ERGOCALCIFEROL) 1.25 MG (50000 UNIT) PO CAPS: 50000.0000 [IU] | ORAL_CAPSULE | ORAL | 0 refills | Status: DC

## 2022-05-21 MED ORDER — LOSARTAN POTASSIUM 50 MG PO TABS: 50.0000 mg | ORAL_TABLET | Freq: Every day | ORAL | 0 refills | Status: DC

## 2022-05-21 NOTE — Patient Instructions (Signed)
Continue trokendi Will get results from hecker We will see her back in April - make a note to get hecker's notes from March 15th.  Office call you for appt in 6 months.

## 2022-05-21 NOTE — Progress Notes (Unsigned)
[facility]    Provider:  Dr Jaynee Eagles Requesting Provider: Ollen Bowl, MD Primary Care Provider:  Ollen Bowl, MD  CC:  papilledema  Virtual Visit via Video Note  I connected with Amber Cobb on 05/21/2022 at  1:30 PM EST by a video enabled telemedicine application and verified that I am speaking with the correct person using two identifiers.  Location: Patient: home Provider: office   I discussed the limitations of evaluation and management by telemedicine and the availability of in person appointments. The patient expressed understanding and agreed to proceed.   Follow Up Instructions:    I discussed the assessment and treatment plan with the patient. The patient was provided an opportunity to ask questions and all were answered. The patient agreed with the plan and demonstrated an understanding of the instructions.   The patient was advised to call back or seek an in-person evaluation if the symptoms worsen or if the condition fails to improve as anticipated.  I provided 40 minutes of non-face-to-face time during this encounter.   [name], MD  05/21/2022: Her prescription is better she saw [facility] in December (I only have notes from 9/1) on 12/15 the [place] edema was improved per patient. On Topamax and doing well. She was diagnosed with mild OSA and is using cpap. In 9/1 she had grade 2 papilledema. I need her notes from [facility] from December, will request. She I not having side effects on the extended release '200mg'$  trokendi. She has brain freeze headaches but not the all day every day headaches so headaches are better. No synope, sometimes a little dizziness on standing. Request [facility]'s notes. She sees Herbert Deaner in March so we will see her after that. Mild numbness or tingling but that is more in the cold. May be Raynaud or just normal vasoconstriction.   Patient complains of symptoms per HPI as well as the following symptoms: losing weight .  Pertinent negatives and positives per HPI. 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 Had sleep evaluation and is getting a sleep study in September. She is on Topamax '100mg'$  BID and she has been to the weight clinic and has lost 5 pounds too and the headache has eased up a bit. She has a lot more energy. Having fatigue with the topiramate IR will change to ER and can take it all at night. She is seeing [facility] care on the 1st again. She will contact me with the results and I'll get the notes. She was having daily headaches, doing "much better" or any in the last 2 weeks. MRI partially empty sella.   Patient complains of symptoms per HPI as well as the following symptoms: drowsiness due to medication . Pertinent negatives and positives per HPI. All others negative   HPI:  Amber Cobb is a 47 y.o. 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  I reviewed had her eye care and she was seen for headache, having headaches that feel like "a brain freeze" will hurt really bad for a minute then go away, she has been going on for almost a year, patient states that her right upper lid is drooping, she has a past medical history of acid reflux, anemia, migraines, anxiety, depression, hypertension.  Examination showed visual acuity OD 20/25 and OS 20/20, pupils equally round and reactive to light no APD, extraocular  movements full, it did show optic nerve edema OU and a mild ptosis, headaches going on for a year, no other neurologic symptoms, worse with hard laughing or bowel movement, they ordered an MRI of the head and neck to rule out masses and tumors, no ophthalmoplegia, discussed that she may need an LP as well.     She has a low level headache every day for 1.5 years. With pressure she gets worse symptoms of headaches, low level headache  every day, mostly in the forehead, she snores heavily, headaches every day, she has a film over her eyes, she cannot lose weight, she has migraines those are different and not often, she started having migraines about 4 years ago and she was waking up with them when she gets a migraine pulsating, pounding, light and sound sensitivity and nausea but the daily headaches are different. She feels pressure in ears, hears swishing in the ears. She has gained weight. No energy. She naps every day, 3x a day, daily headaches are mild to moderate, migraines can be severe 1-2x a week, ibuprofen. Ibuprofen helps. Migraines for 4 years. She is not interested in this time with a triptan, ibuprofen helps, discussed rebound headache with ibuprofen.No other focal neurologic deficits, associated symptoms, inciting events or modifiable factors.  Reviewed notes, labs and imaging from outside physicians, which showed:  CT angio of the neck: Showed normal vasculature of the neck, the right vertebral artery is diminutive and nondominant, normal variant, likely accounting for the findings on the prior MRI.  MRI of the neck soft tissue: Showed abnormal appearance of the right vertebral artery, neck CT was recommended as above and was unremarkable.  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 01/2021: tsh,cbc,cmp normal  Review of Systems: Patient complains of symptoms per HPI as well as the following symptoms headaches. Pertinent negatives and positives per HPI. All others negative.   Social History   Socioeconomic History   Marital status: Married    Spouse name: Christia Reading   Number of children: 2   Years of education: Not on file   Highest education level: Associate degree: occupational, Hotel manager, or vocational program  Occupational History   Not on file  Tobacco Use   Smoking  status: Never   Smokeless tobacco: Never  Vaping Use   Vaping Use: Never used  Substance and Sexual Activity   Alcohol use: Not Currently   Drug use: Never   Sexual activity: Yes  Other Topics Concern   Not on file  Social History Narrative   Lives at home with husband and children   R handed   Caffeine: none   Social Determinants of Radio broadcast assistant Strain: Not on file  Food Insecurity: Not on file  Transportation Needs: Not on file  Physical Activity: Not on file  Stress: Not on file  Social Connections: Not on file  Intimate Partner Violence: Not on file    Family History  Problem Relation Age of Onset   Obesity Mother    Anxiety disorder Mother    Depression Mother    Hypertension Mother    Diabetes Mother    COPD Mother    Thyroid disease Mother    Migraines Mother    High Cholesterol Mother    Sleep apnea Mother    Obesity Father    Thyroid disease Sister    Migraines Sister  Thyroid disease Brother    Endometrial cancer Maternal Aunt    Lung cancer Maternal Uncle    Throat cancer Maternal Grandfather    Colon cancer Neg Hx    Esophageal cancer Neg Hx    Pancreatic cancer Neg Hx    Colon polyps Neg Hx     Past Medical History:  Diagnosis Date   Anemia    Anxiety    Back pain    Carpal tunnel syndrome    Complex atypical endometrial hyperplasia    Depression    Diverticulosis    Edema of both lower extremities    Endometrial cancer ([place])    Gallbladder disorder    Gallstones    Generalized anxiety disorder    GERD (gastroesophageal reflux disease)    History of kidney stones    Hypertension    Joint pain    Migraines    Obesity    UTI (urinary tract infection)     Patient Active Problem List   Diagnosis Date Noted   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   OSA (obstructive sleep apnea) 02/08/2022   Essential hypertension 02/08/2022   Sleep-disordered breathing 01/10/2022   Vitamin D deficiency 01/10/2022    Insulin resistance 01/10/2022   Depression 19/14/7829   Metabolic syndrome 56/21/3086   Excessive daytime sleepiness 12/19/2021   Chronic fatigue 12/19/2021   Snoring 12/19/2021   IIH (idiopathic intracranial hypertension) 11/06/2021   Chronic daily headache 11/06/2021   Endometrial cancer ([place]) 06/22/2019   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    Past Surgical History:  Procedure Laterality Date   CARPAL TUNNEL RELEASE Bilateral    CHOLECYSTECTOMY     ENDOMETRIAL BIOPSY  05/05/2019   ROBOTIC ASSISTED TOTAL HYSTERECTOMY WITH BILATERAL SALPINGO OOPHERECTOMY N/A 06/17/2019   Procedure: XI ROBOTIC ASSISTED TOTAL HYSTERECTOMY WITH BILATERAL SALPINGECTOMY AND  LYMPH NODE DISSECTION;  Surgeon: Lafonda Mosses, MD;  Location: [facility];  Service: Gynecology;  Laterality: N/A;   SENTINEL NODE BIOPSY N/A 06/17/2019   Procedure: SENTINEL NODE BIOPSY;  Surgeon: Lafonda Mosses, MD;  Location: [facility];  Service: Gynecology;  Laterality: N/A;    Current Outpatient Medications  Medication Sig Dispense Refill   buPROPion (WELLBUTRIN SR) 150 MG 12 hr tablet Take 1 tablet (150 mg total) by mouth 2 (two) times daily. 180 tablet 0   butalbital-acetaminophen-caffeine (FIORICET) 50-325-40 MG tablet Take by mouth 2 (two) times daily as needed for headache.     escitalopram (LEXAPRO) 10 MG tablet Take 10 mg by mouth daily.     hydrocortisone (ANUSOL-HC) 25 MG suppository Place 1 suppository (25 mg total) rectally every 12 (twelve) hours. 12 suppository 1   loratadine (CLARITIN) 10 MG tablet Take 10 mg by mouth daily.     losartan (COZAAR) 50 MG tablet Take 1 tablet (50 mg total) by mouth daily. 30 tablet 0   Melatonin 5 MG CAPS Take by mouth.     modafinil (PROVIGIL) 200 MG tablet Take 1 tablet (200 mg total) by mouth daily. 30 tablet 2   Multiple Vitamin (MULTIVITAMIN) capsule Take 1 capsule by mouth daily.     omeprazole (PRILOSEC) 40 MG  capsule Take 40 mg by mouth daily as needed.     topiramate ER (QUDEXY XR) 200 MG CS24 sprinkle capsule Take 1 capsule (200 mg total) by mouth at bedtime. 90 capsule 3   Vitamin D, Ergocalciferol, (DRISDOL) 1.25 MG (50000 UNIT) CAPS capsule Take 1 capsule (50,000 Units total) by mouth every 7 (  seven) days. 12 capsule 0   No current facility-administered medications for this visit.    Allergies as of 05/21/2022 - Review Complete 05/21/2022  Allergen Reaction Noted   Chloraprep one step [chlorhexidine gluconate] Hives 07/08/2019    Vitals: There were no vitals taken for this visit. Last Weight:  Wt Readings from Last 1 Encounters:  05/21/22 235 lb (106.6 kg)   Last Height:   Ht Readings from Last 1 Encounters:  05/21/22 '5\' 6"'$  (1.676 m)      Physical exam: Exam: Gen: NAD, conversant      CV: Could not perform over Web Video. No signs of palpitations or chest pain or SOB. VS: Breathing at a normal rate. Not febrile. Eyes: Conjunctivae clear without exudates or hemorrhage  Neuro: Detailed Neurologic Exam  Speech:    Speech is normal; fluent and spontaneous with normal comprehension.  Cognition:    The patient is oriented to person, place, and time;     recent and remote memory intact;     language fluent;     normal attention, concentration,     fund of knowledge Cranial Nerves:    The pupils are equal, round, and reactive to light. Cannot perform fundoscopic exam. Visual fields are full to finger confrontation. Extraocular movements are intact.  The face is symmetric with normal sensation. The palate elevates in the midline. Hearing intact. Voice is normal. Shoulder shrug is normal. The tongue has normal motion without fasciculations.   Coordination:    Normal finger to nose  Gait:    Normal native gait  Motor Observation:   no involuntary movements noted. Tone:    Appears normal  Posture:    Posture is normal. normal erect    Strength:    Strength is  anti-gravity and symmetric in the upper and lower limbs.      Sensation: intact to LT            Assessment/Plan:  Patient with chronic daily headaches, optic nerve head edema and migraines. MRI brain/CTA/CTV head unremarkable(MRI partially empty sella. ), opening pressure 26, doing much better, intolerance to Topiramate IR changed to ER.     -Opening pressure was 26 consistent with IDIOPATHIC INTRACRANIAL HYPERTENSION.  - Had sleep evaluation mild OSA on a cpap - She is on Trokendi '200mg'$  continue - repeat eye exam in march we will see her after that -Weight loss is critical, discussed weight loss and risk of permanent vision loss, seeing healthy weight and wellness - CTV for venous thrombosis  No evidence of intracranial venous thrombosis,  No convincing dural venous sinus stenosis. - We will continue her medication as is, [facility]er will recheck dilated exam in 3 to 4 months make sure we have that result before next exam, will see her in the office next time, at next appt they stated will dilate with fundi photos, at that time if no improvement in papilledema will need to check topiramate levels, ensure compliance, and possibly discuss either increase in topiramate or changing management. See details below on [facility].   I reviewed notes from [facility] from January 12, 2022, at that time improved, [place] OD 2020 and OS 2020 at that time she had just started Topamax and her headaches were worse OCT RNFL and exam showed worsening papilledema OS greater than OD, and she had not had her lumbar puncture yet.  I called [facility] office and received more recent notes from January 26, 2022.,  At that time distance was  still 2020 OD and OS corrected, headaches are improved she does get some brain freeze headaches when it is cold, on exam pupils are equally round and reactive to light, there was no APD, extraocular movements were full, normal adnexa, anterior chamber was deep and  quiet, posterior examination showed no hemorrhages no holes or tears, she still had grade 1 nerve swelling OD and grade 1 nerve swelling OS, no ptosis was noted on this exam and no anisocoria was noted stated that pupils were equally round and reactive to light so I am less inclined to keep Horner syndrome in the differential.  We will continue her medication as is, they will recheck dilated exam in 3 to 4 months with fundi photos, at that time if no improvement will need to check topiramate levels, ensure compliance, and possibly discuss either increase in topiramate or changing management.  Meds ordered this encounter  Medications   topiramate ER (QUDEXY XR) 200 MG CS24 sprinkle capsule    Sig: Take 1 capsule (200 mg total) by mouth at bedtime.    Dispense:  90 capsule    Refill:  3    Cannot tolerate IR drowsiness. Can be generic     For any acute change especially worsening headache or vision loss call 911 and proceed to ED  To prevent or relieve headaches, try the following: Cool Compress. Lie down and place a cool compress on your head.  Avoid headache triggers. If certain foods or odors seem to have triggered your migraines in the past, avoid them. A headache diary might help you identify triggers.  Include physical activity in your daily routine. Try a daily walk or other moderate aerobic exercise.  Manage stress. Find healthy ways to cope with the stressors, such as delegating tasks on your to-do list.  Practice relaxation techniques. Try deep breathing, yoga, massage and visualization.  Eat regularly. Eating regularly scheduled meals and maintaining a healthy diet might help prevent headaches. Also, drink plenty of fluids.  Follow a regular sleep schedule. Sleep deprivation might contribute to headaches Consider biofeedback. With this mind-body technique, you learn to control certain bodily functions -- such as muscle tension, heart rate and blood pressure -- to prevent headaches or  reduce headache pain.    Proceed to emergency room if you experience new or worsening symptoms or symptoms do not resolve, if you have new neurologic symptoms or if headache is severe, or for any concerning symptom.   Provided education and documentation from American headache Society toolbox including articles on: pseudotumoer cerebri(IIH), chronic migraine medication overuse headache, chronic migraines, prevention of migraines and other headaches, behavioral and other nonpharmacologic treatments for headache.  Meds ordered this encounter  Medications   topiramate ER (QUDEXY XR) 200 MG CS24 sprinkle capsule    Sig: Take 1 capsule (200 mg total) by mouth at bedtime.    Dispense:  90 capsule    Refill:  3    Cannot tolerate IR drowsiness. Can be generic     Cc: Pallone, Jarvis Newcomer, MD,  Ollen Bowl, MD  Sarina Ill, MD  [facility] [address] [place], [place] [address]  Phone [phone] Fax [phone]

## 2022-05-23 ENCOUNTER — Telehealth: Payer: Self-pay | Admitting: Neurology

## 2022-05-23 ENCOUNTER — Encounter: Payer: Self-pay | Admitting: Neurology

## 2022-05-23 MED ORDER — TOPIRAMATE ER 200 MG PO SPRINKLE CAP24: 200.0000 mg | EXTENDED_RELEASE_CAPSULE | Freq: Every evening | ORAL | 3 refills | Status: DC

## 2022-05-23 NOTE — Telephone Encounter (Signed)
Pt scheduled for follow up with Dr. Jaynee Eagles due to Janett Billow being out and working with pt's schedule. Follow up has been scheduled for 09/03/22 at 3:00pm

## 2022-05-23 NOTE — Telephone Encounter (Signed)
Please make a follow up with an NP at the middle of April after 15th, we want to make sure she has been to [facility] before next appointment(she is supposed to follow with them in March or beginning of April I believe). Tell her after reading the [facility] notes (stable) I'd like her to come to the office for next exam thanks.

## 2022-06-04 NOTE — Progress Notes (Signed)
Chief Complaint:   OBESITY Amber Cobb is here to discuss her progress with her obesity treatment plan along with follow-up of her obesity related diagnoses. [name] is on the Category 2 Plan and states she is following her eating plan approximately 50% of the time. [name] states she is not exercising.   Today's visit was #: 6 Starting weight: 252 lbs Starting date: 12/27/2021 Today's weight: 235 lbs Today's date: 05/21/2022 Total lbs lost to date: 17 lbs Total lbs lost since last in-office visit: 3 lbs  Interim History: Net weight loss:  17 lb in 4.5 months=6.7% TBW loss. Was off track over the holidays but is back on track.  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   Subjective:   1. Vitamin D deficiency Vitamin D level 26 on 12/27/2021.  Patient is on prescription Vitamin D 50,000 IU weekly.   2. Insulin resistance Fasting insulin 22.4 on 12/27/2021. Has been reducing intake of sugar.    3. Essential hypertension Blood pressure has been well controlled.  Patient ran out of losartan 100 mg one week ago.  Patient denies chest pain today.   4. Other depression with emotional eating Patient has been improving on Wellbutrin SR 150 mg BID.  Patient has a good support system.  She has been practicing mindful eating.   Assessment/Plan:   1. Vitamin D deficiency Check Vitamin D level at next office visit.   Refill - Vitamin D, Ergocalciferol, (DRISDOL) 1.25 MG (50000 UNIT) CAPS capsule; Take 1 capsule (50,000 Units total) by mouth every 7 (seven) days.  Dispense: 12 capsule; Refill: 0  2. Insulin resistance Recheck fasting insulin next visit.   3. Essential hypertension Reduce losartan to 50 mg daily.  Prescription sent for #30 tablets, no refills.   Reduce/decrease- losartan (COZAAR) 50 MG tablet; Take 1 tablet (50 mg total) by mouth daily.  Dispense: 30 tablet; Refill: 0  4. Other depression with emotional eating  Refill - bupropion (WELLBUTRIN SR) 150 MG 12 hr  tablet; Take 1 tablet (150 mg total) by mouth 2 (two) times daily.  Dispense: 180 tablet; Refill: 0  5. Obesity,current BMI 37.9 Resume Category 2 meal plan.  Increase daily steps to >8,000 per day.   Amber Cobb is currently in the action stage of change. As such, her goal is to continue with weight loss efforts. She has agreed to the Category 2 Plan.   Exercise goals: All adults should avoid inactivity. Some physical activity is better than none, and adults who participate in any amount of physical activity gain some health benefits.  Behavioral modification strategies: increasing lean protein intake, increasing vegetables, increasing water intake, no skipping meals, meal planning and cooking strategies, keeping healthy foods in the home, planning for success, and decreasing junk food.  Amber Cobb has agreed to follow-up with our clinic in 4 weeks. She was informed of the importance of frequent follow-up visits to maximize her success with intensive lifestyle modifications for her multiple health conditions.   Objective:   Blood pressure 131/79, pulse 64, temperature 97.9 F (36.6 C), height '5\' 6"'$  (1.676 m), weight 235 lb (106.6 kg), SpO2 96 %. Body mass index is 37.93 kg/m.  General: Cooperative, alert, well developed, in no acute distress. HEENT: Conjunctivae and lids unremarkable. Cardiovascular: Regular rhythm.  Lungs: Normal work of breathing. Neurologic: No focal deficits.   Lab Results  Component Value Date   CREATININE 0.83 12/27/2021   BUN 11 12/27/2021   NA 139 12/27/2021   K 4.3  12/27/2021   CL 104 12/27/2021   CO2 18 (L) 12/27/2021   Lab Results  Component Value Date   ALT 14 12/27/2021   AST 11 12/27/2021   ALKPHOS 78 12/27/2021   BILITOT 0.3 12/27/2021   Lab Results  Component Value Date   HGBA1C 5.6 12/27/2021   Lab Results  Component Value Date   INSULIN 22.4 12/27/2021   Lab Results  Component Value Date   TSH 1.280 12/27/2021   Lab Results   Component Value Date   CHOL 194 12/27/2021   HDL 39 (L) 12/27/2021   LDLCALC 104 (H) 12/27/2021   TRIG 299 (H) 12/27/2021   CHOLHDL 5.0 (H) 12/27/2021   No results found for: "VD25OH" Lab Results  Component Value Date   WBC 6.1 12/27/2021   HGB 13.7 12/27/2021   HCT 41.2 12/27/2021   MCV 90 12/27/2021   PLT 320 12/27/2021   Lab Results  Component Value Date   IRON 85 12/27/2021   TIBC 374 12/27/2021   FERRITIN 98 12/27/2021    Attestation Statements:   Reviewed by clinician on day of visit: allergies, medications, problem list, medical history, surgical history, family history, social history, and previous encounter notes.  I, Davy Pique, am acting as Location manager for Loyal Gambler, DO.  I have reviewed the above documentation for accuracy and completeness, and I agree with the above. Dell Ponto, DO

## 2022-06-15 ENCOUNTER — Encounter (INDEPENDENT_AMBULATORY_CARE_PROVIDER_SITE_OTHER): Payer: Self-pay

## 2022-06-15 DIAGNOSIS — I10 Essential (primary) hypertension: Secondary | ICD-10-CM

## 2022-06-19 MED ORDER — LOSARTAN POTASSIUM 50 MG PO TABS: 50.0000 mg | ORAL_TABLET | Freq: Every day | ORAL | 0 refills | Status: DC

## 2022-06-25 ENCOUNTER — Encounter (INDEPENDENT_AMBULATORY_CARE_PROVIDER_SITE_OTHER): Payer: Self-pay | Admitting: Family Medicine

## 2022-06-25 ENCOUNTER — Ambulatory Visit (INDEPENDENT_AMBULATORY_CARE_PROVIDER_SITE_OTHER): Payer: Managed Care, Other (non HMO) | Admitting: Family Medicine

## 2022-06-25 VITALS — BP 126/82 | HR 61 | Temp 98.6°F | Ht 66.0 in | Wt 237.0 lb

## 2022-06-25 DIAGNOSIS — E559 Vitamin D deficiency, unspecified: Secondary | ICD-10-CM | POA: Diagnosis not present

## 2022-06-25 DIAGNOSIS — F3289 Other specified depressive episodes: Secondary | ICD-10-CM | POA: Diagnosis not present

## 2022-06-25 DIAGNOSIS — I10 Essential (primary) hypertension: Secondary | ICD-10-CM

## 2022-06-25 DIAGNOSIS — Z6838 Body mass index (BMI) 38.0-38.9, adult: Secondary | ICD-10-CM

## 2022-06-25 DIAGNOSIS — E88819 Insulin resistance, unspecified: Secondary | ICD-10-CM | POA: Diagnosis not present

## 2022-06-25 MED ORDER — LOSARTAN POTASSIUM 50 MG PO TABS: 50.0000 mg | ORAL_TABLET | Freq: Every day | ORAL | 0 refills | Status: DC

## 2022-06-25 NOTE — Assessment & Plan Note (Signed)
Struggling more with emotional eating Keeping junk food out of the house Craves sweets Practicing mindfulness Unable to see Dr Mallie Mussel (lives in [place])

## 2022-06-25 NOTE — Assessment & Plan Note (Signed)
Has net weight loss of 15 pounds over the past 6 months of medically supervised weight management. Weight loss has slowed down with increased life stressors, emotional eating. Has room for improvement with more regular physical activity. Handouts provided for additional breakfast and lunch options. May vary dinner options staying under 700 cal including at least 30 g of protein.  Recommend visiting healthier websites like skinny [email or web address].[email or web address].

## 2022-06-25 NOTE — Assessment & Plan Note (Signed)
Taking RX vitamin D 50,000 IU weekly Energy level low with high stress Ran out of MVI daily

## 2022-06-25 NOTE — Assessment & Plan Note (Addendum)
Blood pressure is well-controlled with lower dose of losartan 50 mg once daily, reduced at her last visit.  Denies adverse side effects.

## 2022-06-25 NOTE — Assessment & Plan Note (Signed)
Last fasting insulin 22.4. Has been actively working on reducing intake of starches and sweets though is craving more junk food due to emotional stress. We discussed practicing mindful eating, stress reduction and keeping junk food snacks out of the house. She will benefit from walking 30 minutes 3 or more times a week to help with stress reduction and insulin resistance.

## 2022-06-25 NOTE — Progress Notes (Signed)
Office: [phone]  /  Fax: [phone]  WEIGHT SUMMARY AND BIOMETRICS  Medical Weight Loss Height: 5' 6"$  (1.676 m) Weight: 237 lb (107.5 kg) Temp: 98.6 F (37 C) Pulse Rate: 61 BP: 126/82 SpO2: 100 % Fasting: yes Labs: yes Today's Visit #: 7 Weight at Last VIsit: 235lb Weight Lost Since Last Visit: +2  Body Fat %: 47.2 % Fat Mass (lbs): 111.8 lbs Muscle Mass (lbs): 118.8 lbs Total Body Water (lbs): 87.8 lbs Visceral Fat Rating : 13 Starting Date: 12/27/21 Starting Weight: 252lb Total Weight Loss (lbs): 15 lb (6.804 kg)   HPI  Chief Complaint: OBESITY  Amber Cobb is here to discuss her progress with her obesity treatment plan. She is on the the Category 2 Plan and states she is following her eating plan approximately 85 % of the time. She states she is walking 30 minutes 2 times per week.   Interval History:  Since last office visit she is up 2 lb This gives her a net weight loss of 15 lb in the past 6 mos of medically supervised weight management Struggling more with emotional eating due to her mom's depression Tired of foods on meal plan and is having more cravings  Pharmacotherapy: on Buproprion SR 150 mg BID Takes Topiramate ER 200 mg once daily for ICH  PHYSICAL EXAM:  Blood pressure 126/82, pulse 61, temperature 98.6 F (37 C), height 5' 6"$  (1.676 m), weight 237 lb (107.5 kg), SpO2 100 %. Body mass index is 38.25 kg/m.  General: She is overweight, cooperative, alert, well developed, and in no acute distress. PSYCH: Has normal mood, affect and thought process.   HEENT: EOMI, sclerae are anicteric. Lungs: Normal breathing effort, no conversational dyspnea. Extremities: No edema.  Neurologic: No gross sensory or motor deficits. No tremors or fasciculations noted.    DIAGNOSTIC DATA REVIEWED:  BMET    Component Value Date/Time   NA 139 12/27/2021 1007   K 4.3 12/27/2021 1007   CL 104 12/27/2021 1007   CO2 18 (L) 12/27/2021 1007   GLUCOSE 91  12/27/2021 1007   GLUCOSE 137 (H) 06/15/2019 1437   BUN 11 12/27/2021 1007   CREATININE 0.83 12/27/2021 1007   CALCIUM 9.1 12/27/2021 1007   GFRNONAA >60 06/15/2019 1437   GFRAA >60 06/15/2019 1437   Lab Results  Component Value Date   HGBA1C 5.6 12/27/2021   Lab Results  Component Value Date   INSULIN 22.4 12/27/2021   Lab Results  Component Value Date   TSH 1.280 12/27/2021   CBC    Component Value Date/Time   WBC 6.1 12/27/2021 1007   WBC 7.9 06/15/2019 1437   RBC 4.57 12/27/2021 1007   RBC 4.40 06/15/2019 1437   HGB 13.7 12/27/2021 1007   HCT 41.2 12/27/2021 1007   PLT 320 12/27/2021 1007   MCV 90 12/27/2021 1007   MCH 30.0 12/27/2021 1007   MCH 30.5 06/15/2019 1437   MCHC 33.3 12/27/2021 1007   MCHC 32.4 06/15/2019 1437   RDW 12.7 12/27/2021 1007   Iron Studies    Component Value Date/Time   IRON 85 12/27/2021 1007   TIBC 374 12/27/2021 1007   FERRITIN 98 12/27/2021 1007   IRONPCTSAT 23 12/27/2021 1007   Lipid Panel     Component Value Date/Time   CHOL 194 12/27/2021 1007   TRIG 299 (H) 12/27/2021 1007   HDL 39 (L) 12/27/2021 1007   CHOLHDL 5.0 (H) 12/27/2021 1007   LDLCALC 104 (H) 12/27/2021 1007  Hepatic Function Panel     Component Value Date/Time   PROT 7.5 12/27/2021 1007   ALBUMIN 4.4 12/27/2021 1007   AST 11 12/27/2021 1007   ALT 14 12/27/2021 1007   ALKPHOS 78 12/27/2021 1007   BILITOT 0.3 12/27/2021 1007      Component Value Date/Time   TSH 1.280 12/27/2021 1007   TSH 1.612 12/23/2020 1515   Nutritional No results found for: "VD25OH"   ASSESSMENT AND PLAN  TREATMENT PLAN FOR OBESITY:  Recommended Dietary Goals  Amber Cobb is currently in the action stage of change. As such, her goal is to continue weight management plan. She has agreed to the Category 2 Plan.  Behavioral Intervention  We discussed the following Behavioral Modification Strategies today: increasing lean protein intake, increasing vegetables, increase water  intake, work on meal planning and easy cooking plans, think about ways to increase physical activity, and emotional eating strategies.  Additional resources provided today: NA  Recommended Physical Activity Goals  Amber Cobb has been advised to work up to 150 minutes of moderate intensity aerobic activity a week and strengthening exercises 2-3 times per week for cardiovascular health, weight loss maintenance and preservation of muscle mass.   She has agreed to increase physical activity in their day and reduce sedentary time (increase NEAT).    Pharmacotherapy We discussed various medication options to help Amber Cobb with her weight loss efforts and we both agreed to no changes.  ASSOCIATED CONDITIONS ADDRESSED TODAY  Essential hypertension Assessment & Plan: Blood pressure is well-controlled with lower dose of losartan 50 mg once daily, reduced at her last visit.  Denies adverse side effects.   Orders: -     Losartan Potassium; Take 1 tablet (50 mg total) by mouth daily.  Dispense: 90 tablet; Refill: 0  Morbid obesity (Amber Cobb) Assessment & Plan: Has net weight loss of 15 pounds over the past 6 months of medically supervised weight management. Weight loss has slowed down with increased life stressors, emotional eating. Has room for improvement with more regular physical activity. Handouts provided for additional breakfast and lunch options. May vary dinner options staying under 700 cal including at least 30 g of protein.  Recommend visiting healthier websites like skinny [email or web address].   Other depression with emotional eating Assessment & Plan: Struggling more with emotional eating Keeping junk food out of the house Craves sweets Practicing mindfulness Unable to see Dr Mallie Mussel (lives in [place])    Vitamin D deficiency Assessment & Plan: Taking RX vitamin D 50,000 IU weekly Energy level low with high stress Ran out of MVI daily  Orders: -     VITAMIN D 25 Hydroxy (Vit-D Deficiency,  Fractures)  Insulin resistance Assessment & Plan: Last fasting insulin 22.4. Has been actively working on reducing intake of starches and sweets though is craving more junk food due to emotional stress. We discussed practicing mindful eating, stress reduction and keeping junk food snacks out of the house. She will benefit from walking 30 minutes 3 or more times a week to help with stress reduction and insulin resistance.  Orders: -     Insulin, random  BMI 38.0-38.9,adult      No follow-ups on file.Marland Kitchen She was informed of the importance of frequent follow up visits to maximize her success with intensive lifestyle modifications for her multiple health conditions.   ATTESTASTION STATEMENTS:  Reviewed by clinician on day of visit: allergies, medications, problem list, medical history, surgical history, family history, social history, and previous encounter notes.   Time  spent on visit including pre-visit chart review and post-visit care and charting was 30 minutes.    Dell Ponto, DO

## 2022-06-26 LAB — INSULIN, RANDOM: INSULIN: 18.3 u[IU]/mL (ref 2.6–24.9)

## 2022-06-26 LAB — VITAMIN D 25 HYDROXY (VIT D DEFICIENCY, FRACTURES): Vit D, 25-Hydroxy: 34.8 ng/mL (ref 30.0–100.0)

## 2022-07-09 ENCOUNTER — Encounter: Payer: Self-pay | Admitting: Gynecologic Oncology

## 2022-07-16 ENCOUNTER — Ambulatory Visit (INDEPENDENT_AMBULATORY_CARE_PROVIDER_SITE_OTHER): Payer: Managed Care, Other (non HMO) | Admitting: Family Medicine

## 2022-07-16 ENCOUNTER — Encounter (INDEPENDENT_AMBULATORY_CARE_PROVIDER_SITE_OTHER): Payer: Self-pay | Admitting: Family Medicine

## 2022-07-16 VITALS — BP 124/76 | HR 68 | Temp 97.6°F | Ht 66.0 in | Wt 238.0 lb

## 2022-07-16 DIAGNOSIS — R632 Polyphagia: Secondary | ICD-10-CM | POA: Diagnosis not present

## 2022-07-16 DIAGNOSIS — F3289 Other specified depressive episodes: Secondary | ICD-10-CM | POA: Diagnosis not present

## 2022-07-16 DIAGNOSIS — I10 Essential (primary) hypertension: Secondary | ICD-10-CM

## 2022-07-16 DIAGNOSIS — E559 Vitamin D deficiency, unspecified: Secondary | ICD-10-CM | POA: Diagnosis not present

## 2022-07-16 DIAGNOSIS — Z6838 Body mass index (BMI) 38.0-38.9, adult: Secondary | ICD-10-CM

## 2022-07-16 MED ORDER — LOMAIRA 8 MG PO TABS: ORAL_TABLET | ORAL | 0 refills | Status: DC

## 2022-07-16 MED ORDER — BUPROPION HCL ER (SR) 150 MG PO TB12: 150.0000 mg | ORAL_TABLET | Freq: Two times a day (BID) | ORAL | 0 refills | Status: DC

## 2022-07-16 MED ORDER — VITAMIN D (ERGOCALCIFEROL) 1.25 MG (50000 UNIT) PO CAPS: 50000.0000 [IU] | ORAL_CAPSULE | ORAL | 0 refills | Status: DC

## 2022-07-16 NOTE — Assessment & Plan Note (Signed)
Appetite has increased.  Reviewed her current nutritional intake including adequate intake of lean protein and fiber with meals.  Her increased hunger has stalled out her weight loss and she has a net weight loss of 14 pounds in 6 months of medically supervised weight management.  She has never used an antiobesity medication.  She is currently on topiramate ER for ICH and bupropion SR for emotional eating.  Continue bupropion SR 150 mg twice daily. Begin Lomaira 8 mg tab twice daily for appetite control and medically supervised weight management.  PDMP reviewed.  Blood pressure and heart rate are well-controlled.  Reviewed potential adverse side effects and will closely monitor blood pressure and heart rate.

## 2022-07-16 NOTE — Progress Notes (Signed)
Office: [phone]  /  Fax: [phone]  WEIGHT SUMMARY AND BIOMETRICS  Vitals Temp: 97.6 F (36.4 C) BP: 124/76 Pulse Rate: 68 SpO2: 99 %   Anthropometric Measurements Height: '5\' 6"'$  (1.676 m) Weight: 238 lb (108 kg) BMI (Calculated): 38.43 Weight at Last Visit: 237lb Weight Lost Since Last Visit: +1 Starting Weight: 252lb Total Weight Loss (lbs): 14 lb (6.35 kg)   Body Composition  Body Fat %: 47.6 % Fat Mass (lbs): 113.6 lbs Muscle Mass (lbs): 118.8 lbs Total Body Water (lbs): 89.4 lbs Visceral Fat Rating : 13   Other Clinical Data Fasting: no Labs: no Today's Visit #: 8 Starting Date: 12/27/21     HPI  Chief Complaint: OBESITY  Amber Cobb is here to discuss her progress with her obesity treatment plan. She is on the the Category 2 Plan and states she is following her eating plan approximately 85 % of the time. She states she is exercising 30 minutes 2 times per week.   Interval History:  Since last office visit she is up 1 pound This gives her a net weight loss of 14 pounds in 6 months of medically supervised weight management This is a 5.5% total body weight loss She is upset about hitting a plateau with her weight She has never used an antiobesity medication Her support system has been good She has added in some walking and plans to do more this spring. Hunger has increased.  Denies skipping meals.   Pharmacotherapy: Bupropion SR 150 mg twice daily  PHYSICAL EXAM:  Blood pressure 124/76, pulse 68, temperature 97.6 F (36.4 C), height '5\' 6"'$  (1.676 m), weight 238 lb (108 kg), SpO2 99 %. Body mass index is 38.41 kg/m.  General: She is overweight, cooperative, alert, well developed, and in no acute distress. PSYCH: Has normal mood, affect and thought process.   Lungs: Normal breathing effort, no conversational dyspnea. Gait normal  DIAGNOSTIC DATA REVIEWED:  BMET    Component Value Date/Time   NA 139 12/27/2021 1007   K 4.3 12/27/2021  1007   CL 104 12/27/2021 1007   CO2 18 (L) 12/27/2021 1007   GLUCOSE 91 12/27/2021 1007   GLUCOSE 137 (H) 06/15/2019 1437   BUN 11 12/27/2021 1007   CREATININE 0.83 12/27/2021 1007   CALCIUM 9.1 12/27/2021 1007   GFRNONAA >60 06/15/2019 1437   GFRAA >60 06/15/2019 1437   Lab Results  Component Value Date   HGBA1C 5.6 12/27/2021   Lab Results  Component Value Date   INSULIN 18.3 06/25/2022   INSULIN 22.4 12/27/2021   Lab Results  Component Value Date   TSH 1.280 12/27/2021   CBC    Component Value Date/Time   WBC 6.1 12/27/2021 1007   WBC 7.9 06/15/2019 1437   RBC 4.57 12/27/2021 1007   RBC 4.40 06/15/2019 1437   HGB 13.7 12/27/2021 1007   HCT 41.2 12/27/2021 1007   PLT 320 12/27/2021 1007   MCV 90 12/27/2021 1007   MCH 30.0 12/27/2021 1007   MCH 30.5 06/15/2019 1437   MCHC 33.3 12/27/2021 1007   MCHC 32.4 06/15/2019 1437   RDW 12.7 12/27/2021 1007   Iron Studies    Component Value Date/Time   IRON 85 12/27/2021 1007   TIBC 374 12/27/2021 1007   FERRITIN 98 12/27/2021 1007   IRONPCTSAT 23 12/27/2021 1007   Lipid Panel     Component Value Date/Time   CHOL 194 12/27/2021 1007   TRIG 299 (H) 12/27/2021 1007   HDL  39 (L) 12/27/2021 1007   CHOLHDL 5.0 (H) 12/27/2021 1007   LDLCALC 104 (H) 12/27/2021 1007   Hepatic Function Panel     Component Value Date/Time   PROT 7.5 12/27/2021 1007   ALBUMIN 4.4 12/27/2021 1007   AST 11 12/27/2021 1007   ALT 14 12/27/2021 1007   ALKPHOS 78 12/27/2021 1007   BILITOT 0.3 12/27/2021 1007      Component Value Date/Time   TSH 1.280 12/27/2021 1007   TSH 1.612 12/23/2020 1515   Nutritional Lab Results  Component Value Date   VD25OH 34.8 06/25/2022     ASSESSMENT AND PLAN  TREATMENT PLAN FOR OBESITY:  Recommended Dietary Goals  Dayeli is currently in the action stage of change. As such, her goal is to continue weight management plan. She has agreed to the Category 2 Plan.  Behavioral Intervention  We  discussed the following Behavioral Modification Strategies today: increasing lean protein intake, increasing vegetables, increasing fiber rich foods, increasing water intake, work on meal planning and easy cooking plans, and decreasing eating out, consumption of processed foods, and making healthy choices when eating convenient foods.  Additional resources provided today: NA  Recommended Physical Activity Goals  Virlee has been advised to work up to 150 minutes of moderate intensity aerobic activity a week and strengthening exercises 2-3 times per week for cardiovascular health, weight loss maintenance and preservation of muscle mass.   She has agreed to Will begin regular aerobic exercise 30 minutes, 3-4 times per week. Chosen activity walking.   Pharmacotherapy We discussed various medication options to help [place] with her weight loss efforts and we both agreed to [employer].  ASSOCIATED CONDITIONS ADDRESSED TODAY  Vitamin D deficiency Assessment & Plan: Last vitamin D Lab Results  Component Value Date   VD25OH 34.8 06/25/2022   She started prescription vitamin D 50,000 IU 1 time a week January 8.  Energy level is starting to improve.  Plan to recheck level in the next 3 months.  Target goal 50-70.    Orders: -     Vitamin D (Ergocalciferol); Take 1 capsule (50,000 Units total) by mouth every 7 (seven) days.  Dispense: 12 capsule; Refill: 0  Other depression with emotional eating -     buPROPion HCl ER (SR); Take 1 tablet (150 mg total) by mouth 2 (two) times daily.  Dispense: 180 tablet; Refill: 0  BMI 38.0-38.9,adult  Morbid obesity (HCC)  Benign essential HTN Assessment & Plan: Blood pressure is well-controlled today.  She is doing better on losartan 50 mg once daily.  This was reduced from 100 mg at her last visit and she remains off amlodipine.  She denies issues with dizziness.  Continue actively working on weight reduction.  Continue losartan 50 mg once  daily.   Polyphagia Assessment & Plan: Appetite has increased.  Reviewed her current nutritional intake including adequate intake of lean protein and fiber with meals.  Her increased hunger has stalled out her weight loss and she has a net weight loss of 14 pounds in 6 months of medically supervised weight management.  She has never used an antiobesity medication.  She is currently on topiramate ER for ICH and bupropion SR for emotional eating.  Continue bupropion SR 150 mg twice daily. Begin Lomaira 8 mg tab twice daily for appetite control and medically supervised weight management.  PDMP reviewed.  Blood pressure and heart rate are well-controlled.  Reviewed potential adverse side effects and will closely monitor blood pressure and heart rate.  Orders: -     Lomaira; 1 tab po 30 min before breakfast and 1 tab po at 2 pm daily  Dispense: 60 tablet; Refill: 0      No follow-ups on file.Marland Kitchen She was informed of the importance of frequent follow up visits to maximize her success with intensive lifestyle modifications for her multiple health conditions.   ATTESTASTION STATEMENTS:  Reviewed by clinician on day of visit: allergies, medications, problem list, medical history, surgical history, family history, social history, and previous encounter notes.   I have personally spent 30 minutes total time today in preparation, patient care, nutritional counseling and documentation for this visit, including the following: review of clinical lab tests; review of medical tests/procedures/services.      Dell Ponto, DO

## 2022-07-16 NOTE — Assessment & Plan Note (Signed)
Blood pressure is well-controlled today.  She is doing better on losartan 50 mg once daily.  This was reduced from 100 mg at her last visit and she remains off amlodipine.  She denies issues with dizziness.  Continue actively working on weight reduction.  Continue losartan 50 mg once daily.

## 2022-07-16 NOTE — Assessment & Plan Note (Signed)
Last vitamin D Lab Results  Component Value Date   VD25OH 34.8 06/25/2022   She started prescription vitamin D 50,000 IU 1 time a week January 8.  Energy level is starting to improve.  Plan to recheck level in the next 3 months.  Target goal 50-70.

## 2022-07-20 ENCOUNTER — Encounter: Payer: Self-pay | Admitting: Neurology

## 2022-07-26 ENCOUNTER — Ambulatory Visit (INDEPENDENT_AMBULATORY_CARE_PROVIDER_SITE_OTHER): Payer: Managed Care, Other (non HMO) | Admitting: Family Medicine

## 2022-08-27 ENCOUNTER — Ambulatory Visit (INDEPENDENT_AMBULATORY_CARE_PROVIDER_SITE_OTHER): Payer: Managed Care, Other (non HMO) | Admitting: Neurology

## 2022-08-27 ENCOUNTER — Ambulatory Visit (INDEPENDENT_AMBULATORY_CARE_PROVIDER_SITE_OTHER): Payer: Managed Care, Other (non HMO) | Admitting: Family Medicine

## 2022-08-27 ENCOUNTER — Encounter: Payer: Self-pay | Admitting: Neurology

## 2022-08-27 ENCOUNTER — Encounter (INDEPENDENT_AMBULATORY_CARE_PROVIDER_SITE_OTHER): Payer: Self-pay | Admitting: Family Medicine

## 2022-08-27 VITALS — BP 126/82 | HR 60 | Temp 97.6°F | Ht 66.0 in | Wt 235.0 lb

## 2022-08-27 DIAGNOSIS — R632 Polyphagia: Secondary | ICD-10-CM | POA: Diagnosis not present

## 2022-08-27 DIAGNOSIS — G932 Benign intracranial hypertension: Secondary | ICD-10-CM

## 2022-08-27 DIAGNOSIS — G43009 Migraine without aura, not intractable, without status migrainosus: Secondary | ICD-10-CM | POA: Diagnosis not present

## 2022-08-27 DIAGNOSIS — E559 Vitamin D deficiency, unspecified: Secondary | ICD-10-CM

## 2022-08-27 DIAGNOSIS — Z6838 Body mass index (BMI) 38.0-38.9, adult: Secondary | ICD-10-CM | POA: Diagnosis not present

## 2022-08-27 MED ORDER — BUTALBITAL-APAP-CAFFEINE 50-325-40 MG PO TABS: 1.0000 | ORAL_TABLET | ORAL | 0 refills | Status: DC | PRN

## 2022-08-27 MED ORDER — TOPIRAMATE ER 200 MG PO SPRINKLE CAP24: 200.0000 mg | EXTENDED_RELEASE_CAPSULE | Freq: Every evening | ORAL | 4 refills | Status: DC

## 2022-08-27 MED ORDER — VITAMIN D (ERGOCALCIFEROL) 1.25 MG (50000 UNIT) PO CAPS: 50000.0000 [IU] | ORAL_CAPSULE | ORAL | 0 refills | Status: DC

## 2022-08-27 MED ORDER — LOMAIRA 8 MG PO TABS: ORAL_TABLET | ORAL | 0 refills | Status: DC

## 2022-08-27 NOTE — Assessment & Plan Note (Signed)
Polyphasia somewhat improved on Lomaira 8 mg tab twice daily.  She does voice concerns about increased hunger.  On 24-hour verbal food recall, she is not adding in any snacks and appears to be under consuming her protein intake during the day which is likely worsening her hunger.  We discussed ways to increase protein and fiber with meals and snacks allowing 2 snacks per day to help with hunger.  Her blood pressure and heart rate are within normal limits on Lomaira 8 mg tab twice daily.  This does seem to be helping with weight reduction as she hit a plateau prior to starting.  Plan: Continue working on dietary changes as discussed during office visit increasing snacks while focusing on lean protein and fiber with meals and snacks.  Continue Lomaira 8 mg tab twice daily.

## 2022-08-27 NOTE — Assessment & Plan Note (Signed)
Last vitamin D Lab Results  Component Value Date   VD25OH 34.8 06/25/2022   She remains on prescription vitamin D 50,000 IU once weekly.  Target vitamin D level 50-70.  Energy level is starting to improve.  Continue vitamin D 50,000 IU once weekly.  Recheck level in 1 to 2 months.

## 2022-08-27 NOTE — Progress Notes (Signed)
[facility]    Provider:  Dr Lucia Gaskins Requesting Provider: Virl Diamond, MD Primary Care Provider:  Virl Diamond, MD  CC:  papilledema resolved on topiramate ER    09/06/2022: She has a follow up with Shanda Bumps for cpap, doing well. She has lost 17 pounds. Hecker's notes: reviewed 07/27/2022: OCT RNFL(91/97) improved since 04/2022(96/103) grade 1 edema OU improving, feeling better, only one headache during the storm and eclipse, all is going well. She took fioricet and it helped. Doing well on Topiramate ER. Daily headaches resolved had them for 1.5 years and now 1 headache a month maybe vision much improved. Last moderate to severe headache in 6 months.   Patient complains of symptoms per HPI as well as the following symptoms: none . Pertinent negatives and positives per HPI. All others negative  02/2022: cbc/cmp,tsh,hgba1c nml   Reviewed:  Recent Results (from the past 2160 hour(s))  VITAMIN D 25 Hydroxy (Vit-D Deficiency, Fractures)     Status: None   Collection Time: 06/25/22 10:49 AM  Result Value Ref Range   Vit D, 25-Hydroxy 34.8 30.0 - 100.0 ng/mL    Comment: Vitamin D deficiency has been defined by the Institute of Medicine and an Endocrine Society practice guideline as a level of serum 25-OH vitamin D less than 20 ng/mL (1,2). The Endocrine Society went on to further define vitamin D insufficiency as a level between 21 and 29 ng/mL (2). 1. IOM (Institute of Medicine). 2010. Dietary reference    intakes for calcium and D. [place]: The    [employer]. 2. Holick MF, Binkley Urbank, Bischoff-Ferrari HA, et al.    Evaluation, treatment, and prevention of vitamin D    deficiency: an Endocrine Society clinical practice    guideline. JCEM. 2011 Jul; 96(7):1911-30.   Insulin, random     Status: None   Collection Time: 06/25/22 10:49 AM  Result Value Ref Range   INSULIN 18.3 2.6 - 24.9 uIU/mL      Latest Ref Rng & Units 12/27/2021    10:07 AM 06/15/2019    2:37 PM  CMP  Glucose 70 - 99 mg/dL 91  540   BUN 6 - 24 mg/dL 11  19   Creatinine 9.81 - 1.00 mg/dL 1.91  4.78   Sodium 295 - 144 mmol/L 139  139   Potassium 3.5 - 5.2 mmol/L 4.3  3.5   Chloride 96 - 106 mmol/L 104  104   CO2 20 - 29 mmol/L 18  27   Calcium 8.7 - 10.2 mg/dL 9.1  8.9   Total Protein 6.0 - 8.5 g/dL 7.5  7.6   Total Bilirubin 0.0 - 1.2 mg/dL 0.3  0.6   Alkaline Phos 44 - 121 IU/L 78  66   AST 0 - 40 IU/L 11  16   ALT 0 - 32 IU/L 14  16       Latest Ref Rng & Units 12/27/2021   10:07 AM 06/15/2019    2:37 PM  CBC  WBC 3.4 - 10.8 x10E3/uL 6.1  7.9   Hemoglobin 11.1 - 15.9 g/dL 62.1  30.8   Hematocrit 34.0 - 46.6 % 41.2  41.3   Platelets 150 - 450 x10E3/uL 320  405      05/21/2022: Her prescription is better she saw hecker in December (I only have notes from 9/1) on 12/15 the ONH edema was improved per patient. On Topamax and doing well. She was diagnosed with mild OSA and is  using cpap. In 9/1 she had grade 2 papilledema. I need her notes from [facility] from December, will request. She I not having side effects on the extended release  trokendi. She has brain freeze headaches but not the all day every day headaches so headaches are better. No synope, sometimes a little dizziness on standing. Request hecker's notes. She sees Elmer Picker in March so we will see her after that. Mild numbness or tingling but that is more in the cold. May be Raynaud or just normal vasoconstriction.   Patient complains of symptoms per HPI as well as the following symptoms: losing weight . Pertinent negatives and positives per HPI. 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 Had sleep evaluation and is getting a sleep study in September. She is on Topamax  BID and she has been to the weight clinic and has lost 5 pounds too and the headache has eased up a bit. She has a lot more energy. Having fatigue with the  topiramate IR will change to ER and can take it all at night. She is seeing Elmer Picker eye care on the 1st again. She will contact me with the results and I'll get the notes. She was having daily headaches, doing "much better" or any in the last 2 weeks. MRI partially empty sella.   Patient complains of symptoms per HPI as well as the following symptoms: drowsiness due to medication . Pertinent negatives and positives per HPI. All others negative   HPI:  Louie Meaders is a 47 y.o. 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  I reviewed had her eye care and she was seen for headache, having headaches that feel like "a brain freeze" will hurt really bad for a minute then go away, she has been going on for almost a year, patient states that her right upper lid is drooping, she has a past medical history of acid reflux, anemia, migraines, anxiety, depression, hypertension.  Examination showed visual acuity OD 20/25 and OS 20/20, pupils equally round and reactive to light no APD, extraocular movements full, it did show optic nerve edema OU and a mild ptosis, headaches going on for a year, no other neurologic symptoms, worse with hard laughing or bowel movement, they ordered an MRI of the head and neck to rule out masses and tumors, no ophthalmoplegia, discussed that she may need an LP as well.     She has a low level headache every day for 1.5 years. With pressure she gets worse symptoms of headaches, low level headache every day, mostly in the forehead, she snores heavily, headaches every day, she has a film over her eyes, she cannot lose weight, she has migraines those are different and not often, she started having migraines about 4 years ago and she was waking up with them when she gets a migraine pulsating, pounding, light and sound sensitivity and nausea but the daily headaches are different. She feels  pressure in ears, hears swishing in the ears. She has gained weight. No energy. She naps every day, 3x a day, daily headaches are mild to moderate, migraines can be severe 1-2x a week, ibuprofen. Ibuprofen helps. Migraines for 4 years. She is not interested in this time with a triptan, ibuprofen helps, discussed rebound headache with ibuprofen.No other focal neurologic deficits, associated symptoms, inciting events or modifiable factors.  Reviewed notes, labs and imaging  from outside physicians, which showed:  CT angio of the neck: Showed normal vasculature of the neck, the right vertebral artery is diminutive and nondominant, normal variant, likely accounting for the findings on the prior MRI.  MRI of the neck soft tissue: Showed abnormal appearance of the right vertebral artery, neck CT was recommended as above and was unremarkable.  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 01/2021: tsh,cbc,cmp normal  Review of Systems: Patient complains of symptoms per HPI as well as the following symptoms headaches. Pertinent negatives and positives per HPI. All others negative.   Social History   Socioeconomic History   Marital status: Married    Spouse name: Timothy   Number of children: 2   Years of education: Not on file   Highest education level: Associate degree: occupational, Scientist, product/process development, or vocational program  Occupational History   Not on file  Tobacco Use   Smoking status: Never   Smokeless tobacco: Never  Vaping Use   Vaping Use: Never used  Substance and Sexual Activity   Alcohol use: Not Currently   Drug use: Never   Sexual activity: Yes  Other Topics Concern   Not on file  Social History Narrative   Lives at home with husband and children   R handed   Caffeine: none   Social Determinants of Health   Financial Resource Strain: Not on  file  Food Insecurity: Not on file  Transportation Needs: Not on file  Physical Activity: Not on file  Stress: Not on file  Social Connections: Not on file  Intimate Partner Violence: Not on file    Family History  Problem Relation Age of Onset   Obesity Mother    Anxiety disorder Mother    Depression Mother    Hypertension Mother    Diabetes Mother    COPD Mother    Thyroid disease Mother    Migraines Mother    High Cholesterol Mother    Sleep apnea Mother    Obesity Father    Thyroid disease Sister    Migraines Sister    Thyroid disease Brother    Endometrial cancer Maternal Aunt    Lung cancer Maternal Uncle    Throat cancer Maternal Grandfather    Colon cancer Neg Hx    Esophageal cancer Neg Hx    Pancreatic cancer Neg Hx    Colon polyps Neg Hx     Past Medical History:  Diagnosis Date   Anemia    Anxiety    Back pain    Carpal tunnel syndrome    Complex atypical endometrial hyperplasia    Depression    Diverticulosis    Edema of both lower extremities    Endometrial cancer    Gallbladder disorder    Gallstones    Generalized anxiety disorder    GERD (gastroesophageal reflux disease)    History of kidney stones    Hypertension    Joint pain    Migraines    Obesity    UTI (urinary tract infection)     Patient Active Problem List   Diagnosis Date Noted   Polyphagia 07/16/2022   Morbid obesity 06/25/2022   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   OSA (obstructive sleep apnea) 02/08/2022   Benign essential HTN 02/08/2022   Sleep-disordered breathing 01/10/2022   Vitamin D deficiency 01/10/2022   Insulin resistance 01/10/2022   Depression 01/10/2022  Metabolic syndrome 01/02/2022   Excessive daytime sleepiness 12/19/2021   Chronic fatigue 12/19/2021   Snoring 12/19/2021   IIH (idiopathic intracranial hypertension) 11/06/2021   Chronic daily headache 11/06/2021   Endometrial cancer 06/22/2019   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    Past Surgical History:  Procedure Laterality Date   CARPAL TUNNEL RELEASE Bilateral    CHOLECYSTECTOMY     ENDOMETRIAL BIOPSY  05/05/2019   ROBOTIC ASSISTED TOTAL HYSTERECTOMY WITH BILATERAL SALPINGO OOPHERECTOMY N/A 06/17/2019   Procedure: XI ROBOTIC ASSISTED TOTAL HYSTERECTOMY WITH BILATERAL SALPINGECTOMY AND  LYMPH NODE DISSECTION;  Surgeon: Carver Fila, MD;  Location: [facility] [place];  Service: Gynecology;  Laterality: N/A;   SENTINEL NODE BIOPSY N/A 06/17/2019   Procedure: SENTINEL NODE BIOPSY;  Surgeon: Carver Fila, MD;  Location: [facility];  Service: Gynecology;  Laterality: N/A;    Current Outpatient Medications  Medication Sig Dispense Refill   buPROPion (WELLBUTRIN SR) 150 MG 12 hr tablet Take 1 tablet (150 mg total) by mouth 2 (two) times daily. 180 tablet 0   escitalopram (LEXAPRO) 10 MG tablet Take 10 mg by mouth daily.     hydrocortisone (ANUSOL-HC) 25 MG suppository Place 1 suppository (25 mg total) rectally every 12 (twelve) hours. 12 suppository 1   loratadine (CLARITIN) 10 MG tablet Take 10 mg by mouth daily.     losartan (COZAAR) 50 MG tablet Take 1 tablet (50 mg total) by mouth daily. 90 tablet 0   Melatonin 5 MG CAPS Take by mouth.     modafinil (PROVIGIL) 200 MG tablet Take 1 tablet (200 mg total) by mouth daily. 30 tablet 2   Multiple Vitamin (MULTIVITAMIN) capsule Take 1 capsule by mouth daily.     omeprazole (PRILOSEC) 40 MG capsule Take 40 mg by mouth daily as needed.     Phentermine HCl (LOMAIRA) 8 MG TABS 1 tab po 30 min before breakfast and 1 tab po at 2 pm daily 60 tablet 0   Vitamin D, Ergocalciferol, (DRISDOL) 1.25 MG (50000 UNIT) CAPS capsule Take 1 capsule (50,000 Units total) by mouth every 7 (seven) days. 12 capsule 0   butalbital-acetaminophen-caffeine (FIORICET) 50-325-40 MG tablet Take 1 tablet by mouth every 4 (four) hours as needed for headache. 14 tablet 0   topiramate ER  (QUDEXY XR) 200 MG CS24 sprinkle capsule Take 1 capsule (200 mg total) by mouth at bedtime. 90 capsule 4   No current facility-administered medications for this visit.    Allergies as of 08/27/2022 - Review Complete 08/27/2022  Allergen Reaction Noted   Chloraprep one step [chlorhexidine gluconate] Hives 07/08/2019    Vitals: BP 111/74   Pulse (!) 55   Ht 5\' 6"  (1.676 m)   Wt 237 lb (107.5 kg)   BMI 38.25 kg/m  Last Weight:  Wt Readings from Last 1 Encounters:  08/27/22 237 lb (107.5 kg)   Last Height:   Ht Readings from Last 1 Encounters:  08/27/22 5\' 6"  (1.676 m)     Exam: NAD, pleasant                  Speech:    Speech is normal; fluent and spontaneous with normal comprehension.  Cognition:    The patient is oriented to person, place, and time;     recent and remote memory intact;     language fluent;    Cranial Nerves:    The pupils are equal, round, and reactive to light. Left appears normal,  right still with some mild blurring of ONH. Trigeminal sensation is intact and the muscles of mastication are normal. The face is symmetric. The palate elevates in the midline. Hearing intact. Voice is normal. Shoulder shrug is normal. The tongue has normal motion without fasciculations.   Coordination:  No dysmetria  Motor Observation:    No asymmetry, no atrophy, and no involuntary movements noted. Tone:    Normal muscle tone.     Strength:    Strength is V/V in the upper and lower limbs.      Sensation: intact to LT     Assessment/Plan:  Patient with chronic daily headaches, optic nerve head edema and migraines. MRI brain/CTA/CTV head unremarkable(MRI partially empty sella. ), opening pressure 26, doing much better, intolerance to Topiramate IR changed to ER. She had a hysterectomy.   -Opening pressure was 26 consistent with IDIOPATHIC INTRACRANIAL HYPERTENSION.  - Had sleep evaluation mild OSA on a cpap doing well - She is on Trokendi 200mg  continue edema  improving  - repeat eye exam in march we will see her after that (Hecker's notes: reviewed 07/27/2022: OCT RNFL(91/97) improved since 04/2022(96/103) grade 1 edema OU improving, feeling better, only one headache during the storm and eclipse, all is going well. She took fioricet and it helped. Doing well on Topiramate ER. ) -Weight loss is critical, discussed weight loss and risk of permanent vision loss, seeing healthy weight and wellness - CTV for venous thrombosis  No evidence of intracranial venous thrombosis,  No convincing dural venous sinus stenosis. - We will continue her medication as is, appears last OCT RNFL normal - hasn't taken a fioricet in 6 months, we can give her 14 tabs she doesn't use often at all   Hecker's notes: reviewed 07/27/2022: IMPROVED grade 1 papilledema. OCT RNFL(91/97) improved since 04/2022(96/103) grade 1 edema OU improving, feeling better, only one headache during the storm and eclipse, all is going well. She took fioricet and it helped. Doing well on Topiramate ER. )  I reviewed notes from [facility] from January 12, 2022, at that time improved, [place] OD 2020 and OS 2020 at that time she had just started Topamax and her headaches were worse OCT RNFL and exam showed worsening papilledema OS greater than OD, and she had not had her lumbar puncture yet.  I called Hecker's office and received more recent notes from January 26, 2022.,  At that time distance was still 2020 OD and OS corrected, headaches are improved she does get some brain freeze headaches when it is cold, on exam pupils are equally round and reactive to light, there was no APD, extraocular movements were full, normal adnexa, anterior chamber was deep and quiet, posterior examination showed no hemorrhages no holes or tears, she still had grade 1 nerve swelling OD and grade 1 nerve swelling OS, no ptosis was noted on this exam and no anisocoria was noted stated that pupils were equally round and  reactive to light so I am less inclined to keep Horner syndrome in the differential.  We will continue her medication as is, they will recheck dilated exam in 3 to 4 months with fundi photos, at that time if no improvement will need to check topiramate levels, ensure compliance, and possibly discuss either increase in topiramate or changing management.  Meds ordered this encounter  Medications   topiramate ER (QUDEXY XR) 200 MG CS24 sprinkle capsule    Sig: Take 1 capsule (200 mg total) by mouth at bedtime.  Dispense:  90 capsule    Refill:  4    Cannot tolerate IR drowsiness. Can be generic   butalbital-acetaminophen-caffeine (FIORICET) 50-325-40 MG tablet    Sig: Take 1 tablet by mouth every 4 (four) hours as needed for headache.    Dispense:  14 tablet    Refill:  0     For any acute change especially worsening headache or vision loss call 911 and proceed to ED  To prevent or relieve headaches, try the following: Cool Compress. Lie down and place a cool compress on your head.  Avoid headache triggers. If certain foods or odors seem to have triggered your migraines in the past, avoid them. A headache diary might help you identify triggers.  Include physical activity in your daily routine. Try a daily walk or other moderate aerobic exercise.  Manage stress. Find healthy ways to cope with the stressors, such as delegating tasks on your to-do list.  Practice relaxation techniques. Try deep breathing, yoga, massage and visualization.  Eat regularly. Eating regularly scheduled meals and maintaining a healthy diet might help prevent headaches. Also, drink plenty of fluids.  Follow a regular sleep schedule. Sleep deprivation might contribute to headaches Consider biofeedback. With this mind-body technique, you learn to control certain bodily functions -- such as muscle tension, heart rate and blood pressure -- to prevent headaches or reduce headache pain.    Proceed to emergency room if you  experience new or worsening symptoms or symptoms do not resolve, if you have new neurologic symptoms or if headache is severe, or for any concerning symptom.   Provided education and documentation from American headache Society toolbox including articles on: pseudotumoer cerebri(IIH), chronic migraine medication overuse headache, chronic migraines, prevention of migraines and other headaches, behavioral and other nonpharmacologic treatments for headache.  Meds ordered this encounter  Medications   topiramate ER (QUDEXY XR) 200 MG CS24 sprinkle capsule    Sig: Take 1 capsule (200 mg total) by mouth at bedtime.    Dispense:  90 capsule    Refill:  4    Cannot tolerate IR drowsiness. Can be generic   butalbital-acetaminophen-caffeine (FIORICET) 50-325-40 MG tablet    Sig: Take 1 tablet by mouth every 4 (four) hours as needed for headache.    Dispense:  14 tablet    Refill:  0     Cc: Pallone, Franki Cabot, MD,  Pallone, Franki Cabot, MD  Naomie Dean, MD  [facility] [address] [place], [place] [address]  Phone [phone] Fax [phone]  I spent 30 minutes of face-to-face and non-face-to-face time with patient on the  1. IIH (idiopathic intracranial hypertension)   2. Migraine without aura and without status migrainosus, not intractable    diagnosis.  This included previsit chart review, lab review, study review, order entry, electronic health record documentation, patient education on the different diagnostic and therapeutic options, counseling and coordination of care, risks and benefits of management, compliance, or risk factor reduction

## 2022-08-27 NOTE — Progress Notes (Signed)
Office: [phone]  /  Fax: [phone]  WEIGHT SUMMARY AND BIOMETRICS  Starting Date: 12/27/21  Starting Weight: 252lb   Weight Lost Since Last Visit: 3lb   Vitals Temp: 97.6 F (36.4 C) BP: 111/74 Pulse Rate: (!) 55 SpO2: 99 %   Body Composition  Body Fat %: 47 % Fat Mass (lbs): 110.6 lbs Muscle Mass (lbs): 118.4 lbs Total Body Water (lbs): 88.2 lbs Visceral Fat Rating : 13   HPI  Chief Complaint: OBESITY  Amber Cobb is here to discuss her progress with her obesity treatment plan. She is on the the Category 2 Plan and states she is following her eating plan approximately 90 % of the time. She states she is exercising 30 minutes 3 times per week.   Interval History:  Since last office visit she is down 3 lb She is down down 17 lb in the past 8 mos This is a 6.7% total body weight loss. She did start Lomaira 8 mg tab bid without adverse side effect She has felt hungrier She is not skipping meals.  She is not adding in any snacks.  She has her biggest meal at dinner She using the WalkFit Ap and has increased her steps towards 10,000 steps/ day  Pharmacotherapy: Lomaira 8 mg bid   PHYSICAL EXAM:  Blood pressure 126/82, pulse 60, temperature 97.6 F (36.4 C), height 5\' 6"  (1.676 m), weight 235 lb (106.6 kg), SpO2 99 %. Body mass index is 37.93 kg/m.  General: She is overweight, cooperative, alert, well developed, and in no acute distress. PSYCH: Has normal mood, affect and thought process.   Lungs: Normal breathing effort, no conversational dyspnea.   ASSESSMENT AND PLAN  TREATMENT PLAN FOR OBESITY:  Recommended Dietary Goals  Aliena is currently in the action stage of change. As such, her goal is to continue weight management plan. She has agreed to the Category 2 Plan.  Behavioral Intervention  We discussed the following Behavioral Modification Strategies today: increasing lean protein intake, decreasing simple carbohydrates , increasing  vegetables, increasing lower glycemic fruits, increasing fiber rich foods, avoiding skipping meals, increasing water intake, work on meal planning and preparation, reading food labels , planning for success, and better snacking choices.  Additional resources provided today: NA  Recommended Physical Activity Goals  Gaylon has been advised to work up to 150 minutes of moderate intensity aerobic activity a week and strengthening exercises 2-3 times per week for cardiovascular health, weight loss maintenance and preservation of muscle mass.   She has agreed to Think about ways to increase physical activity continue use of walk fit app aiming for 10,000 steps daily  Pharmacotherapy changes for the treatment of obesity: Lomaira 8 mg bid  ASSOCIATED CONDITIONS ADDRESSED TODAY  Polyphagia Assessment & Plan: Polyphasia somewhat improved on Lomaira 8 mg tab twice daily.  She does voice concerns about increased hunger.  On 24-hour verbal food recall, she is not adding in any snacks and appears to be under consuming her protein intake during the day which is likely worsening her hunger.  We discussed ways to increase protein and fiber with meals and snacks allowing 2 snacks per day to help with hunger.  Her blood pressure and heart rate are within normal limits on Lomaira 8 mg tab twice daily.  This does seem to be helping with weight reduction as she hit a plateau prior to starting.  Plan: Continue working on dietary changes as discussed during office visit increasing snacks while focusing on lean protein  and fiber with meals and snacks.  Continue Lomaira 8 mg tab twice daily.  Orders: -     Lomaira; 1 tab po 30 min before breakfast and 1 tab po at 2 pm daily  Dispense: 60 tablet; Refill: 0  Vitamin D deficiency Assessment & Plan: Last vitamin D Lab Results  Component Value Date   VD25OH 34.8 06/25/2022   She remains on prescription vitamin D 50,000 IU once weekly.  Target vitamin D level  50-70.  Energy level is starting to improve.  Continue vitamin D 50,000 IU once weekly.  Recheck level in 1 to 2 months.  Orders: -     Vitamin D (Ergocalciferol); Take 1 capsule (50,000 Units total) by mouth every 7 (seven) days.  Dispense: 12 capsule; Refill: 0  Morbid obesity  BMI 38.0-38.9,adult      She was informed of the importance of frequent follow up visits to maximize her success with intensive lifestyle modifications for her multiple health conditions.   ATTESTASTION STATEMENTS:  Reviewed by clinician on day of visit: allergies, medications, problem list, medical history, surgical history, family history, social history, and previous encounter notes pertinent to obesity diagnosis.   I have personally spent 30 minutes total time today in preparation, patient care, nutritional counseling and documentation for this visit, including the following: review of clinical lab tests; review of medical tests/procedures/services.      Glennis Brink, DO DABFM, DABOM [facility] [address] [place], [place] [address] [phone]

## 2022-09-03 ENCOUNTER — Ambulatory Visit: Payer: 59 | Admitting: Neurology

## 2022-09-24 ENCOUNTER — Telehealth: Payer: Self-pay | Admitting: Neurology

## 2022-09-24 ENCOUNTER — Encounter (INDEPENDENT_AMBULATORY_CARE_PROVIDER_SITE_OTHER): Payer: Self-pay | Admitting: Family Medicine

## 2022-09-24 ENCOUNTER — Ambulatory Visit (INDEPENDENT_AMBULATORY_CARE_PROVIDER_SITE_OTHER): Payer: Managed Care, Other (non HMO) | Admitting: Family Medicine

## 2022-09-24 VITALS — BP 123/79 | HR 65 | Temp 98.0°F | Ht 66.0 in | Wt 235.0 lb

## 2022-09-24 DIAGNOSIS — I10 Essential (primary) hypertension: Secondary | ICD-10-CM

## 2022-09-24 DIAGNOSIS — E88819 Insulin resistance, unspecified: Secondary | ICD-10-CM | POA: Diagnosis not present

## 2022-09-24 DIAGNOSIS — Z6837 Body mass index (BMI) 37.0-37.9, adult: Secondary | ICD-10-CM

## 2022-09-24 MED ORDER — METFORMIN HCL ER 500 MG PO TB24
500.0000 mg | ORAL_TABLET | Freq: Every day | ORAL | 0 refills | Status: DC
Start: 2022-09-24 — End: 2022-10-30

## 2022-09-24 MED ORDER — LOSARTAN POTASSIUM 50 MG PO TABS: 50.0000 mg | ORAL_TABLET | Freq: Every day | ORAL | 0 refills | Status: DC

## 2022-09-24 NOTE — Assessment & Plan Note (Signed)
Fasting insulin elevated 22.4--> 18.3, last drawn 06/25/22 She has never been on metformin She is craving more sweets She is more active on days off work but has a sedentary job during the week  Begin metformin XR 500 mg once daily with food for IR while continuing to work on reducing intake of added sugar and refined carbs.  Recommend adding in 30 min of walking in the evenings after work.  Repeat CMP, fasting insulin, B12 next visit

## 2022-09-24 NOTE — Progress Notes (Signed)
Office: [phone]  /  Fax: [phone]  WEIGHT SUMMARY AND BIOMETRICS  Starting Date: 12/27/21  Starting Weight: 252 lb   Weight Lost Since Last Visit: 0 lb   Vitals Temp: 98 F (36.7 C) BP: 123/79 Pulse Rate: 65 SpO2: 99 %   Body Composition  Body Fat %: 43.2 % Fat Mass (lbs): 101 lbs Muscle Mass (lbs): 101.8 lbs Total Body Water (lbs): 89.2 lbs Visceral Fat Rating : 12     HPI  Chief Complaint: OBESITY  Amber Cobb is here to discuss her progress with her obesity treatment plan. She is on the the Category 2 Plan and states she is following her eating plan approximately 85 % of the time. She states she is exercising 30 minutes 3 times per week.   Interval History:  Since last office visit she is down 0 lb She has been doing more emotional eating, eating ice cream and drinking milkshakes for a sore throat She is down a net 17 lb in the past 9 mo She is still motivated to get to a lower weight She feels better control of emotional eating on Wellbutrin SR 150 mg bid She has been taking Lomaira 8 mg bid She has been doing more physical activity on days off Stress levels have been high  Pharmacotherapy: Lomaira  PHYSICAL EXAM:  Blood pressure 123/79, pulse 65, temperature 98 F (36.7 C), height 5\' 6"  (1.676 m), weight 235 lb (106.6 kg), SpO2 99 %. Body mass index is 37.93 kg/m.  General: She is overweight, cooperative, alert, well developed, and in no acute distress. PSYCH: Has normal mood, affect and thought process.   Lungs: Normal breathing effort, no conversational dyspnea.   ASSESSMENT AND PLAN  TREATMENT PLAN FOR OBESITY:  Recommended Dietary Goals  Amber Cobb is currently in the action stage of change. As such, her goal is to continue weight management plan. She has agreed to the Category 2 Plan.  Behavioral Intervention  We discussed the following Behavioral Modification Strategies today: increasing lean protein intake, decreasing simple  carbohydrates , increasing vegetables, increasing lower glycemic fruits, increasing water intake, keeping healthy foods at home, decreasing eating out or consumption of processed foods, and making healthy choices when eating convenient foods, work on managing stress, creating time for self-care and relaxation measures, avoiding temptations and identifying enticing environmental cues, continue to practice mindfulness when eating, and planning for success.  Additional resources provided today: NA  Recommended Physical Activity Goals  Success has been advised to work up to 150 minutes of moderate intensity aerobic activity a week and strengthening exercises 2-3 times per week for cardiovascular health, weight loss maintenance and preservation of muscle mass.   She has agreed to Increase the intensity, frequency or duration of aerobic exercises    Pharmacotherapy changes for the treatment of obesity:  discontinue Lomaira due to lack of weight loss  ASSOCIATED CONDITIONS ADDRESSED TODAY  Insulin resistance Assessment & Plan: Fasting insulin elevated 22.4--> 18.3, last drawn 06/25/22 She has never been on metformin She is craving more sweets She is more active on days off work but has a sedentary job during the week  Begin metformin XR 500 mg once daily with food for IR while continuing to work on reducing intake of added sugar and refined carbs.  Recommend adding in 30 min of walking in the evenings after work.  Repeat CMP, fasting insulin, B12 next visit  Orders: -     metFORMIN HCl ER; Take 1 tablet (500 mg total) by  mouth daily with breakfast.  Dispense: 30 tablet; Refill: 0  Essential hypertension -     Losartan Potassium; Take 1 tablet (50 mg total) by mouth daily.  Dispense: 90 tablet; Refill: 0  Morbid obesity (HCC) wtih starting BMI 40  BMI 37.0-37.9, adult  Benign essential HTN Assessment & Plan: BP well controlled on Losartan 50 mg once daily Denies adverse Ses Actively  working on weight loss Down 6.7% TBW in the past 9 mos  Continue Losartan 50 mg daily       She was informed of the importance of frequent follow up visits to maximize her success with intensive lifestyle modifications for her multiple health conditions.   ATTESTASTION STATEMENTS:  Reviewed by clinician on day of visit: allergies, medications, problem list, medical history, surgical history, family history, social history, and previous encounter notes pertinent to obesity diagnosis.   I have personally spent 30 minutes total time today in preparation, patient care, nutritional counseling and documentation for this visit, including the following: review of clinical lab tests; review of medical tests/procedures/services.      Glennis Brink, DO DABFM, DABOM [facility] [address] [place], [place] [address] [phone]

## 2022-09-24 NOTE — Assessment & Plan Note (Signed)
BP well controlled on Losartan 50 mg once daily Denies adverse Ses Actively working on weight loss Down 6.7% TBW in the past 9 mos  Continue Losartan 50 mg daily

## 2022-09-24 NOTE — Telephone Encounter (Signed)
Sent mychart msg informing pt of appt change due to provider schedule change 

## 2022-10-01 ENCOUNTER — Encounter: Payer: Self-pay | Admitting: Gynecologic Oncology

## 2022-10-15 ENCOUNTER — Ambulatory Visit (INDEPENDENT_AMBULATORY_CARE_PROVIDER_SITE_OTHER): Payer: Managed Care, Other (non HMO) | Admitting: Adult Health

## 2022-10-15 ENCOUNTER — Encounter (INDEPENDENT_AMBULATORY_CARE_PROVIDER_SITE_OTHER): Payer: Self-pay | Admitting: Adult Health

## 2022-10-15 DIAGNOSIS — Z6837 Body mass index (BMI) 37.0-37.9, adult: Secondary | ICD-10-CM

## 2022-10-15 DIAGNOSIS — E88819 Insulin resistance, unspecified: Secondary | ICD-10-CM | POA: Diagnosis not present

## 2022-10-15 DIAGNOSIS — I10 Essential (primary) hypertension: Secondary | ICD-10-CM | POA: Diagnosis not present

## 2022-10-15 DIAGNOSIS — E559 Vitamin D deficiency, unspecified: Secondary | ICD-10-CM | POA: Diagnosis not present

## 2022-10-15 DIAGNOSIS — E669 Obesity, unspecified: Secondary | ICD-10-CM

## 2022-10-15 NOTE — Progress Notes (Signed)
WEIGHT SUMMARY AND BIOMETRICS  Vitals Temp: 98.1 F (36.7 C) BP: 113/75 Pulse Rate: 65 SpO2: 99 %   Anthropometric Measurements Height: 5\' 6"  (1.676 m) Weight: 235 lb (106.6 kg) BMI (Calculated): 37.95 Weight at Last Visit: 235lb Weight Lost Since Last Visit: 0 Weight Gained Since Last Visit: 0 Starting Weight: 252lb Total Weight Loss (lbs): 17 lb (7.711 kg)   Body Composition  Body Fat %: 46.5 % Fat Mass (lbs): 109.6 lbs Muscle Mass (lbs): 119.6 lbs Total Body Water (lbs): 88.8 lbs Visceral Fat Rating : 12   Other Clinical Data Fasting: yes Labs: no Today's Visit #: 12 Starting Date: 12/27/21    Chief Complaint:   OBESITY Amber Cobb is here to discuss her progress with her obesity treatment plan. She is on the the Category 2 Plan and states she is following her eating plan approximately 90 % of the time. She states she is exercising Walking 30 minutes 3-4 times per week.   Interim History:  Started on Metformin XR 500mg  on 09/24/2022 She takes each morning- denies GI upset. She reports decrease in polyphagia.  Hunger/appetite-well controlled with proper nutrition, walking, and morning dose of Metformin XR 500mg   Sleep- she estimates to sleep 7-8 SOUND hrs/night  Exercise-she is walking at least 30 mins briskly 3-4 times per week.  Hydration-she estimates to drink >90 oz water/day   Of note- She has a local 21 yr old son with 2 grandchildren age 13 and 4 months She has 54 yr old daughter- lives in [place] N[facility] (infant) RN  Subjective:   1. Essential hypertension BP excellent at OV She is on Losartan 50mg   Amlodipine d/c'd due to   2. Insulin resistance  Latest Reference Range & Units 12/27/21 10:07 06/25/22 10:49  INSULIN 2.6 - 24.9 uIU/mL 22.4 18.3  Started on Metformin XR 500mg  on 09/24/2022 She takes each morning- denies GI upset. She reports decrease in polyphagia. Per pt- her mother is diabetic and her 59 year old sister was recently  dx's with diabetes  3. Vitamin D deficiency  Latest Reference Range & Units 06/25/22 10:49  Vitamin D, 25-Hydroxy 30.0 - 100.0 ng/mL 34.8  She is on weekly Ergocalciferol- denies N/V/Muscle Weakness  Assessment/Plan:   1. Essential hypertension Check Labs - Comprehensive metabolic panel  2. Insulin resistance Check Labs - Vitamin B12 - Insulin, random  3. Vitamin D deficiency Check Labs - VITAMIN D 25 Hydroxy (Vit-D Deficiency, Fractures)  4. Current BMI 37.95  Amber Cobb is currently in the action stage of change. As such, her goal is to continue with weight loss efforts. She has Cobb to the Category 2 Plan.   Exercise goals: For substantial health benefits, adults should do at least 150 minutes (2 hours and 30 minutes) a week of moderate-intensity, or 75 minutes (1 hour and 15 minutes) a week of vigorous-intensity aerobic physical activity, or an equivalent combination of moderate- and vigorous-intensity aerobic activity. Aerobic activity should be performed in episodes of at least 10 minutes, and preferably, it should be spread throughout the week.  Behavioral modification strategies: increasing lean protein intake, decreasing simple carbohydrates, increasing vegetables, increasing water intake, no skipping meals, meal planning and cooking strategies, and planning for success.  Amber Cobb has Cobb to follow-up with our clinic in 3 weeks. She was informed of the importance of frequent follow-up visits to maximize her success with intensive lifestyle modifications for her multiple health conditions.   Amber Cobb was informed we would discuss her lab results at  her next visit unless there is a critical issue that needs to be addressed sooner. Amber Cobb to keep her next visit at the Cobb upon time to discuss these results.  Objective:   Blood pressure 113/75, pulse 65, temperature 98.1 F (36.7 C), height 5\' 6"  (1.676 m), weight 235 lb (106.6 kg), SpO2 99 %. Body mass index  is 37.93 kg/m.  General: Cooperative, alert, well developed, in no acute distress. HEENT: Conjunctivae and lids unremarkable. Cardiovascular: Regular rhythm.  Lungs: Normal work of breathing. Neurologic: No focal deficits.   Lab Results  Component Value Date   CREATININE 0.83 12/27/2021   BUN 11 12/27/2021   NA 139 12/27/2021   K 4.3 12/27/2021   CL 104 12/27/2021   CO2 18 (L) 12/27/2021   Lab Results  Component Value Date   ALT 14 12/27/2021   AST 11 12/27/2021   ALKPHOS 78 12/27/2021   BILITOT 0.3 12/27/2021   Lab Results  Component Value Date   HGBA1C 5.6 12/27/2021   Lab Results  Component Value Date   INSULIN 18.3 06/25/2022   INSULIN 22.4 12/27/2021   Lab Results  Component Value Date   TSH 1.280 12/27/2021   Lab Results  Component Value Date   CHOL 194 12/27/2021   HDL 39 (L) 12/27/2021   LDLCALC 104 (H) 12/27/2021   TRIG 299 (H) 12/27/2021   CHOLHDL 5.0 (H) 12/27/2021   Lab Results  Component Value Date   VD25OH 34.8 06/25/2022   Lab Results  Component Value Date   WBC 6.1 12/27/2021   HGB 13.7 12/27/2021   HCT 41.2 12/27/2021   MCV 90 12/27/2021   PLT 320 12/27/2021   Lab Results  Component Value Date   IRON 85 12/27/2021   TIBC 374 12/27/2021   FERRITIN 98 12/27/2021   Attestation Statements:   Reviewed by clinician on day of visit: allergies, medications, problem list, medical history, surgical history, family history, social history, and previous encounter notes.  I have reviewed the above documentation for accuracy and completeness, and I agree with the above. -  Sincerity [place] Sherr, NP-C

## 2022-10-16 LAB — COMPREHENSIVE METABOLIC PANEL
AST: 13 IU/L (ref 0–40)
Albumin: 4.3 g/dL (ref 3.9–4.9)
BUN/Creatinine Ratio: 15 (ref 9–23)
Calcium: 9.1 mg/dL (ref 8.7–10.2)
Chloride: 105 mmol/L (ref 96–106)
Glucose: 96 mg/dL (ref 70–99)
eGFR: 73 mL/min/{1.73_m2} (ref >59)

## 2022-10-16 LAB — INSULIN, RANDOM

## 2022-10-16 LAB — VITAMIN B12: Vitamin B-12: 585 pg/mL (ref 232–1245)

## 2022-10-17 LAB — COMPREHENSIVE METABOLIC PANEL
ALT: 14 IU/L (ref 0–32)
Albumin/Globulin Ratio: 1.6 (ref 1.2–2.2)
Alkaline Phosphatase: 77 IU/L (ref 44–121)
BUN: 15 mg/dL (ref 6–24)
Bilirubin Total: 0.5 mg/dL (ref 0.0–1.2)
CO2: 19 mmol/L — ABNORMAL LOW (ref 20–29)
Creatinine, Ser: 0.98 mg/dL (ref 0.57–1.00)
Globulin, Total: 2.7 g/dL (ref 1.5–4.5)
Potassium: 4.4 mmol/L (ref 3.5–5.2)
Sodium: 139 mmol/L (ref 134–144)
Total Protein: 7 g/dL (ref 6.0–8.5)

## 2022-10-17 LAB — VITAMIN D 25 HYDROXY (VIT D DEFICIENCY, FRACTURES): Vit D, 25-Hydroxy: 42.5 ng/mL (ref 30.0–100.0)

## 2022-10-30 ENCOUNTER — Other Ambulatory Visit (INDEPENDENT_AMBULATORY_CARE_PROVIDER_SITE_OTHER): Payer: Self-pay | Admitting: Adult Health

## 2022-10-30 ENCOUNTER — Encounter (INDEPENDENT_AMBULATORY_CARE_PROVIDER_SITE_OTHER): Payer: Self-pay | Admitting: Adult Health

## 2022-10-30 ENCOUNTER — Encounter (INDEPENDENT_AMBULATORY_CARE_PROVIDER_SITE_OTHER): Payer: Self-pay

## 2022-10-30 DIAGNOSIS — E88819 Insulin resistance, unspecified: Secondary | ICD-10-CM

## 2022-10-30 MED ORDER — METFORMIN HCL ER 500 MG PO TB24: 500.0000 mg | ORAL_TABLET | Freq: Every day | ORAL | 0 refills | Status: DC

## 2022-11-12 ENCOUNTER — Encounter (INDEPENDENT_AMBULATORY_CARE_PROVIDER_SITE_OTHER): Payer: Self-pay | Admitting: Family Medicine

## 2022-11-12 ENCOUNTER — Ambulatory Visit (INDEPENDENT_AMBULATORY_CARE_PROVIDER_SITE_OTHER): Payer: Managed Care, Other (non HMO) | Admitting: Family Medicine

## 2022-11-12 VITALS — BP 114/76 | HR 82 | Temp 97.7°F | Ht 66.0 in | Wt 230.0 lb

## 2022-11-12 DIAGNOSIS — E88819 Insulin resistance, unspecified: Secondary | ICD-10-CM

## 2022-11-12 DIAGNOSIS — E8881 Metabolic syndrome: Secondary | ICD-10-CM | POA: Diagnosis not present

## 2022-11-12 DIAGNOSIS — E559 Vitamin D deficiency, unspecified: Secondary | ICD-10-CM | POA: Diagnosis not present

## 2022-11-12 DIAGNOSIS — Z6837 Body mass index (BMI) 37.0-37.9, adult: Secondary | ICD-10-CM

## 2022-11-12 DIAGNOSIS — F3289 Other specified depressive episodes: Secondary | ICD-10-CM | POA: Diagnosis not present

## 2022-11-12 MED ORDER — BUPROPION HCL ER (SR) 150 MG PO TB12: 150.0000 mg | ORAL_TABLET | Freq: Two times a day (BID) | ORAL | 0 refills | Status: DC

## 2022-11-12 MED ORDER — VITAMIN D (ERGOCALCIFEROL) 1.25 MG (50000 UNIT) PO CAPS
50000.0000 [IU] | ORAL_CAPSULE | ORAL | 0 refills | Status: DC
Start: 2022-11-12 — End: 2022-12-13

## 2022-11-12 MED ORDER — METFORMIN HCL ER 500 MG PO TB24
500.0000 mg | ORAL_TABLET | Freq: Every day | ORAL | 0 refills | Status: DC
Start: 2022-11-12 — End: 2022-12-13

## 2022-11-12 NOTE — Assessment & Plan Note (Signed)
Doing well on metformin XR 500 mg once daily for IR and metabolic syndrome Last FLP 8/16 shows TG 299, HDL 39 and abdominal circumf > 35" consistent with metabolic syndrome She has lost 22 lb in 10 mos and is working on a low sugar/ low starch diet rich in lean protein and fiber  Continue prescribed dietary plan Add in more consistent exercise Track steps with 10,000 step per day goal

## 2022-11-12 NOTE — Assessment & Plan Note (Signed)
Last vitamin D Lab Results  Component Value Date   VD25OH 42.5 10/15/2022   Doing well on ergocaliferol weekly without adverse SE We reviewed lab from last visit with a goal vitamin D level 50-70  Continue RX ergocalciferol weekly and recheck in 3 mos

## 2022-11-12 NOTE — Assessment & Plan Note (Signed)
Reviewed lab from last visit Fasting insulin remains high, slightly improved from previous level 16.7 from 18.3 Cravings have improved on metformin XR 500 mg daily for IR. Tolerating well without adverse SE  Continue prescribed dietary plan, reducing high sugar items and adding in more walking for insulin sensitivity

## 2022-11-12 NOTE — Assessment & Plan Note (Signed)
Emotional eating under better control with Wellbutrin SR 150 mg bid without adverse SE. Without job stress, she feels ready to come off of Lexapro and has a good support system at home.  Reduce Lexapro to 1/2 tab once daily x 14 days, then stop Call if any changes in mood. Continue to work on proper sleep, good nutrition and stress reduction.

## 2022-11-12 NOTE — Progress Notes (Signed)
Office: [phone]  /  Fax: [phone]  WEIGHT SUMMARY AND BIOMETRICS  Starting Date: 12/27/21  Starting Weight: 252lb   Weight Lost Since Last Visit: 5lb   Vitals Temp: 97.7 F (36.5 C) BP: 114/76 Pulse Rate: 82 SpO2: 99 %   Body Composition  Body Fat %: 44.9 % Fat Mass (lbs): 103.6 lbs Muscle Mass (lbs): 120.8 lbs Total Body Water (lbs): 86.6 lbs Visceral Fat Rating : 12     HPI  Chief Complaint: OBESITY  Amber Cobb is here to discuss her progress with her obesity treatment plan. She is on the the Category 2 Plan and states she is following her eating plan approximately 90 % of the time. She states she is exercising 30 minutes 4 times per week.   Interval History:  Since last office visit she is down 5 lb She is up 1.2 lb muscle mass and down 6 lb of body fat in the past 4 weeks This gives her a net weight loss of 22 lb in the past 10 mos of medically supervised weight management.8.7% TBW loss She is out of work now (lost her job) She has been able to take care of her chickens, be more active, visit her parents and prepare meals now that she has more time Stress levels have reduced   Pharmacotherapy: metformin XR 500 mg daily for IR, Wellbutrin SR 150 mg bid for emotional eating  PHYSICAL EXAM:  Blood pressure 114/76, pulse 82, temperature 97.7 F (36.5 C), height 5\' 6"  (1.676 m), weight 230 lb (104.3 kg), SpO2 99 %. Body mass index is 37.12 kg/m.  General: She is overweight, cooperative, alert, well developed, and in no acute distress. PSYCH: Has normal mood, affect and thought process.   Lungs: Normal breathing effort, no conversational dyspnea.   ASSESSMENT AND PLAN  TREATMENT PLAN FOR OBESITY:  Recommended Dietary Goals  Amber Cobb is currently in the action stage of change. As such, her goal is to continue weight management plan. She has agreed to the Category 2 Plan.  Behavioral Intervention  We discussed the following Behavioral  Modification Strategies today: increasing lean protein intake, decreasing simple carbohydrates , increasing vegetables, increasing lower glycemic fruits, increasing water intake, keeping healthy foods at home, emotional eating strategies and understanding the difference between hunger signals and cravings, work on managing stress, creating time for self-care and relaxation measures, continue to practice mindfulness when eating, planning for success, and better snacking choices.  Additional resources provided today: NA  Recommended Physical Activity Goals  Amber Cobb has been advised to work up to 150 minutes of moderate intensity aerobic activity a week and strengthening exercises 2-3 times per week for cardiovascular health, weight loss maintenance and preservation of muscle mass.   She has agreed to Start aerobic activity with a goal of 150 minutes a week at moderate intensity.   Pharmacotherapy changes for the treatment of obesity: none  ASSOCIATED CONDITIONS ADDRESSED TODAY  Insulin resistance Assessment & Plan: Reviewed lab from last visit Fasting insulin remains high, slightly improved from previous level 16.7 from 18.3 Cravings have improved on metformin XR 500 mg daily for IR. Tolerating well without adverse SE  Continue prescribed dietary plan, reducing high sugar items and adding in more walking for insulin sensitivity  Orders: -     metFORMIN HCl ER; Take 1 tablet (500 mg total) by mouth daily with breakfast.  Dispense: 30 tablet; Refill: 0  Vitamin D deficiency Assessment & Plan: Last vitamin D Lab Results  Component Value Date  VD25OH 42.5 10/15/2022   Doing well on ergocaliferol weekly without adverse SE We reviewed lab from last visit with a goal vitamin D level 50-70  Continue RX ergocalciferol weekly and recheck in 3 mos   Orders: -     Vitamin D (Ergocalciferol); Take 1 capsule (50,000 Units total) by mouth every 7 (seven) days.  Dispense: 12 capsule; Refill:  0  Other depression with emotional eating Assessment & Plan: Emotional eating under better control with Wellbutrin SR 150 mg bid without adverse SE. Without job stress, she feels ready to come off of Lexapro and has a good support system at home.  Reduce Lexapro to 1/2 tab once daily x 14 days, then stop Call if any changes in mood. Continue to work on proper sleep, good nutrition and stress reduction.  Orders: -     buPROPion HCl ER (SR); Take 1 tablet (150 mg total) by mouth 2 (two) times daily.  Dispense: 180 tablet; Refill: 0  Morbid obesity (HCC) wtih starting BMI 40  BMI 37.0-37.9, adult  Metabolic syndrome Assessment & Plan: Doing well on metformin XR 500 mg once daily for IR and metabolic syndrome Last FLP 8/16 shows TG 299, HDL 39 and abdominal circumf > 35" consistent with metabolic syndrome She has lost 22 lb in 10 mos and is working on a low sugar/ low starch diet rich in lean protein and fiber  Continue prescribed dietary plan Add in more consistent exercise Track steps with 10,000 step per day goal       She was informed of the importance of frequent follow up visits to maximize her success with intensive lifestyle modifications for her multiple health conditions.   ATTESTASTION STATEMENTS:  Reviewed by clinician on day of visit: allergies, medications, problem list, medical history, surgical history, family history, social history, and previous encounter notes pertinent to obesity diagnosis.   I have personally spent 30 minutes total time today in preparation, patient care, nutritional counseling and documentation for this visit, including the following: review of clinical lab tests; review of medical tests/procedures/services.      Glennis Brink, DO DABFM, DABOM [facility] [address] [place], [place] [address] [phone]

## 2022-12-12 ENCOUNTER — Ambulatory Visit (INDEPENDENT_AMBULATORY_CARE_PROVIDER_SITE_OTHER): Payer: Managed Care, Other (non HMO) | Admitting: Family Medicine

## 2022-12-13 ENCOUNTER — Telehealth: Payer: Self-pay

## 2022-12-13 ENCOUNTER — Encounter: Payer: Self-pay | Admitting: Neurology

## 2022-12-13 ENCOUNTER — Other Ambulatory Visit (HOSPITAL_COMMUNITY): Payer: Self-pay

## 2022-12-13 ENCOUNTER — Ambulatory Visit (INDEPENDENT_AMBULATORY_CARE_PROVIDER_SITE_OTHER): Payer: 59 | Admitting: Family Medicine

## 2022-12-13 ENCOUNTER — Encounter (INDEPENDENT_AMBULATORY_CARE_PROVIDER_SITE_OTHER): Payer: Self-pay | Admitting: Family Medicine

## 2022-12-13 ENCOUNTER — Telehealth: Payer: Self-pay | Admitting: *Deleted

## 2022-12-13 VITALS — BP 118/81 | HR 88 | Temp 98.1°F | Ht 66.0 in | Wt 230.0 lb

## 2022-12-13 DIAGNOSIS — E88819 Insulin resistance, unspecified: Secondary | ICD-10-CM | POA: Diagnosis not present

## 2022-12-13 DIAGNOSIS — Z6837 Body mass index (BMI) 37.0-37.9, adult: Secondary | ICD-10-CM

## 2022-12-13 DIAGNOSIS — F3289 Other specified depressive episodes: Secondary | ICD-10-CM

## 2022-12-13 DIAGNOSIS — E559 Vitamin D deficiency, unspecified: Secondary | ICD-10-CM

## 2022-12-13 MED ORDER — VITAMIN D (ERGOCALCIFEROL) 1.25 MG (50000 UNIT) PO CAPS
50000.0000 [IU] | ORAL_CAPSULE | ORAL | 0 refills | Status: DC
Start: 2022-12-13 — End: 2023-02-19

## 2022-12-13 MED ORDER — METFORMIN HCL ER 500 MG PO TB24
500.0000 mg | ORAL_TABLET | Freq: Every day | ORAL | 0 refills | Status: DC
Start: 2022-12-13 — End: 2023-01-16

## 2022-12-13 MED ORDER — BUPROPION HCL ER (SR) 150 MG PO TB12: 150.0000 mg | ORAL_TABLET | Freq: Two times a day (BID) | ORAL | 0 refills | Status: DC

## 2022-12-13 NOTE — Telephone Encounter (Signed)
Pharmacy Patient Advocate Encounter  Received notification from [facility] that Prior Authorization for Topiramate ER 200MG  er sprinkle capsules has been DENIED. Please advise how you'd like to proceed. Full denial letter will be uploaded to the media tab. See denial reason below.    PA #/Case ID/Reference #: PA Case ID #: [id number]

## 2022-12-13 NOTE — Telephone Encounter (Signed)
Pt informed us via mychart message that she needs a PA for her Topiramate ER (Qudexy) due to having new insurance. She provided this card below:

## 2022-12-13 NOTE — Progress Notes (Signed)
Office: [phone]  /  Fax: [phone]  WEIGHT SUMMARY AND BIOMETRICS  Starting Date: 12/27/21  Starting Weight: 252lb   Weight Lost Since Last Visit: 0lb   Vitals Temp: 98.1 F (36.7 C) BP: 118/81 Pulse Rate: 88 SpO2: 97 %   Body Composition  Body Fat %: 44.5 % Fat Mass (lbs): 102.6 lbs Muscle Mass (lbs): 121.4 lbs Total Body Water (lbs): 87.4 lbs Visceral Fat Rating : 12    HPI  Chief Complaint: OBESITY  Amber Cobb is here to discuss her progress with her obesity treatment plan. She is on the the Category 2 Plan and states she is following her eating plan approximately 80 % of the time. She states she is exercising 30 minutes 2 times per week.   Interval History:  Since last office visit she is down 0 lb She has not been as active with the heat of the summer She has a net weight loss of 22 lb in the past 11 mos a few She has not been eating on plan as often She denies intake of sweets Cravings and hunger are under good control Getting vegetables out of the garden She is out of work and enjoying more time at home Emotional eating under good control  Pharmacotherapy: Wellbutrin SR 150 mg bid -- helps with cravings + metformin XR 500 mg once daily for IR  PHYSICAL EXAM:  Blood pressure 118/81, pulse 88, temperature 98.1 F (36.7 C), height 5\' 6"  (1.676 m), weight 230 lb (104.3 kg), SpO2 97%. Body mass index is 37.12 kg/m.  General: She is overweight, cooperative, alert, well developed, and in no acute distress. PSYCH: Has normal mood, affect and thought process.   Lungs: Normal breathing effort, no conversational dyspnea.   ASSESSMENT AND PLAN  TREATMENT PLAN FOR OBESITY:  Recommended Dietary Goals  Amber Cobb is currently in the action stage of change. As such, her goal is to continue weight management plan. She has agreed to the Category 2 Plan.  Behavioral Intervention  We discussed the following Behavioral Modification Strategies today:  increasing lean protein intake, decreasing simple carbohydrates , increasing vegetables, increasing lower glycemic fruits, increasing water intake, work on meal planning and preparation, keeping healthy foods at home, work on managing stress, creating time for self-care and relaxation measures, avoiding temptations and identifying enticing environmental cues, continue to practice mindfulness when eating, and planning for success.  Additional resources provided today: NA  Recommended Physical Activity Goals  Amber Cobb has been advised to work up to 150 minutes of moderate intensity aerobic activity a week and strengthening exercises 2-3 times per week for cardiovascular health, weight loss maintenance and preservation of muscle mass.   She has agreed to Start aerobic activity with a goal of 150 minutes a week at moderate intensity.   Pharmacotherapy changes for the treatment of obesity: none  ASSOCIATED CONDITIONS ADDRESSED TODAY  Vitamin D deficiency Assessment & Plan: Last vitamin D Lab Results  Component Value Date   VD25OH 42.5 10/15/2022   She is doing well on RX vitamin D weekly    Orders: -     Vitamin D (Ergocalciferol); Take 1 capsule (50,000 Units total) by mouth every 7 (seven) days.  Dispense: 12 capsule; Refill: 0  Insulin resistance -     metFORMIN HCl ER; Take 1 tablet (500 mg total) by mouth daily with breakfast.  Dispense: 30 tablet; Refill: 0  Other depression with emotional eating -     buPROPion HCl ER (SR); Take 1 tablet (150  mg total) by mouth 2 (two) times daily.  Dispense: 180 tablet; Refill: 0  Morbid obesity (HCC) wtih starting BMI 40  BMI 37.0-37.9, adult      She was informed of the importance of frequent follow up visits to maximize her success with intensive lifestyle modifications for her multiple health conditions.   ATTESTASTION STATEMENTS:  Reviewed by clinician on day of visit: allergies, medications, problem list, medical history,  surgical history, family history, social history, and previous encounter notes pertinent to obesity diagnosis.   I have personally spent 30 minutes total time today in preparation, patient care, nutritional counseling and documentation for this visit, including the following: review of clinical lab tests; review of medical tests/procedures/services.      Glennis Brink, DO DABFM, DABOM [facility] [address] [place], [place] [address] [phone]

## 2022-12-13 NOTE — Assessment & Plan Note (Signed)
Last vitamin D Lab Results  Component Value Date   VD25OH 42.5 10/15/2022   She is doing well on RX vitamin D weekly

## 2022-12-13 NOTE — Telephone Encounter (Signed)
Pharmacy Patient Advocate Encounter   Received notification from Physician's Office that prior authorization for Topiramate ER 200MG  er sprinkle capsules is required/requested.   Insurance verification completed.   The patient is insured through [facility] .   Per test claim: PA required; PA submitted to [facility] via CoverMyMeds Key/confirmation #/EOC [id number] Status is pending

## 2022-12-14 NOTE — Telephone Encounter (Signed)
Pharmacy Patient Advocate Encounter  Received notification from [facility] that Prior Authorization for Topiramate ER 200MG  er sprinkle capsules has been DENIED. Please advise how you'd like to proceed. Full denial letter will be uploaded to the media tab. See denial reason below.    PA #/Case ID/Reference #: PA Case ID #: [id number]

## 2022-12-14 NOTE — Telephone Encounter (Signed)
Sent message to patient. We may be switching her back to Topiramate IR.

## 2022-12-14 NOTE — Telephone Encounter (Signed)
We may need to try to appeal this.  The patient is on this for both migraine and IIH and she was intolerant to Topiramate IR.

## 2022-12-19 MED ORDER — TOPIRAMATE 200 MG PO TABS: 200.0000 mg | ORAL_TABLET | Freq: Every day | ORAL | 1 refills | Status: DC

## 2022-12-19 NOTE — Telephone Encounter (Signed)
Topiramate IR 200 mg at bedtime sent to pharmacy.

## 2022-12-19 NOTE — Telephone Encounter (Signed)
Will do. Thank you for the update.

## 2022-12-19 NOTE — Telephone Encounter (Signed)
She is on 200mg  at bedtime so I would say 200mg  unless you saw something else that said she was on less?

## 2022-12-19 NOTE — Addendum Note (Signed)
Addended by: Bertram Savin on: 12/19/2022 12:00 PM   Modules accepted: Orders

## 2022-12-19 NOTE — Telephone Encounter (Signed)
You can close this out. We will not pursue appeal right now. Patient will try 200 mg IR instead.

## 2022-12-27 NOTE — Progress Notes (Signed)
[facility] [address] [place]. [place] [address] [phone]       OFFICE FOLLOW UP NOTE  Ms. Shanee Dawdy Date of Birth:  [birth year] Medical Record Number:  [id number]    Primary neurologist: Dr. Lucia Gaskins Sleep neurologist: Dr. Vickey Huger Reason for visit: CPAP follow-up    SUBJECTIVE:   CHIEF COMPLAINT:  Chief Complaint  Patient presents with   Follow-up    Rm 8, here alone Pt is following up on CPAP. Pt states she has not used it much. States she feels like she can't breath with the mask on. The first 2 weeks in August she had a cold, she did not use it. States that she pulls the mask off during her sleep, when she notices it is too late to put it back on.    Follow-up visit:  Prior visit: 04/30/2022  Brief HPI:   Lilikoi Gabelman is a 47 y.o. female who was initially evaluated by Dr. 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  Recommended further workup and referred to sleep study to evaluate for sleep apnea.  MRI brain 08/2021 showed partially empty sella otherwise unremarkable. LP 11/2021 opening pressure of 26. CTV 11/2021 unremarkable.  Started on topiramate for IIH.  Evaluated by Dr. Vickey Huger on 12/19/2021 for concerns of underlying sleep apnea with daytime sleepiness and headaches, ESS 16/24. Sleep study 01/12/2022 showed mild OSA with AHI of 10, AutoPap initiated 02/16/2022.  At prior visit, compliance report shows satisfactory usage and optimal residual AHI.  ESS 9/24.   Interval history:  Compliance report over the past 90 days shows low compliance although optimal residual AHI with use. Current use of nasal pillow mask. She continues having difficulty keeping the mask on throughout the night, will remove when sleeping or will wake up with mask on forehead but otherwise feels as if she tolerates it okay.  Believes she tolerates the current pressure well.  She has not tried any other mask types but knows she would not be able  to tolerate full face type mask.  She is interested in alternative treatment options.        ROS:   14 system review of systems performed and negative with exception of those listed in HPI  PMH:  Past Medical History:  Diagnosis Date   Anemia    Anxiety    Back pain    Carpal tunnel syndrome    Complex atypical endometrial hyperplasia    Depression    Diverticulosis    Edema of both lower extremities    Endometrial cancer (HCC)    Gallbladder disorder    Gallstones    Generalized anxiety disorder    GERD (gastroesophageal reflux disease)    History of kidney stones    Hypertension    Joint pain    Migraines    Obesity    UTI (urinary tract infection)     PSH:  Past Surgical History:  Procedure Laterality Date   CARPAL TUNNEL RELEASE Bilateral    CHOLECYSTECTOMY     ENDOMETRIAL BIOPSY  05/05/2019   ROBOTIC ASSISTED TOTAL HYSTERECTOMY WITH BILATERAL SALPINGO OOPHERECTOMY N/A 06/17/2019   Procedure: XI ROBOTIC ASSISTED TOTAL HYSTERECTOMY WITH BILATERAL SALPINGECTOMY AND  LYMPH NODE DISSECTION;  Surgeon: Carver Fila, MD;  Location: [facility];  Service: Gynecology;  Laterality: N/A;   SENTINEL NODE BIOPSY N/A 06/17/2019   Procedure: SENTINEL NODE BIOPSY;  Surgeon: Carver Fila, MD;  Location: [facility];  Service:  Gynecology;  Laterality: N/A;    Social History:  Social History   Socioeconomic History   Marital status: Married    Spouse name: Timothy   Number of children: 2   Years of education: Not on file   Highest education level: Associate degree: occupational, Scientist, product/process development, or vocational program  Occupational History   Not on file  Tobacco Use   Smoking status: Never   Smokeless tobacco: Never  Vaping Use   Vaping status: Never Used  Substance and Sexual Activity   Alcohol use: Not Currently   Drug use: Never   Sexual activity: Yes  Other Topics Concern   Not on file  Social History Narrative   Lives at  home with husband and children   R handed   Caffeine: none   Social Determinants of Health   Financial Resource Strain: Not on file  Food Insecurity: No Food Insecurity (05/05/2019)   [facility], [facility]   Hunger Vital Sign    Worried About Running Out of Food in the Last Year: Never true    Ran Out of Food in the Last Year: Never true  Transportation Needs: No Transportation Needs (05/05/2019)   Received from [facility], [facility]   [facility] - Transportation    Lack of Transportation (Medical): No    Lack of Transportation (Non-Medical): No  Physical Activity: Insufficiently Active (09/26/2022)   Received from [facility], [facility]   Exercise Vital Sign    Days of Exercise per Week: 3 days    Minutes of Exercise per Session: 30 min  Stress: Stress Concern Present (09/26/2022)   Received from [facility], [facility] - Occupational Stress Questionnaire    Feeling of Stress : To some extent  Social Connections: Not on file  Intimate Partner Violence: Not At Risk (09/26/2022)   Received from [facility], [facility]   Humiliation, Afraid, Rape, and Kick questionnaire    Fear of Current or Ex-Partner: No    Emotionally Abused: No    Physically Abused: No    Sexually Abused: No    Family History:  Family History  Problem Relation Age of Onset   Obesity Mother    Anxiety disorder Mother    Depression Mother    Hypertension Mother    Diabetes Mother    COPD Mother    Thyroid disease Mother    Migraines Mother    High Cholesterol Mother    Sleep apnea Mother    Obesity Father    Thyroid disease Sister    Migraines Sister    Thyroid disease Brother    Endometrial cancer Maternal Aunt    Lung cancer Maternal Uncle    Throat cancer Maternal Grandfather    Colon cancer Neg Hx    Esophageal cancer Neg Hx    Pancreatic cancer Neg Hx    Colon polyps Neg Hx      Medications:   Current Outpatient Medications on File Prior to Visit  Medication Sig Dispense Refill   buPROPion (WELLBUTRIN SR) 150 MG 12 hr tablet Take 1 tablet (150 mg total) by mouth 2 (two) times daily. 180 tablet 0   butalbital-acetaminophen-caffeine (FIORICET) 50-325-40 MG tablet Take 1 tablet by mouth every 4 (four) hours as needed for headache. 14 tablet 0   loratadine (CLARITIN) 10 MG tablet Take 10 mg by mouth daily.     losartan (COZAAR)  50 MG tablet Take 1 tablet (50 mg total) by mouth daily. 90 tablet 0   Melatonin 5 MG CAPS Take by mouth.     metFORMIN (GLUCOPHAGE-XR) 500 MG 24 hr tablet Take 1 tablet (500 mg total) by mouth daily with breakfast. 30 tablet 0   modafinil (PROVIGIL) 200 MG tablet Take 1 tablet (200 mg total) by mouth daily. 30 tablet 2   Multiple Vitamin (MULTIVITAMIN) capsule Take 1 capsule by mouth daily.     topiramate (TOPAMAX) 200 MG tablet Take 1 tablet (200 mg total) by mouth at bedtime. 90 tablet 1   Vitamin D, Ergocalciferol, (DRISDOL) 1.25 MG (50000 UNIT) CAPS capsule Take 1 capsule (50,000 Units total) by mouth every 7 (seven) days. 12 capsule 0   hydrocortisone (ANUSOL-HC) 25 MG suppository Place 1 suppository (25 mg total) rectally every 12 (twelve) hours. (Patient not taking: Reported on 12/31/2022) 12 suppository 1   No current facility-administered medications on file prior to visit.    Allergies:   Allergies  Allergen Reactions   Chloraprep One Step [Chlorhexidine Gluconate] Hives      OBJECTIVE:  Physical Exam  Vitals:   12/31/22 1047  BP: 133/83  Pulse: 82  Weight: 234 lb (106.1 kg)  Height: 5\' 6"  (1.676 m)   Body mass index is 37.77 kg/m. No results found.  General: well developed, well nourished, very pleasant middle-age Caucasian female, seated, in no evident distress Head: head normocephalic and atraumatic.   Neck: supple with no carotid or supraclavicular bruits Cardiovascular: regular rate and rhythm, no  murmurs Musculoskeletal: no deformity Skin:  no rash/petichiae Vascular:  Normal pulses all extremities   Neurologic Exam Mental Status: Awake and fully alert. Oriented to place and time. Recent and remote memory intact. Attention span, concentration and fund of knowledge appropriate. Mood and affect appropriate.  Cranial Nerves: Pupils equal, briskly reactive to light. Extraocular movements full without nystagmus. Visual fields full to confrontation. Hearing intact. Facial sensation intact. Face, tongue, palate moves normally and symmetrically.  Motor: Normal bulk and tone. Normal strength in all tested extremity muscles Sensory.: intact to touch , pinprick , position and vibratory sensation.  Coordination: Rapid alternating movements normal in all extremities. Finger-to-nose and heel-to-shin performed accurately bilaterally. Gait and Station: Arises from chair without difficulty. Stance is normal. Gait demonstrates normal stride length and balance without use of AD.  Reflexes: 1+ and symmetric. Toes downgoing.         ASSESSMENT/PLAN: Dwana Wane is a 47 y.o. year old female    OSA on CPAP : Compliance report shows low usage although optimal residual AHI with use.  Persistent difficulty keeping mask on, will remove when sleeping. She is not interested in trying different mask options as she does not believe they will be tolerated.  She is interested in alternative treatment options, possible benefit with oral appliance. Will discuss further with Dr. Vickey Huger, will place referral for dentistry if she feels this would be a good option. Would likely not be a good candidate for inspire as apnea mild (total AHI 10) and BMI >35 (BMI 37.7).  Did encourage her to continue to use CPAP during the interval time to ensure adequate control of sleep apnea.  IIH: Currently on topiramate, followed by Dr. Lucia Gaskins     Follow-up as needed for sleep apnea at this time, if pursues oral appliance no  indication for routine apnea follow-up in this office   CC:  PCP: Virl Diamond, MD    I spent 25  minutes of face-to-face and non-face-to-face time with patient.  This included previsit chart review, lab review, study review, order entry, electronic health record documentation, patient education regarding diagnosis of sleep apnea with review and discussion of compliance report and answered all other questions to patient's satisfaction   Ihor Austin, [facility]  [facility] [address] [place], [place] [address]  Phone [phone] Fax [phone] Note: This document was prepared with digital dictation and possible smart phrase technology. Any transcriptional errors that result from this process are unintentional.

## 2022-12-31 ENCOUNTER — Encounter: Payer: Self-pay | Admitting: Adult Health

## 2022-12-31 ENCOUNTER — Ambulatory Visit: Payer: 59 | Admitting: Adult Health

## 2022-12-31 VITALS — BP 133/83 | HR 82 | Ht 66.0 in | Wt 234.0 lb

## 2022-12-31 DIAGNOSIS — G4733 Obstructive sleep apnea (adult) (pediatric): Secondary | ICD-10-CM | POA: Diagnosis not present

## 2022-12-31 DIAGNOSIS — Z789 Other specified health status: Secondary | ICD-10-CM

## 2022-12-31 NOTE — Patient Instructions (Addendum)
Will follow up regarding use of oral appliance for sleep apnea management - I will notify you via MyChart regarding her recommendations

## 2023-01-02 ENCOUNTER — Other Ambulatory Visit: Payer: Self-pay | Admitting: Neurology

## 2023-01-02 DIAGNOSIS — G932 Benign intracranial hypertension: Secondary | ICD-10-CM

## 2023-01-02 MED ORDER — TOPIRAMATE 100 MG PO TABS: 300.0000 mg | ORAL_TABLET | Freq: Every day | ORAL | 3 refills | Status: DC

## 2023-01-02 NOTE — Telephone Encounter (Signed)
Spoke to pt wants to speak to Dr. Frances Furbish about LP Pt states procedure was very painful last time  Pt has agreed to increase topiramate to 300 mg Pt states is still taking 200mg  nightly Will forward to Dr Lucia Gaskins

## 2023-01-02 NOTE — Telephone Encounter (Signed)
Since there is not a 300mg  pill, I ordered a prescription for 100mg  and take 3 at bedtime. She should start right away.  Would she consider taking valium or ativan prior to LP instead of sedation? She will have to get someone to drive her to the procedure which she would anyway. Let me know if she is amenable to taking these meds prior to LP or I can call her and discuss tomorrow, thanks

## 2023-01-02 NOTE — Telephone Encounter (Signed)
Her papilledema is worse. If she can tolerate, Dr. Elmer Picker recommends increasing her topiramate to 300mg  and ordering a therapeutic LP.  Can you please call patient and see if she agrees? Has she been taking her Topiramate? I'll be working this afternoon let me know if I need to give her a call.

## 2023-01-02 NOTE — Telephone Encounter (Signed)
I called the patient and discussed. She is amenable to proceeding with Topiramate 300 mg at bedtime right away and she understands there is no 300 mg pill so she will take 3 of the 100 mg pills. She states if she has no choice she will proceed with LP with no full sedation but she ideally does not want to be alert/aware during the LP at all and would like to be asleep. She states she didn't feel anxious at her last LP but she was in severe pain and couldn't straighten her leg even though she was numbed. She does understand the importance of the procedure and she did feels that it helped last time. I told her I would relay her concerns and see what Dr Lucia Gaskins recommends. Pt is aware that more sedation would be harder on her and she would likely have to have it done at the hospital.

## 2023-01-03 ENCOUNTER — Other Ambulatory Visit (INDEPENDENT_AMBULATORY_CARE_PROVIDER_SITE_OTHER): Payer: Self-pay | Admitting: Family Medicine

## 2023-01-03 DIAGNOSIS — I10 Essential (primary) hypertension: Secondary | ICD-10-CM

## 2023-01-07 NOTE — Telephone Encounter (Signed)
Called pt and moved up her appt to 9/23 @ 2:00 pm. She said she already saw Dr. Shirlyn Goltz last week and was informed that the pressure in her eyes is the same and recommended the increased Topiramate due to worsening symptoms.

## 2023-01-07 NOTE — Telephone Encounter (Signed)
Jillian: I would like to see how the 300mg  Topiramate does for her and discuss in person. Can we have her appointment moved up to me in about 4-6 weeks? If she is seeing Dr. Shirlyn Goltz soon I can see her after he checks her eyes on the increased dose of topiramate. Let me know thanks

## 2023-01-08 NOTE — Telephone Encounter (Signed)
We know about the increased topiramate does to 300mg , she keeps mentioning that - did she get it? I called it I and she was supposed to have already started.

## 2023-01-09 ENCOUNTER — Encounter: Payer: Self-pay | Admitting: Adult Health

## 2023-01-09 DIAGNOSIS — Z789 Other specified health status: Secondary | ICD-10-CM

## 2023-01-09 DIAGNOSIS — G4733 Obstructive sleep apnea (adult) (pediatric): Secondary | ICD-10-CM

## 2023-01-09 NOTE — Telephone Encounter (Signed)
Saw patient 8/19 - sent you the note at that time seeing if she would be appropriate to pursue dental device for sleep apnea as she has been having difficulties with CPAP therapy. Please let me know so I can update patient and place order. Thank you.

## 2023-01-14 ENCOUNTER — Other Ambulatory Visit: Payer: Self-pay | Admitting: Neurology

## 2023-01-15 ENCOUNTER — Telehealth: Payer: Self-pay | Admitting: Adult Health

## 2023-01-15 NOTE — Telephone Encounter (Signed)
Referral for sleep studies fax to [facility]. Phone: [phone], Fax: [phone].

## 2023-01-16 ENCOUNTER — Ambulatory Visit: Payer: 59 | Admitting: Family Medicine

## 2023-01-16 ENCOUNTER — Encounter: Payer: Self-pay | Admitting: Family Medicine

## 2023-01-16 VITALS — BP 126/83 | HR 70 | Temp 97.9°F | Ht 66.0 in | Wt 233.0 lb

## 2023-01-16 DIAGNOSIS — E88819 Insulin resistance, unspecified: Secondary | ICD-10-CM

## 2023-01-16 DIAGNOSIS — G932 Benign intracranial hypertension: Secondary | ICD-10-CM | POA: Diagnosis not present

## 2023-01-16 DIAGNOSIS — E559 Vitamin D deficiency, unspecified: Secondary | ICD-10-CM | POA: Diagnosis not present

## 2023-01-16 DIAGNOSIS — Z6837 Body mass index (BMI) 37.0-37.9, adult: Secondary | ICD-10-CM

## 2023-01-16 MED ORDER — METFORMIN HCL ER 500 MG PO TB24: 500.0000 mg | ORAL_TABLET | Freq: Every day | ORAL | 0 refills | Status: DC

## 2023-01-16 NOTE — Telephone Encounter (Signed)
She has an appointment on 9/10.

## 2023-01-16 NOTE — Assessment & Plan Note (Signed)
Tolerating metformin XR 500 mg once daily with food well without adverse SE Actively working on weight reduction Had to pause her daily walks due to daily headaches  Continue metformin at current dose Continue prescribed dietary plan Update labs next visit

## 2023-01-16 NOTE — Assessment & Plan Note (Signed)
Last vitamin D Lab Results  Component Value Date   VD25OH 42.5 10/15/2022   Taking RX vitamin D weekly Energy level has improved  Repeat Vitamin D level at October visit

## 2023-01-16 NOTE — Assessment & Plan Note (Signed)
Worsening Awaiting upcoming visit with Dr Lucia Gaskins 9/23 Topiramate increased to 300 mg at bedtime by neuro but she is still bothered by daily headaches that cause anorexia, limited ability to move her head or exercise.  She has recently had her optho exam and denies blurry vision.  Denies vomitting.  Continue plan of care per Neuro Look for improvements in HA post LP  procedure Work on hydrating well, small amounts of high protein/ high fiber foods.

## 2023-01-16 NOTE — Progress Notes (Signed)
Office: [phone]  /  Fax: [phone]  WEIGHT SUMMARY AND BIOMETRICS  Starting Date: 12/27/21  Starting Weight: 252lb   Weight Lost Since Last Visit: 0lb   Vitals Temp: 97.9 F (36.6 C) BP: 126/83 Pulse Rate: 70 SpO2: 98 %   Body Composition  Body Fat %: 45.4 % Fat Mass (lbs): 105.8 lbs Muscle Mass (lbs): 120.8 lbs Total Body Water (lbs): 86.6 lbs Visceral Fat Rating : 12   HPI  Chief Complaint: OBESITY  Amber Cobb is here to discuss her progress with her obesity treatment plan. She is on the the Category 2 Plan and states she is following her eating plan approximately 90 % of the time. She states she is exercising 0 minutes 0 times per week.   Interval History:  Since last office visit she is up 3 lb She has not been able to exercise due to worsening of IIH in the past month Her dose of Topamax was increased by neuro to 300 mg at bedtime She is planning to have another LP done soon She is skipping more meals due to headaches Denies vision changes Her husband is supportive  She is struggling to get in enough water  Pharmacotherapy: Wellbutrin Sr 150 mg bid, metformin XR 500 mg QD  PHYSICAL EXAM:  Blood pressure 126/83, pulse 70, temperature 97.9 F (36.6 C), height 5\' 6"  (1.676 m), weight 233 lb (105.7 kg), SpO2 98%. Body mass index is 37.61 kg/m.  General: She is overweight, cooperative, alert, well developed, and in no acute distress. PSYCH: Has normal mood, affect and thought process.   Lungs: Normal breathing effort, no conversational dyspnea.   ASSESSMENT AND PLAN  TREATMENT PLAN FOR OBESITY:  Recommended Dietary Goals  Amber Cobb is currently in the action stage of change. As such, her goal is to continue weight management plan. She has agreed to the Category 2 Plan.  Behavioral Intervention  We discussed the following Behavioral Modification Strategies today: increasing lean protein intake, decreasing simple carbohydrates , increasing  vegetables, increasing lower glycemic fruits, increasing water intake, keeping healthy foods at home, avoiding temptations and identifying enticing environmental cues, continue to practice mindfulness when eating, planning for success, and better snacking choices.  Additional resources provided today: NA  Recommended Physical Activity Goals  Amber Cobb has been advised to work up to 150 minutes of moderate intensity aerobic activity a week and strengthening exercises 2-3 times per week for cardiovascular health, weight loss maintenance and preservation of muscle mass.   She has agreed to Think about ways to increase daily physical activity and overcoming barriers to exercise  Pharmacotherapy changes for the treatment of obesity: none  ASSOCIATED CONDITIONS ADDRESSED TODAY  Vitamin D deficiency Assessment & Plan: Last vitamin D Lab Results  Component Value Date   VD25OH 42.5 10/15/2022   Taking RX vitamin D weekly Energy level has improved  Repeat Vitamin D level at October visit   Insulin resistance Assessment & Plan: Tolerating metformin XR 500 mg once daily with food well without adverse SE Actively working on weight reduction Had to pause her daily walks due to daily headaches  Continue metformin at current dose Continue prescribed dietary plan Update labs next visit  Orders: -     metFORMIN HCl ER; Take 1 tablet (500 mg total) by mouth daily with breakfast.  Dispense: 90 tablet; Refill: 0  Morbid obesity (HCC) wtih starting BMI 40  BMI 37.0-37.9, adult  IIH (idiopathic intracranial hypertension) Assessment & Plan: Worsening Awaiting upcoming visit with Dr Lucia Gaskins  9/23 Topiramate increased to 300 mg at bedtime by neuro but she is still bothered by daily headaches that cause anorexia, limited ability to move her head or exercise.  She has recently had her optho exam and denies blurry vision.  Denies vomitting.  Continue plan of care per Neuro Look for improvements in  HA post LP  procedure Work on hydrating well, small amounts of high protein/ high fiber foods.       She was informed of the importance of frequent follow up visits to maximize her success with intensive lifestyle modifications for her multiple health conditions.   ATTESTASTION STATEMENTS:  Reviewed by clinician on day of visit: allergies, medications, problem list, medical history, surgical history, family history, social history, and previous encounter notes pertinent to obesity diagnosis.   I have personally spent 30 minutes total time today in preparation, patient care, nutritional counseling and documentation for this visit, including the following: review of clinical lab tests; review of medical tests/procedures/services.      Glennis Brink, DO DABFM, DABOM [facility] [address] [place], [place] [address] [phone]

## 2023-01-21 MED ORDER — BUTALBITAL-APAP-CAFFEINE 50-325-40 MG PO TABS: 1.0000 | ORAL_TABLET | ORAL | 0 refills | Status: DC | PRN

## 2023-01-22 ENCOUNTER — Encounter: Payer: Self-pay | Admitting: Neurology

## 2023-02-04 ENCOUNTER — Encounter: Payer: Self-pay | Admitting: Neurology

## 2023-02-04 ENCOUNTER — Ambulatory Visit: Payer: 59 | Admitting: Neurology

## 2023-02-04 VITALS — BP 121/81 | HR 73 | Ht 66.0 in | Wt 235.0 lb

## 2023-02-04 DIAGNOSIS — G4489 Other headache syndrome: Secondary | ICD-10-CM | POA: Diagnosis not present

## 2023-02-04 DIAGNOSIS — H4710 Unspecified papilledema: Secondary | ICD-10-CM | POA: Diagnosis not present

## 2023-02-04 DIAGNOSIS — R519 Headache, unspecified: Secondary | ICD-10-CM

## 2023-02-04 DIAGNOSIS — G932 Benign intracranial hypertension: Secondary | ICD-10-CM | POA: Diagnosis not present

## 2023-02-04 NOTE — Progress Notes (Signed)
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  We did increase to 300 mg on August 21st 2024.  This was after her last appointment.  At this time we will order LP as she has had great response in the past and see what upcoming appointment shows since we have changed her dose since she last saw ophthalmology. Possibly having side effects to high dose topiramate fatigue especially. Headache are not migrainous she has "brain freeze" headaches, constant, pressure and if laughing or coughing.   Getting a mouthguard for sleep apnea today was pulling off her cpap. Cpap not helping her sinus issues, persistent cough. She went to [facility]. No problems with vision. Headaches are worse with coughing, bending over, pressure. Lp brought relief for a year and can check pressure as well and remove enough fluid to normalize closing pressure. If that does not work we can add or change to acetazolamide.   Patient complains of symptoms per HPI as well as the following symptoms: headache . Pertinent negatives and positives per HPI. All others negative   09/06/2022: She has a follow up with Shanda Bumps for cpap, doing well. She has lost 17 pounds. Hecker's notes: reviewed 07/27/2022: OCT RNFL(91/97) improved since 04/2022(96/103) grade 1 edema OU improving, feeling better, only one headache during the storm and eclipse, all is going well. She took fioricet and it helped.  Doing well on Topiramate ER. Daily headaches resolved had them for 1.5 years and now 1 headache a month maybe vision much improved. Last moderate to severe headache in 6 months.   Patient complains of symptoms per HPI as well as the following symptoms: none . Pertinent negatives and positives per HPI. All others negative  02/2022: cbc/cmp,tsh,hgba1c nml   Reviewed:  No results found for this or any previous visit (from the past 2160 hour(s)).     Latest Ref Rng & Units 10/15/2022   11:20 AM 12/27/2021   10:07 AM 06/15/2019    2:37 PM  CMP  Glucose 70 - 99 mg/dL 96  91  540   BUN 6 - 24 mg/dL 15  11  19    Creatinine 0.57 - 1.00 mg/dL 9.81  1.91  4.78   Sodium 134 - 144 mmol/L 139  139  139   Potassium 3.5 - 5.2 mmol/L 4.4  4.3  3.5   Chloride 96 - 106 mmol/L 105  104  104   CO2 20 - 29 mmol/L 19  18  27    Calcium 8.7 - 10.2 mg/dL 9.1  9.1  8.9   Total Protein 6.0 - 8.5 g/dL 7.0  7.5  7.6   Total Bilirubin 0.0 - 1.2 mg/dL 0.5  0.3  0.6   Alkaline Phos 44 - 121 IU/L 77  78  66   AST 0 - 40 IU/L 13  11  16    ALT 0 - 32 IU/L 14  14  16        Latest Ref Rng &  Units 12/27/2021   10:07 AM 06/15/2019    2:37 PM  CBC  WBC 3.4 - 10.8 x10E3/uL 6.1  7.9   Hemoglobin 11.1 - 15.9 g/dL 95.6  21.3   Hematocrit 34.0 - 46.6 % 41.2  41.3   Platelets 150 - 450 x10E3/uL 320  405      05/21/2022: Her prescription is better she saw hecker in December (I only have notes from 9/1) on 12/15 the ONH edema was improved per patient. On Topamax and doing well. She was diagnosed with mild OSA and is using cpap. In 9/1 she had grade 2 papilledema. I need her notes from [place] from December, will request. She I not having side effects on the extended release 200mg  trokendi. She has brain freeze headaches but not the all day every day headaches so headaches are better. No synope, sometimes a little dizziness on standing. Request hecker's notes. She sees Elmer Picker in March so we will see her after that. Mild numbness or  tingling but that is more in the cold. May be Raynaud or just normal vasoconstriction.   Patient complains of symptoms per HPI as well as the following symptoms: losing weight . Pertinent negatives and positives per HPI. 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 Had sleep evaluation and is getting a sleep study in September. She is on Topamax 100mg  BID and she has been to the weight clinic and has lost 5 pounds too and the headache has eased up a bit. She has a lot more energy. Having fatigue with the topiramate IR will change to ER and can take it all at night. She is seeing Elmer Picker eye care on the 1st again. She will contact me with the results and I'll get the notes. She was having daily headaches, doing "much better" or any in the last 2 weeks. MRI partially empty sella.   Patient complains of symptoms per HPI as well as the following symptoms: drowsiness due to medication . Pertinent negatives and positives per HPI. All others negative   HPI:  Amber Cobb is a 47 y.o. female here as requested by Virl Diamond, MD for evaluation of IDIOPATHIC INTRACRANIAL HYPERTENSION or Horner's syndrome from [facility].  I reviewed had her eye care and she was seen for headache, having headaches that feel like "a brain freeze" will hurt really bad for a minute then go away, she has been going on for almost a year, patient states that her right upper lid is drooping, she has a past medical history of acid reflux, anemia, migraines, anxiety, depression, hypertension.  Examination showed visual acuity OD 20/25 and OS 20/20, pupils equally round and reactive to light no APD, extraocular movements full, it did show optic nerve edema OU and a mild ptosis, headaches going on for a year, no other neurologic symptoms, worse with hard laughing or bowel movement, they ordered an MRI of the head and neck to rule out masses and tumors, no  ophthalmoplegia, discussed that she may need an LP as well.     She has a low level headache every day for 1.5 years. With pressure she gets worse symptoms of headaches, low level headache every day, mostly in the forehead, she snores heavily, headaches every day, she has a film over her eyes, she cannot lose weight, she has migraines those are different and not often, she started having migraines about 4 years ago and she was waking up with them  when she gets a migraine pulsating, pounding, light and sound sensitivity and nausea but the daily headaches are different. She feels pressure in ears, hears swishing in the ears. She has gained weight. No energy. She naps every day, 3x a day, daily headaches are mild to moderate, migraines can be severe 1-2x a week, ibuprofen. Ibuprofen helps. Migraines for 4 years. She is not interested in this time with a triptan, ibuprofen helps, discussed rebound headache with ibuprofen.No other focal neurologic deficits, associated symptoms, inciting events or modifiable factors.  Reviewed notes, labs and imaging from outside physicians, which showed:  CT angio of the neck: Showed normal vasculature of the neck, the right vertebral artery is diminutive and nondominant, normal variant, likely accounting for the findings on the prior MRI.  MRI of the neck soft tissue: Showed abnormal appearance of the right vertebral artery, neck CT was recommended as above and was unremarkable.  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 01/2021: tsh,cbc,cmp normal  Review of Systems: Patient complains of symptoms per HPI as well as the following symptoms headaches. Pertinent negatives and positives per HPI. All others negative.   Social History   Socioeconomic History   Marital status: Married    Spouse name: Marcial Pacas   Number of children: 2    Years of education: Not on file   Highest education level: Associate degree: occupational, Scientist, product/process development, or vocational program  Occupational History   Not on file  Tobacco Use   Smoking status: Never   Smokeless tobacco: Never  Vaping Use   Vaping status: Never Used  Substance and Sexual Activity   Alcohol use: Not Currently   Drug use: Never   Sexual activity: Yes  Other Topics Concern   Not on file  Social History Narrative   Lives at home with husband and children   R handed   Caffeine: none   Social Determinants of Health   Financial Resource Strain: Not on file  Food Insecurity: No Food Insecurity (05/05/2019)   Received from [facility], [facility]   Hunger Vital Sign    Worried About Running Out of Food in the Last Year: Never true    Ran Out of Food in the Last Year: Never true  Transportation Needs: No Transportation Needs (05/05/2019)   Received from [facility], [facility]   [facility] - [facility]    Lack of Transportation (Medical): No    Lack of Transportation (Non-Medical): No  Physical Activity: Insufficiently Active (09/26/2022)   Received from [facility], [facility]   Exercise Vital Sign    Days of Exercise per Week: 3 days    Minutes of Exercise per Session: 30 min  Stress: Stress Concern Present (09/26/2022)   Received from [facility], [facility] Occupational [facility] - Occupational Stress Questionnaire    Feeling of Stress : To some extent  Social Connections: Not on file  Intimate Partner Violence: Not At Risk (09/26/2022)   Received from [facility], [facility]   Humiliation, Afraid, Rape, and Kick questionnaire    Fear of Current or Ex-Partner: No    Emotionally Abused: No    Physically Abused: No    Sexually Abused: No    Family History  Problem Relation Age of Onset   Obesity Mother    Anxiety disorder Mother  Depression Mother    Hypertension Mother    Diabetes  Mother    COPD Mother    Thyroid disease Mother    Migraines Mother    High Cholesterol Mother    Sleep apnea Mother    Obesity Father    Thyroid disease Sister    Migraines Sister    Thyroid disease Brother    Endometrial cancer Maternal Aunt    Lung cancer Maternal Uncle    Throat cancer Maternal Grandfather    Colon cancer Neg Hx    Esophageal cancer Neg Hx    Pancreatic cancer Neg Hx    Colon polyps Neg Hx     Past Medical History:  Diagnosis Date   Anemia    Anxiety    Back pain    Carpal tunnel syndrome    Complex atypical endometrial hyperplasia    Depression    Diverticulosis    Edema of both lower extremities    Endometrial cancer (HCC)    Gallbladder disorder    Gallstones    Generalized anxiety disorder    GERD (gastroesophageal reflux disease)    History of kidney stones    Hypertension    Joint pain    Migraines    Obesity    UTI (urinary tract infection)     Patient Active Problem List   Diagnosis Date Noted   Polyphagia 07/16/2022   Morbid obesity (HCC) wtih starting BMI 40 06/25/2022   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   OSA (obstructive sleep apnea) 02/08/2022   Benign essential HTN 02/08/2022   Sleep-disordered breathing 01/10/2022   Vitamin D deficiency 01/10/2022   Insulin resistance 01/10/2022   Depression 01/10/2022   Metabolic syndrome 01/02/2022   Excessive daytime sleepiness 12/19/2021   Chronic fatigue 12/19/2021   Snoring 12/19/2021   IIH (idiopathic intracranial hypertension) 11/06/2021   Chronic daily headache 11/06/2021   Endometrial cancer (HCC) 06/22/2019   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    Past Surgical History:  Procedure Laterality Date   CARPAL TUNNEL RELEASE Bilateral    CHOLECYSTECTOMY     ENDOMETRIAL BIOPSY  05/05/2019   ROBOTIC ASSISTED TOTAL HYSTERECTOMY WITH BILATERAL SALPINGO OOPHERECTOMY N/A 06/17/2019   Procedure: XI ROBOTIC ASSISTED TOTAL HYSTERECTOMY WITH  BILATERAL SALPINGECTOMY AND  LYMPH NODE DISSECTION;  Surgeon: Carver Fila, MD;  Location: [facility] [place];  Service: Gynecology;  Laterality: N/A;   SENTINEL NODE BIOPSY N/A 06/17/2019   Procedure: SENTINEL NODE BIOPSY;  Surgeon: Carver Fila, MD;  [facility];  Service: Gynecology;  Laterality: N/A;    Current Outpatient Medications  Medication Sig Dispense Refill   buPROPion (WELLBUTRIN SR) 150 MG 12 hr tablet Take 1 tablet (150 mg total) by mouth 2 (two) times daily. 180 tablet 0   butalbital-acetaminophen-caffeine (FIORICET) 50-325-40 MG tablet Take 1 tablet by mouth every 4 (four) hours as needed for headache. 14 tablet 0   loratadine (CLARITIN) 10 MG tablet Take 10 mg by mouth daily.     losartan (COZAAR) 50 MG tablet Take 1 tablet by mouth once daily 90 tablet 0   Melatonin 5 MG CAPS Take by mouth.     metFORMIN (GLUCOPHAGE-XR) 500 MG 24 hr tablet Take 1 tablet (500 mg total) by mouth daily with breakfast. 90 tablet 0   modafinil (PROVIGIL) 200 MG tablet Take 1 tablet (200 mg total) by mouth daily. 30 tablet 2   Multiple Vitamin (MULTIVITAMIN) capsule Take 1 capsule by mouth daily.  topiramate (TOPAMAX) 100 MG tablet Take 3 tablets (300 mg total) by mouth at bedtime. 270 tablet 3   Vitamin D, Ergocalciferol, (DRISDOL) 1.25 MG (50000 UNIT) CAPS capsule Take 1 capsule (50,000 Units total) by mouth every 7 (seven) days. 12 capsule 0   No current facility-administered medications for this visit.    Allergies as of 02/04/2023 - Review Complete 02/04/2023  Allergen Reaction Noted   Chloraprep one step [chlorhexidine gluconate] Hives 07/08/2019    Vitals: BP 121/81   Pulse 73   Ht 5\' 6"  (1.676 m)   Wt 235 lb (106.6 kg)   BMI 37.93 kg/m  Last Weight:  Wt Readings from Last 1 Encounters:  02/04/23 235 lb (106.6 kg)   Last Height:   Ht Readings from Last 1 Encounters:  02/04/23 5\' 6"  (1.676 m)    Physical exam: Exam: Gen:  NAD, conversant, well nourised, obese, well groomed                     CV: RRR, no MRG. No Carotid Bruits. No peripheral edema, warm, nontender Eyes: Conjunctivae clear without exudates or hemorrhage  Neuro: Detailed Neurologic Exam  Speech:    Speech is normal; fluent and spontaneous with normal comprehension.  Cognition:    The patient is oriented to person, place, and time;     recent and remote memory intact;     language fluent;     normal attention, concentration,     fund of knowledge Cranial Nerves:    The pupils are equal, round, and reactive to light. 1+ ONH edema right > left. Visual fields are full. Extraocular movements are intact. Trigeminal sensation is intact and the muscles of mastication are normal. The face is symmetric. The palate elevates in the midline. Hearing intact. Voice is normal. Shoulder shrug is normal. The tongue has normal motion without fasciculations.   Coordination: nml  Gait: nml  Motor Observation:    No asymmetry, no atrophy, and no involuntary movements noted. Tone:    Normal muscle tone.    Posture:    Posture is normal. normal erect    Strength:    Strength is V/V in the upper and lower limbs.      Sensation: intact to LT     Reflex Exam:  DTR's:    Deep tendon reflexes in the upper and lower extremities are symmetrical bilaterally.   Toes:    The toes are downgoing bilaterally.   Clonus:    Clonus is absent.     Assessment/Plan:  Patient with chronic daily headaches, optic nerve head edema and migraines. MRI brain/CTA/CTV head unremarkable(MRI partially empty sella. ), opening pressure 26, ONH 1+ papilledema stable but has worsening headaches(vision and ophthalmology exam stable) , intolerance to Topiramate IR changed to ER. She had a hysterectomy. She has migraines but these are not migraines, Headache are not migrainous she has "brain freeze" headaches, constant, pressure and worsens with laughing or coughing, bending  over.  -Opening pressure was 26 consistent with IDIOPATHIC INTRACRANIAL HYPERTENSION.  - Had sleep evaluation mild OSA getting an oral appliance - She is on Trokendi 300mg  continue edema stable, may need to change to or start adding acetazolamide. Would add on acetazolamide and decrease Topiramate ER she is having side effects - repeat eye exam in August 2014 stable, next appointment December we will see her after that (Hecker's notes: reviewed 07/27/2022: OCT RNFL(91/97) improved since 04/2022(96/103) grade 1 edema OU improving, feeling better, only one headache during  the storm and eclipse, all is going well. She took fioricet and it helped. Doing well on Topiramate ER. ), 04/2023: December 31, 2022 optic nerves stable in appearance on exam and testing, grade 1 edema OU, symptoms of IIH worse, headaches/pressure when bending over,recommend increase topiramate to 300 mg daily(completed 2 days after this exam) -Weight loss is critical, discussed weight loss and risk of permanent vision loss, seeing healthy weight and wellness - CTV for venous thrombosis  No evidence of intracranial venous thrombosis,  No convincing dural venous sinus stenosis. -  At this time we will order LP as she has had great response in the past and see what upcoming ophthalmology appointment shows - Possibly having side effects to high dose topiramate fatigue especially. Lp brought relief for a year and can check pressure as well and remove enough fluid to normalize closing pressure. If that does not work we can add or change to acetazolamide.  - hasn't taken a fioricet in 6 months, we can give her 14 tabs she doesn't use often at all   Hecker's notes: reviewed 07/27/2022: IMPROVED grade 1 papilledema. OCT RNFL(91/97) improved since 04/2022(96/103) grade 1 edema OU improving, feeling better, only one headache during the storm and eclipse, all is going well. She took fioricet and it helped. Doing well on Topiramate ER. )  04/2023:  December 31, 2022 optic nerves stable in appearance on exam and testing, grade 1 edema OU, symptoms of IIH worse, headaches/pressure when bending over,recommend increase topiramate to 300 mg daily(completed 2 days after this exam)  I reviewed notes from [facility] from January 12, 2022, at that time improved, [place] OD 2020 and OS 2020 at that time she had just started Topamax and her headaches were worse OCT RNFL and exam showed worsening papilledema OS greater than OD, and she had not had her lumbar puncture yet.  I called Hecker's office and received more recent notes from January 26, 2022.,  At that time distance was still 2020 OD and OS corrected, headaches are improved she does get some brain freeze headaches when it is cold, on exam pupils are equally round and reactive to light, there was no APD, extraocular movements were full, normal adnexa, anterior chamber was deep and quiet, posterior examination showed no hemorrhages no holes or tears, she still had grade 1 nerve swelling OD and grade 1 nerve swelling OS, no ptosis was noted on this exam and no anisocoria was noted stated that pupils were equally round and reactive to light so I am less inclined to keep Horner syndrome in the differential.  We will continue her medication as is, they will recheck dilated exam in 3 to 4 months with fundi photos, at that time if no improvement will need to check topiramate levels, ensure compliance, and possibly discuss either increase in topiramate or changing management.  04/2023: December 31, 2022 optic nerves stable in appearance on exam and testing, grade 1 edema OU, symptoms of IIH worse, headaches/pressure when bending over,recommend increase topiramate to 300 mg daily(completed 2 days after this exam)  No orders of the defined types were placed in this encounter.    For any acute change especially worsening headache or vision loss call 911 and proceed to ED  To prevent or relieve headaches, try  the following: Cool Compress. Lie down and place a cool compress on your head.  Avoid headache triggers. If certain foods or odors seem to have triggered your migraines in the past, avoid them. A  headache diary might help you identify triggers.  Include physical activity in your daily routine. Try a daily walk or other moderate aerobic exercise.  Manage stress. Find healthy ways to cope with the stressors, such as delegating tasks on your to-do list.  Practice relaxation techniques. Try deep breathing, yoga, massage and visualization.  Eat regularly. Eating regularly scheduled meals and maintaining a healthy diet might help prevent headaches. Also, drink plenty of fluids.  Follow a regular sleep schedule. Sleep deprivation might contribute to headaches Consider biofeedback. With this mind-body technique, you learn to control certain bodily functions -- such as muscle tension, heart rate and blood pressure -- to prevent headaches or reduce headache pain.    Proceed to emergency room if you experience new or worsening symptoms or symptoms do not resolve, if you have new neurologic symptoms or if headache is severe, or for any concerning symptom.   Provided education and documentation from American headache Society toolbox including articles on: pseudotumoer cerebri(IIH), chronic migraine medication overuse headache, chronic migraines, prevention of migraines and other headaches, behavioral and other nonpharmacologic treatments for headache.  No orders of the defined types were placed in this encounter.    Cc: Virl Diamond, MD,  Virl Diamond, MD  Naomie Dean, MD  I spent over 30 minutes of face-to-face and non-face-to-face time with patient on the  1. IIH (idiopathic intracranial hypertension)   2. Papilledema   3. Other headache syndrome   4. Chronic daily headache    diagnosis.  This included previsit chart review, lab review, study review, order entry, electronic health record  documentation, patient education on the different diagnostic and therapeutic options, counseling and coordination of care, risks and benefits of management, compliance, or risk factor reduction   [facility] [address] [place], [place] [address]  Phone [phone] Fax [phone]  I spent 30 minutes of face-to-face and non-face-to-face time with patient on the  1. IIH (idiopathic intracranial hypertension)   2. Papilledema   3. Other headache syndrome   4. Chronic daily headache     diagnosis.  This included previsit chart review, lab review, study review, order entry, electronic health record documentation, patient education on the different diagnostic and therapeutic options, counseling and coordination of care, risks and benefits of management, compliance, or risk factor reduction

## 2023-02-04 NOTE — Patient Instructions (Addendum)
Lumbar Puncture If improvement can continue Topiramate If no improvement can add/switch to Diamox(Acetazolamide)  Asked for sedation at [facility] otherwise will provide percocet for procedure

## 2023-02-17 ENCOUNTER — Encounter: Payer: Self-pay | Admitting: Neurology

## 2023-02-18 NOTE — Telephone Encounter (Signed)
I LVM on Nicole Harper's VM at GI.

## 2023-02-19 ENCOUNTER — Ambulatory Visit: Payer: 59 | Admitting: Family Medicine

## 2023-02-19 ENCOUNTER — Encounter: Payer: Self-pay | Admitting: Family Medicine

## 2023-02-19 VITALS — BP 135/86 | HR 64 | Temp 97.8°F | Ht 66.0 in | Wt 231.0 lb

## 2023-02-19 DIAGNOSIS — E66812 Obesity, class 2: Secondary | ICD-10-CM

## 2023-02-19 DIAGNOSIS — F3289 Other specified depressive episodes: Secondary | ICD-10-CM

## 2023-02-19 DIAGNOSIS — E88819 Insulin resistance, unspecified: Secondary | ICD-10-CM

## 2023-02-19 DIAGNOSIS — E559 Vitamin D deficiency, unspecified: Secondary | ICD-10-CM

## 2023-02-19 DIAGNOSIS — I10 Essential (primary) hypertension: Secondary | ICD-10-CM

## 2023-02-19 DIAGNOSIS — Z6837 Body mass index (BMI) 37.0-37.9, adult: Secondary | ICD-10-CM

## 2023-02-19 MED ORDER — VITAMIN D (ERGOCALCIFEROL) 1.25 MG (50000 UNIT) PO CAPS: 50000.0000 [IU] | ORAL_CAPSULE | ORAL | 0 refills | Status: DC

## 2023-02-19 MED ORDER — BUPROPION HCL ER (SR) 150 MG PO TB12
150.0000 mg | ORAL_TABLET | Freq: Two times a day (BID) | ORAL | 0 refills | Status: DC
Start: 2023-02-19 — End: 2023-04-15

## 2023-02-19 NOTE — Telephone Encounter (Signed)
I called spine center @ [facility]. I was unable to LVM. Will try again tomorrow.

## 2023-02-19 NOTE — Assessment & Plan Note (Signed)
Improving Fasting insulin is improving Doing well with weight reduction, limiting starches and sweets Physical activity has been limited due to worsening IIH symptoms  Plan to ramp up walking time once headaches improve Continue to limit intake of high sugar foods and drinks

## 2023-02-19 NOTE — Assessment & Plan Note (Signed)
Last vitamin D Lab Results  Component Value Date   VD25OH 42.5 10/15/2022   Currently on vitamin D 50,000 IU once weekly.  Energy level has been stable  Repeat labs today

## 2023-02-19 NOTE — Progress Notes (Signed)
Office: [phone]  /  Fax: [phone]  WEIGHT SUMMARY AND BIOMETRICS  Starting Date: 12/27/21  Starting Weight: 252lb   Weight Lost Since Last Visit: 2lb   Vitals Temp: 97.8 F (36.6 C) BP: 135/86 Pulse Rate: 64 SpO2: 100 %   Body Composition  Body Fat %: 46.6 % Fat Mass (lbs): 107.8 lbs Muscle Mass (lbs): 117.2 lbs Total Body Water (lbs): 87.4 lbs Visceral Fat Rating : 12    HPI  Chief Complaint: OBESITY  Amber Cobb is here to discuss her progress with her obesity treatment plan. She is on the the Category 2 Plan and states she is following her eating plan approximately 80 % of the time. She states she is exercising 0 minutes 0 times per week.   Interval History:  Since last office visit she is down 2 lb She is losing muscle mass - less active due to IIH, awaiting an LP Her appetite has been lacking and she has been meal skipping due to her IIH She is down 21 lb in the past year of medically supervised weight management Emotional eating under good control on Buproprion SR 150 mg bid She is wearing an Oral device for sleep apnea She is tolerating metformin XR 500 mg daily  Pharmacotherapy: Wellbutrin SR 150 mg bid +Metformin XR 500 mg daily  PHYSICAL EXAM:  Blood pressure 135/86, pulse 64, temperature 97.8 F (36.6 C), height 5\' 6"  (1.676 m), weight 231 lb (104.8 kg), SpO2 100%. Body mass index is 37.28 kg/m.  General: She is overweight, cooperative, alert, well developed, and in no acute distress. PSYCH: Has normal mood, affect and thought process.   Lungs: Normal breathing effort, no conversational dyspnea.   ASSESSMENT AND PLAN  TREATMENT PLAN FOR OBESITY:  Recommended Dietary Goals  Amber Cobb is currently in the action stage of change. As such, her goal is to continue weight management plan. She has agreed to the Category 2 Plan.  Behavioral Intervention  We discussed the following Behavioral Modification Strategies today: increasing lean  protein intake to established goals, increasing fiber rich foods, increasing water intake , reading food labels , keeping healthy foods at home, and continue to work on maintaining a reduced calorie state, getting the recommended amount of protein, incorporating whole foods, making healthy choices, staying well hydrated and practicing mindfulness when eating..  Additional resources provided today: NA  Recommended Physical Activity Goals  Amber Cobb has been advised to work up to 150 minutes of moderate intensity aerobic activity a week and strengthening exercises 2-3 times per week for cardiovascular health, weight loss maintenance and preservation of muscle mass.   She has agreed to Think about enjoyable ways to increase daily physical activity and overcoming barriers to exercise and Increase physical activity in their day and reduce sedentary time (increase NEAT). - plan to increase walking time as headaches improve  Pharmacotherapy changes for the treatment of obesity: none  ASSOCIATED CONDITIONS ADDRESSED TODAY  Benign essential HTN Assessment & Plan: Blood pressure is well-controlled on losartan 50 mg once daily Denies chest pain Has headaches related to IIH  Continue current medications and update labs today  Orders: -     Comprehensive metabolic panel -     Lipid panel  Insulin resistance Assessment & Plan: Improving Fasting insulin is improving Doing well with weight reduction, limiting starches and sweets Physical activity has been limited due to worsening IIH symptoms  Plan to ramp up walking time once headaches improve Continue to limit intake of high sugar foods  and drinks  Orders: -     Insulin, random -     Hemoglobin A1c  Vitamin D deficiency Assessment & Plan: Last vitamin D Lab Results  Component Value Date   VD25OH 42.5 10/15/2022   Currently on vitamin D 50,000 IU once weekly.  Energy level has been stable  Repeat labs today  Orders: -     Vitamin  D (Ergocalciferol); Take 1 capsule (50,000 Units total) by mouth every 7 (seven) days.  Dispense: 12 capsule; Refill: 0 -     VITAMIN D 25 Hydroxy (Vit-D Deficiency, Fractures)  Other depression with emotional eating Assessment & Plan: Mood stable with good support system at home Emotional eating under good control on Wellbutrin  Orders: -     buPROPion HCl ER (SR); Take 1 tablet (150 mg total) by mouth 2 (two) times daily.  Dispense: 180 tablet; Refill: 0  Class 2 severe obesity due to excess calories with serious comorbidity and body mass index (BMI) of 37.0 to 37.9 in adult [facility])      She was informed of the importance of frequent follow up visits to maximize her success with intensive lifestyle modifications for her multiple health conditions.   ATTESTASTION STATEMENTS:  Reviewed by clinician on day of visit: allergies, medications, problem list, medical history, surgical history, family history, social history, and previous encounter notes pertinent to obesity diagnosis.   I have personally spent 30 minutes total time today in preparation, patient care, nutritional counseling and documentation for this visit, including the following: review of clinical lab tests; review of medical tests/procedures/services.      Glennis Brink, DO DABFM, DABOM [facility] [address] [place], [place] [address] [phone]

## 2023-02-19 NOTE — Assessment & Plan Note (Signed)
Blood pressure is well-controlled on losartan 50 mg once daily Denies chest pain Has headaches related to IIH  Continue current medications and update labs today

## 2023-02-19 NOTE — Assessment & Plan Note (Signed)
Mood stable with good support system at home Emotional eating under good control on Wellbutrin

## 2023-02-20 LAB — LIPID PANEL
Chol/HDL Ratio: 4.3 {ratio} (ref 0.0–4.4)
Cholesterol, Total: 212 mg/dL — ABNORMAL HIGH (ref 100–199)
HDL: 49 mg/dL (ref >39)
LDL Chol Calc (NIH): 112 mg/dL — ABNORMAL HIGH (ref 0–99)
Triglycerides: 299 mg/dL — ABNORMAL HIGH (ref 0–149)
VLDL Cholesterol Cal: 51 mg/dL — ABNORMAL HIGH (ref 5–40)

## 2023-02-20 LAB — COMPREHENSIVE METABOLIC PANEL
ALT: 15 [IU]/L (ref 0–32)
AST: 12 [IU]/L (ref 0–40)
Albumin: 4.1 g/dL (ref 3.9–4.9)
Alkaline Phosphatase: 65 [IU]/L (ref 44–121)
BUN/Creatinine Ratio: 18 (ref 9–23)
BUN: 16 mg/dL (ref 6–24)
Bilirubin Total: 0.3 mg/dL (ref 0.0–1.2)
CO2: 19 mmol/L — ABNORMAL LOW (ref 20–29)
Calcium: 9.1 mg/dL (ref 8.7–10.2)
Chloride: 102 mmol/L (ref 96–106)
Creatinine, Ser: 0.88 mg/dL (ref 0.57–1.00)
Globulin, Total: 2.7 g/dL (ref 1.5–4.5)
Glucose: 88 mg/dL (ref 70–99)
Potassium: 4 mmol/L (ref 3.5–5.2)
Sodium: 137 mmol/L (ref 134–144)
Total Protein: 6.8 g/dL (ref 6.0–8.5)
eGFR: 82 mL/min/{1.73_m2} (ref >59)

## 2023-02-20 LAB — VITAMIN D 25 HYDROXY (VIT D DEFICIENCY, FRACTURES): Vit D, 25-Hydroxy: 52.1 ng/mL (ref 30.0–100.0)

## 2023-02-20 LAB — INSULIN, RANDOM: INSULIN: 17.2 u[IU]/mL (ref 2.6–24.9)

## 2023-02-20 LAB — HEMOGLOBIN A1C
Est. average glucose Bld gHb Est-mCnc: 108 mg/dL
Hgb A1c MFr Bld: 5.4 % (ref 4.8–5.6)

## 2023-02-20 NOTE — Telephone Encounter (Addendum)
I LVM again on Amber Cobb's VM at GI.     I also sent a secure chat message to Henry.

## 2023-02-20 NOTE — Telephone Encounter (Signed)
Joni Reining at GI said she will call the patient today.

## 2023-02-22 NOTE — Discharge Instructions (Signed)

## 2023-02-24 ENCOUNTER — Other Ambulatory Visit: Payer: Self-pay | Admitting: Neurology

## 2023-02-24 MED ORDER — TRAMADOL HCL 50 MG PO TABS
50.0000 mg | ORAL_TABLET | Freq: Four times a day (QID) | ORAL | 0 refills | Status: DC | PRN
Start: 2023-02-24 — End: 2023-03-22

## 2023-02-25 ENCOUNTER — Ambulatory Visit
Admission: RE | Admit: 2023-02-25 | Discharge: 2023-02-25 | Disposition: A | Discharge status: Home / Self Care | Payer: 59 | Source: Ambulatory Visit | Attending: Neurology | Admitting: Neurology

## 2023-02-25 ENCOUNTER — Telehealth: Payer: Self-pay | Admitting: Neurology

## 2023-02-25 DIAGNOSIS — G932 Benign intracranial hypertension: Secondary | ICD-10-CM

## 2023-02-25 DIAGNOSIS — G4489 Other headache syndrome: Secondary | ICD-10-CM

## 2023-02-25 DIAGNOSIS — R519 Headache, unspecified: Secondary | ICD-10-CM

## 2023-02-25 DIAGNOSIS — H4710 Unspecified papilledema: Secondary | ICD-10-CM

## 2023-02-25 NOTE — Telephone Encounter (Signed)
I had forwarded message to Dr Lucia Gaskins. Will ask her then respond to patient asap.

## 2023-02-25 NOTE — Telephone Encounter (Signed)
Pt states she has sent a my chart message on this request, but still wants a message sent this way as well re: wanting to know if in preporation for the LP she has coming up can she take the  traMADol (ULTRAM) 50 MG tablet , please call pt to discuss.

## 2023-03-05 ENCOUNTER — Telehealth: Payer: Self-pay

## 2023-03-05 NOTE — Telephone Encounter (Signed)
Minette Brine from [facility] called regarding scheduling a sleep study for this patient. Patient has dental device and is needing a follow up sleep study. Requesting a call back at [phone] to discuss.

## 2023-03-05 NOTE — Telephone Encounter (Signed)
As far as I know, we do not usually repeat sleep studies for patients after they receive an oral appliance, usually completed by dentistry but can also be completed by [facility] from my understanding. If this is something that we do, please let me know and I will place an order. Thank you!

## 2023-03-10 ENCOUNTER — Other Ambulatory Visit: Payer: Self-pay | Admitting: Neurology

## 2023-03-14 NOTE — Telephone Encounter (Signed)
Last fill 01-21-2023 #14 tablets.   Last seen 02-04-2023

## 2023-03-15 NOTE — Telephone Encounter (Signed)
This was a one time course. I will defer to Dr. Lucia Gaskins if she would like to continue it.

## 2023-03-28 ENCOUNTER — Inpatient Hospital Stay: Payer: 59 | Attending: Gynecologic Oncology | Admitting: Gynecologic Oncology

## 2023-03-28 ENCOUNTER — Other Ambulatory Visit: Payer: Self-pay

## 2023-03-28 ENCOUNTER — Encounter: Payer: Self-pay | Admitting: Gynecologic Oncology

## 2023-03-28 VITALS — BP 130/80 | HR 77 | Temp 98.4°F | Resp 20 | Wt 236.6 lb

## 2023-03-28 DIAGNOSIS — Z9079 Acquired absence of other genital organ(s): Secondary | ICD-10-CM | POA: Diagnosis not present

## 2023-03-28 DIAGNOSIS — Z8542 Personal history of malignant neoplasm of other parts of uterus: Secondary | ICD-10-CM | POA: Insufficient documentation

## 2023-03-28 DIAGNOSIS — Z90722 Acquired absence of ovaries, bilateral: Secondary | ICD-10-CM | POA: Insufficient documentation

## 2023-03-28 DIAGNOSIS — Z90710 Acquired absence of both cervix and uterus: Secondary | ICD-10-CM | POA: Diagnosis not present

## 2023-03-28 DIAGNOSIS — C541 Malignant neoplasm of endometrium: Secondary | ICD-10-CM

## 2023-03-28 NOTE — Progress Notes (Signed)
Gynecologic Oncology Return Clinic Visit  03/28/23  Reason for Visit: surveillance visit in the setting of early-stage uterine cancer   Treatment History: Oncology History Overview Note  MSI-stable   Endometrial cancer (HCC)  05/06/2019 Initial Biopsy   [facility]: [facility] least [facility]   05/21/2019 Imaging   CT abdomen/pelvis: Endometrial thickening, compatible with reported history of endometrial cancer.  No evidence of metastatic disease in the abdomen or pelvis.  7 mm hypodensity in the uncinate process, indeterminate and likely benign.  MRI of the abdomen with and without contrast could be used to further evaluate.   06/17/2019 Surgery   TRH/BS, SLN: On EUA, mobile 8-10 cm uterus, no adnexal masses. On intra-abdominal entry, normal appearing liver edge, diaphragm and omentum. Normal small and large bowel, appendix, and colon. Uterus 10cm and bulbous at the fundus. Normal appearing bilateral adnexa. Mapping successful to bilateral SLNs; external iliac SLN somewhat enlarged. No intra-abdominal or pelvic evidence of disease.    06/17/2019 Initial Diagnosis   Endometrial cancer (HCC)   06/17/2019 Pathologic Stage   IA, grade 1, no LVSI, negative SLNs. IHC intact.  A. LYMPH NODE, SENTINEL, RIGHT EXTERNAL ILIAC, BIOPSY:  -  Benign adipose tissue  -  No lymphoid tissue identified   B. LYMPH NODE, SENTINEL, RIGHT OBTURATOR, BIOPSY:  -  No carcinoma identified in one lymph node (0/1)  -  See comment   C. LYMPH NODE, RIGHT OBTURATOR, BIOPSY:  -  No carcinoma identified in one lymph node (0/1)  -  See comment   D. LYMPH NODE, SENTINEL, LEFT EXTERNAL ILIAC, BIOPSY:  -  No carcinoma identified in one lymph node (0/1)  -  See comment   E. UTERUS, CERVIX, BILATERAL FALLOPIAN TUBES, HYSTERECTOMY WITH  SALPINGECTOMY:  Uterus:  -  Endometrioid carcinoma, FIGO grade 1  -  Carcinoma confined to the endometrium  -  See oncology table and comment below   Cervix:  -  No carcinoma identified   Bilateral  Fallopian tubes:  -  Benign fallopian tubes with paratubal cysts  -  No carcinoma identified   COMMENT:   A.  Dr. Kenard Gower reviewed the case and agrees with the above diagnosis.   B-D.  Cytokeratin AE1/3 was performed on the sentinel lymph nodes to  exclude micrometastasis.  There is no evidence of metastatic carcinoma  by immunohistochemistry.   ONCOLOGY TABLE:   UTERUS, CARCINOMA OR CARCINOSARCOMA   Procedure: Total hysterectomy and bilateral salpingectomy  Histologic type: Endometrioid carcinoma  Histologic Grade: FIGO grade 1  Myometrial invasion: Carcinoma limited to the endometrium  Uterine Serosa Involvement: Not identified  Cervical stromal involvement: Not identified  Extent of involvement of other organs: Not identified  Lymphovascular invasion: Not identified  Regional Lymph Nodes:       Examined:     3 Sentinel                               0 non-sentinel                               3 total        Lymph nodes with metastasis: 0        Isolated tumor cells (<0.2 mm): 0        Micrometastasis:  (>0.2 mm and < 2.0 mm): 0        Macrometastasis: (>2.0 mm): 0  Extracapsular extension: N/A  Representative Tumor Block: E4  MMR / MSI testing: Will be ordered  Pathologic Stage Classification (pTNM, AJCC 8th edition):  pTIa, pN0  Comments: See above      Interval History: Doing well.  Denies any vaginal bleeding or discharge.  Denies pelvic or abdominal pain.  Reports baseline bowel bladder function.  Past Medical/Surgical History: Past Medical History:  Diagnosis Date   Anemia    Anxiety    Back pain    Carpal tunnel syndrome    Complex atypical endometrial hyperplasia    Depression    Diverticulosis    Edema of both lower extremities    Endometrial cancer (HCC)    Gallbladder disorder    Gallstones    Generalized anxiety disorder    GERD (gastroesophageal reflux disease)    History of kidney stones    Hypertension    Joint pain    Migraines     Obesity    UTI (urinary tract infection)     Past Surgical History:  Procedure Laterality Date   CARPAL TUNNEL RELEASE Bilateral    CHOLECYSTECTOMY     ENDOMETRIAL BIOPSY  05/05/2019   ROBOTIC ASSISTED TOTAL HYSTERECTOMY WITH BILATERAL SALPINGO OOPHERECTOMY N/A 06/17/2019   Procedure: XI ROBOTIC ASSISTED TOTAL HYSTERECTOMY WITH BILATERAL SALPINGECTOMY AND  LYMPH NODE DISSECTION;  Surgeon: Carver Fila, MD;  Location: [facility];  Service: Gynecology;  Laterality: N/A;   SENTINEL NODE BIOPSY N/A 06/17/2019   Procedure: SENTINEL NODE BIOPSY;  Surgeon: Carver Fila, MD;  Location: [facility];  Service: Gynecology;  Laterality: N/A;    Family History  Problem Relation Age of Onset   Obesity Mother    Anxiety disorder Mother    Depression Mother    Hypertension Mother    Diabetes Mother    COPD Mother    Thyroid disease Mother    Migraines Mother    High Cholesterol Mother    Sleep apnea Mother    Obesity Father    Thyroid disease Sister    Migraines Sister    Thyroid disease Brother    Endometrial cancer Maternal Aunt    Lung cancer Maternal Uncle    Throat cancer Maternal Grandfather    Colon cancer Neg Hx    Esophageal cancer Neg Hx    Pancreatic cancer Neg Hx    Colon polyps Neg Hx     Social History   Socioeconomic History   Marital status: Married    Spouse name: Financial trader   Number of children: 2   Years of education: Not on file   Highest education level: Associate degree: occupational, Scientist, product/process development, or vocational program  Occupational History   Not on file  Tobacco Use   Smoking status: Never   Smokeless tobacco: Never  Vaping Use   Vaping status: Never Used  Substance and Sexual Activity   Alcohol use: Not Currently   Drug use: Never   Sexual activity: Yes  Other Topics Concern   Not on file  Social History Narrative   Lives at home with husband and children   R handed   Caffeine: none   Social  Determinants of Health   Financial Resource Strain: Not on file  Food Insecurity: No Food Insecurity (05/05/2019)   Received from [facility], [facility]   Hunger Vital Sign    Worried About Running Out of Food in the Last Year: Never true    Ran Out of Food in the  Last Year: Never true  Transportation Needs: No Transportation Needs (05/05/2019)   Received from [facility], [facility]   [facility] - Transportation    Lack of Transportation (Medical): No    Lack of Transportation (Non-Medical): No  Physical Activity: Insufficiently Active (09/26/2022)   [facility], [facility]   Exercise Vital Sign    Days of Exercise per Week: 3 days    Minutes of Exercise per Session: 30 min  Stress: Stress Concern Present (09/26/2022)   Received from [facility], [facility] - Occupational Stress Questionnaire    Feeling of Stress : To some extent  Social Connections: Not on file    Current Medications:  Current Outpatient Medications:    buPROPion (WELLBUTRIN SR) 150 MG 12 hr tablet, Take 1 tablet (150 mg total) by mouth 2 (two) times daily., Disp: 180 tablet, Rfl: 0   butalbital-acetaminophen-caffeine (FIORICET) 50-325-40 MG tablet, Take 1 tablet by mouth every 4 (four) hours as needed for headache., Disp: 14 tablet, Rfl: 0   loratadine (CLARITIN) 10 MG tablet, Take 10 mg by mouth daily., Disp: , Rfl:    losartan (COZAAR) 50 MG tablet, Take 1 tablet by mouth once daily, Disp: 90 tablet, Rfl: 0   Melatonin 5 MG CAPS, Take by mouth., Disp: , Rfl:    metFORMIN (GLUCOPHAGE-XR) 500 MG 24 hr tablet, Take 1 tablet (500 mg total) by mouth daily with breakfast., Disp: 90 tablet, Rfl: 0   modafinil (PROVIGIL) 200 MG tablet, Take 1 tablet (200 mg total) by mouth daily., Disp: 30 tablet, Rfl: 2   Multiple Vitamin (MULTIVITAMIN) capsule, Take 1 capsule by mouth daily., Disp: , Rfl:    topiramate (TOPAMAX) 100 MG tablet,  Take 3 tablets (300 mg total) by mouth at bedtime., Disp: 270 tablet, Rfl: 3   Vitamin D, Ergocalciferol, (DRISDOL) 1.25 MG (50000 UNIT) CAPS capsule, Take 1 capsule (50,000 Units total) by mouth every 7 (seven) days., Disp: 12 capsule, Rfl: 0  Review of Systems: + migraine Denies appetite changes, fevers, chills, fatigue, unexplained weight changes. Denies hearing loss, neck lumps or masses, mouth sores, ringing in ears or voice changes. Denies cough or wheezing.  Denies shortness of breath. Denies chest pain or palpitations. Denies leg swelling. Denies abdominal distention, pain, blood in stools, constipation, diarrhea, nausea, vomiting, or early satiety. Denies pain with intercourse, dysuria, frequency, hematuria or incontinence. Denies hot flashes, pelvic pain, vaginal bleeding or vaginal discharge.   Denies joint pain, back pain or muscle pain/cramps. Denies itching, rash, or wounds. Denies dizziness, numbness or seizures. Denies swollen lymph nodes or glands, denies easy bruising or bleeding. Denies anxiety, depression, confusion, or decreased concentration.  Physical Exam: BP (!) 148/86 (BP Location: Right Arm, Patient Position: Sitting)   Pulse 77   Temp 98.4 F (36.9 C) (Oral)   Resp 20   Wt 236 lb 9.6 oz (107.3 kg)   LMP 06/15/2019   SpO2 100%   BMI 38.19 kg/m  General: Alert, oriented, no acute distress. HEENT: Normocephalic, atraumatic sclera anicteric. Chest: Clear to auscultation bilaterally.  No wheezes or rhonchi. Cardiovascular: Regular rate and rhythm, no murmurs or rubs. Abdomen: Obese, soft, nontender.  Normoactive bowel sounds.  No masses or hepatosplenomegaly appreciated.  Well-healed incisions. Extremities: Grossly normal range of motion.  Warm, well perfused.  No edema bilaterally. Skin: No rashes or lesions noted. Lymphatics: No cervical, supraclavicular, or inguinal adenopathy. GU: Normal appearing external genitalia  without erythema, excoriation, or  lesions.  Speculum exam reveals well rugated vaginal mucosa, no lesions or masses noted.  No bleeding or discharge.  Bimanual exam reveals cuff intact, no nodularity or masses.    Laboratory & Radiologic Studies: None new  Assessment & Plan: Amber Cobb is a 47 y.o. woman with Stage IA1, grade 1 endometrioid endometrial adenocarcinoma, MSI stable, who presents for surveillance visit today.  Surgery 06/2019.   Patient is overall doing well and is NED on exam today.   From a cancer surveillance standpoint, we will continue with visits every 6 months per NCCN surveillance recommendations.  Given her early stage, low risk disease, we are alternating visits between my office and her OB/GYN.  I will plan to see her for surveillance visit in 1 year.  I reviewed with patient the symptoms that would be concerning for disease recurrence that should prompt a phone call before her next scheduled visit.    20 minutes of total time was spent for this patient encounter, including preparation, face-to-face counseling with the patient and coordination of care, and documentation of the encounter.  Eugene Garnet, MD  Division of Gynecologic Oncology  Department of Obstetrics and Gynecology  [facility]

## 2023-03-28 NOTE — Patient Instructions (Signed)
It was good to see you today.  I do not see or feel any evidence of cancer recurrence on your exam.  I will see you for follow-up in 12 months.  Please reach out to your OB/GYN to schedule a visit in 6 months and call my office sometime over the summer next year to schedule a visit to see me in November.  As always, if you develop any new and concerning symptoms before your next visit, please call to see me sooner.

## 2023-03-29 ENCOUNTER — Other Ambulatory Visit (INDEPENDENT_AMBULATORY_CARE_PROVIDER_SITE_OTHER): Payer: Self-pay | Admitting: Family Medicine

## 2023-03-29 DIAGNOSIS — I10 Essential (primary) hypertension: Secondary | ICD-10-CM

## 2023-04-15 ENCOUNTER — Ambulatory Visit: Payer: 59 | Admitting: Family Medicine

## 2023-04-15 ENCOUNTER — Encounter: Payer: Self-pay | Admitting: Family Medicine

## 2023-04-15 VITALS — BP 129/85 | HR 69 | Temp 97.7°F | Ht 66.0 in | Wt 233.0 lb

## 2023-04-15 DIAGNOSIS — E88819 Insulin resistance, unspecified: Secondary | ICD-10-CM

## 2023-04-15 DIAGNOSIS — G932 Benign intracranial hypertension: Secondary | ICD-10-CM

## 2023-04-15 DIAGNOSIS — F3289 Other specified depressive episodes: Secondary | ICD-10-CM | POA: Diagnosis not present

## 2023-04-15 DIAGNOSIS — I10 Essential (primary) hypertension: Secondary | ICD-10-CM | POA: Diagnosis not present

## 2023-04-15 DIAGNOSIS — E559 Vitamin D deficiency, unspecified: Secondary | ICD-10-CM | POA: Diagnosis not present

## 2023-04-15 DIAGNOSIS — Z6837 Body mass index (BMI) 37.0-37.9, adult: Secondary | ICD-10-CM

## 2023-04-15 DIAGNOSIS — E66812 Obesity, class 2: Secondary | ICD-10-CM

## 2023-04-15 DIAGNOSIS — F411 Generalized anxiety disorder: Secondary | ICD-10-CM | POA: Insufficient documentation

## 2023-04-15 MED ORDER — VITAMIN D (ERGOCALCIFEROL) 1.25 MG (50000 UNIT) PO CAPS: 50000.0000 [IU] | ORAL_CAPSULE | ORAL | 0 refills | Status: DC

## 2023-04-15 MED ORDER — ESCITALOPRAM OXALATE 10 MG PO TABS: 10.0000 mg | ORAL_TABLET | Freq: Every day | ORAL | 1 refills | Status: DC

## 2023-04-15 MED ORDER — METFORMIN HCL ER 500 MG PO TB24: 500.0000 mg | ORAL_TABLET | Freq: Every day | ORAL | 0 refills | Status: DC

## 2023-04-15 MED ORDER — BUPROPION HCL ER (SR) 150 MG PO TB12: 150.0000 mg | ORAL_TABLET | Freq: Two times a day (BID) | ORAL | 0 refills | Status: DC

## 2023-04-15 MED ORDER — LOSARTAN POTASSIUM 50 MG PO TABS: 50.0000 mg | ORAL_TABLET | Freq: Every day | ORAL | 0 refills | Status: DC

## 2023-04-15 NOTE — Assessment & Plan Note (Signed)
Worsening She restarted an old RX for Lexapro taking 5 mg daily without much help for worsening anxiety this time of year.  Poor sleep also related to higher levels of anxiety.  Has a good support system at home  She may benefit from outside counseling Increase Lexapro to 10 mg daily

## 2023-04-15 NOTE — Progress Notes (Signed)
Office: [phone]  /  Fax: [phone]  WEIGHT SUMMARY AND BIOMETRICS  Starting Date: 12/27/21  Starting Weight: 252lb   Weight Lost Since Last Visit: 0lb   Vitals Temp: 97.7 F (36.5 C) BP: 129/85 Pulse Rate: 69 SpO2: 99 %   Body Composition  Body Fat %: 46.4 % Fat Mass (lbs): 108.4 lbs Muscle Mass (lbs): 118.8 lbs Total Body Water (lbs): 86.4 lbs Visceral Fat Rating : 12    HPI  Chief Complaint: OBESITY  Amber Cobb is here to discuss her progress with her obesity treatment plan. She is on the the Category 2 Plan and states she is following her eating plan approximately 50 % of the time. She states she is exercising 0 minutes 0 times per week.   Interval History:  Since last office visit she is up 2 lb This gives her a net weight loss of 19 lb in the past 15 mos of medically supervised weight management She has been skipping meals because 'eating meals makes her too tired to function' during the day and this doesn't matter what she eats or how much she eats She is typically having one meal in the evening, eating out sometimes and may have a snack at night Struggling more with anxiety, poor sleep, no energy She started herself back on Lexapro (5 mg) with minimal improvements Averaging 4-5 hrs of sleep at night wearing oral appliance Doing ADLs but no regular exercise, limited by IIH  Pharmacotherapy: metformin XR 500 mg daily, Wellbutrin SR 150 mg bid  PHYSICAL EXAM:  Blood pressure 129/85, pulse 69, temperature 97.7 F (36.5 C), height 5\' 6"  (1.676 m), weight 233 lb (105.7 kg), last menstrual period 06/15/2019, SpO2 99%. Body mass index is 37.61 kg/m.  General: She is overweight, cooperative, alert, well developed, and in no acute distress. PSYCH: Has normal mood, affect and thought process.   Lungs: Normal breathing effort, no conversational dyspnea.   ASSESSMENT AND PLAN  TREATMENT PLAN FOR OBESITY:  Recommended Dietary Goals  Emmery is  currently in the action stage of change. As such, her goal is to continue weight management plan. She has agreed to practicing portion control and making smarter food choices, such as increasing vegetables and decreasing simple carbohydrates. - she may incorporate intermittent fasting with water, coffee + splash of half n half, bone broth during fasting.  1st meal of the day should be lean protein + starch + veggie and should not be high in fat.  Avoid high sugar snacks.  Reviewed high protein options.  Behavioral Intervention  We discussed the following Behavioral Modification Strategies today: increasing lean protein intake to established goals, decreasing simple carbohydrates , increasing vegetables, increasing water intake , keeping healthy foods at home, identifying sources and decreasing liquid calories, work on managing stress, creating time for self-care and relaxation, planning for success, and continue to work on maintaining a reduced calorie state, getting the recommended amount of protein, incorporating whole foods, making healthy choices, staying well hydrated and practicing mindfulness when eating..  Additional resources provided today: NA  Recommended Physical Activity Goals  [name] has been advised to work up to 150 minutes of moderate intensity aerobic activity a week and strengthening exercises 2-3 times per week for cardiovascular health, weight loss maintenance and preservation of muscle mass.   She has agreed to Think about enjoyable ways to increase daily physical activity and overcoming barriers to exercise and Increase physical activity in their day and reduce sedentary time (increase NEAT).  Pharmacotherapy changes  for the treatment of obesity: increase Lexapro to 10 mg daily  ASSOCIATED CONDITIONS ADDRESSED TODAY  Essential hypertension Assessment & Plan: BP well controlled on Losartan 50 mg daily.  Continue Losartan 50 mg daily and continue to work on weight  reduction  Orders: -     Losartan Potassium; Take 1 tablet (50 mg total) by mouth daily.  Dispense: 90 tablet; Refill: 0  Insulin resistance Assessment & Plan: Stable Reviewed labs from last visit Doing well on metformin XR 500 mg once daily with food without adverse SE Working on reducing intake of sugar and starches  She may use intermittent fasting to help with IR and continue metformin XR 500 mg daily   Orders: -     metFORMIN HCl ER; Take 1 tablet (500 mg total) by mouth daily with breakfast.  Dispense: 90 tablet; Refill: 0  Vitamin D deficiency Assessment & Plan: Last vitamin D Lab Results  Component Value Date   VD25OH 52.1 02/19/2023   Reviewed labs from last visit Taking RX vitamin D weekly Energy level has improved  Continue RX vitamin D weekly Repeat lab in 3 mos  Orders: -     Vitamin D (Ergocalciferol); Take 1 capsule (50,000 Units total) by mouth every 7 (seven) days.  Dispense: 12 capsule; Refill: 0  Other depression with emotional eating -     buPROPion HCl ER (SR); Take 1 tablet (150 mg total) by mouth 2 (two) times daily.  Dispense: 180 tablet; Refill: 0 -     Escitalopram Oxalate; Take 1 tablet (10 mg total) by mouth at bedtime.  Dispense: 30 tablet; Refill: 1  Class 2 obesity due to excess calories with body mass index (BMI) of 37.0 to 37.9 in adult, unspecified whether serious comorbidity present  IIH (idiopathic intracranial hypertension) Assessment & Plan: Daily headaches, fatigue and poor sleep continue to hinder her weight loss progress She is managed by Dr Lucia Gaskins  F/u with Dr Lucia Gaskins re: poor sleep with daily headaches   GAD (generalized anxiety disorder) Assessment & Plan: Worsening She restarted an old RX for Lexapro taking 5 mg daily without much help for worsening anxiety this time of year.  Poor sleep also related to higher levels of anxiety.  Has a good support system at home  She may benefit from outside counseling Increase Lexapro  to 10 mg daily        She was informed of the importance of frequent follow up visits to maximize her success with intensive lifestyle modifications for her multiple health conditions.   ATTESTASTION STATEMENTS:  Reviewed by clinician on day of visit: allergies, medications, problem list, medical history, surgical history, family history, social history, and previous encounter notes pertinent to obesity diagnosis.   I have personally spent 30 minutes total time today in preparation, patient care, nutritional counseling and documentation for this visit, including the following: review of clinical lab tests; review of medical tests/procedures/services.      Glennis Brink, DO DABFM, DABOM [facility] [address] [place], [place] [address] [phone]

## 2023-04-15 NOTE — Assessment & Plan Note (Signed)
Last vitamin D Lab Results  Component Value Date   VD25OH 52.1 02/19/2023   Reviewed labs from last visit Taking RX vitamin D weekly Energy level has improved  Continue RX vitamin D weekly Repeat lab in 3 mos

## 2023-04-15 NOTE — Assessment & Plan Note (Signed)
Stable Reviewed labs from last visit Doing well on metformin XR 500 mg once daily with food without adverse SE Working on reducing intake of sugar and starches  She may use intermittent fasting to help with IR and continue metformin XR 500 mg daily

## 2023-04-15 NOTE — Assessment & Plan Note (Signed)
Daily headaches, fatigue and poor sleep continue to hinder her weight loss progress She is managed by Dr Lucia Gaskins  F/u with Dr Lucia Gaskins re: poor sleep with daily headaches

## 2023-04-15 NOTE — Assessment & Plan Note (Signed)
BP well controlled on Losartan 50 mg daily.  Continue Losartan 50 mg daily and continue to work on weight reduction

## 2023-04-30 ENCOUNTER — Telehealth: Payer: Self-pay | Admitting: Neurology

## 2023-04-30 NOTE — Telephone Encounter (Signed)
LVM and sent mychart msg informing pt of need to reschedule 05/16/23 appt - MD out   If pt calls back to reschedule, you can offer a slot with Dr. Lucia Gaskins on 05/24/23

## 2023-05-02 ENCOUNTER — Ambulatory Visit: Payer: 59 | Admitting: Neurology

## 2023-05-07 ENCOUNTER — Telehealth: Payer: Self-pay | Admitting: Neurology

## 2023-05-07 ENCOUNTER — Other Ambulatory Visit: Payer: Self-pay | Admitting: Neurology

## 2023-05-07 DIAGNOSIS — G43901 Migraine, unspecified, not intractable, with status migrainosus: Secondary | ICD-10-CM

## 2023-05-07 MED ORDER — METHYLPREDNISOLONE 4 MG PO TBPK: ORAL_TABLET | ORAL | 1 refills | Status: DC

## 2023-05-07 MED ORDER — BUTALBITAL-APAP-CAFFEINE 50-325-40 MG PO TABS: 1.0000 | ORAL_TABLET | Freq: Three times a day (TID) | ORAL | 0 refills | Status: DC

## 2023-05-07 MED ORDER — PROCHLORPERAZINE MALEATE 10 MG PO TABS: 10.0000 mg | ORAL_TABLET | Freq: Three times a day (TID) | ORAL | 11 refills | Status: DC

## 2023-05-07 NOTE — Telephone Encounter (Signed)
Lovely patient. Referred to got to ED. She appears in NAD, fluent, good historian. She went to the doctor last week, she had a bacterial infection finishing up bactrim , she is also in the week before Ajovy is due. 1 week ago migraine started becoming more persistent and started with the URI, ongoing since the 16th, headache every day but not consistent until a day ago a prolonged all day headachesand she states she forgot to follow up with me with me and has been taking tylenol, she followed up with pcp on call and they suggested ibuprofen and continuing to treat her infection she is still on the antibiotic bacterial 2 more days. Headache was all day yesterday but improved today. She did not inform our office about this week-long headache until she paged the on-call tonight reassured her she can always contact us sooner and we will help with toradol injection or IV infusion, we are here to help very nice patient always. She has throbbing, light sensitivity, tried using an ice-pack and a warm compress which help but don;t make the migraine go away completely. Right this moment her headache is "ok" just under the surface right now a 2/10 for several hours but it has been a 3-6 earlier. Waxes and wanes. No new symptoms, appears to be her typical migraine exacerbated by URI.Marland Kitchen She has been doing extremely well with ajovy but migraine is in the setting of ilnes with improved URI infection ssymptoms. No new vision problems, no numbness (tingling, no neck pain or stiff(not meningeal), no numbness/tingling/weakness/ dificulty speaking, no confusion, last OP normal. she has an upcoming eye doctor appointment. She is improving as far her URI and the headache is mild she still cannot get over the persistent headache and right now luckily 2/10 but waxes and wanes. Pulsating, light sensitivity, nausea, typical migraine. Try limited fioricet with compazine for 2 days and start a medrol dosepak. For worseing or new symptoms go to  ED.  If patient calls again over the weekend and there is another on-call doctor please text my cell phone thanks  No orders of the defined types were placed in this encounter.

## 2023-05-08 ENCOUNTER — Other Ambulatory Visit: Payer: Self-pay | Admitting: Neurology

## 2023-05-09 ENCOUNTER — Telehealth: Payer: Self-pay | Admitting: Neurology

## 2023-05-09 NOTE — Telephone Encounter (Signed)
Error

## 2023-05-16 ENCOUNTER — Ambulatory Visit: Payer: 59 | Admitting: Neurology

## 2023-05-23 ENCOUNTER — Encounter: Payer: Self-pay | Admitting: Neurology

## 2023-05-23 NOTE — Progress Notes (Signed)
 [facility]    Provider:  Dr Cobb Requesting Provider: Hollis Alan ORN, MD Primary Care Provider:  Hollis Alan ORN, MD  Virtual Visit via Video Note  I connected with Amber Cobb on 05/27/23 at  9:15 AM EST by a video enabled telemedicine application and verified that I am speaking with the correct person using two identifiers.  Location: Patient: home Provider: office   I discussed the limitations of evaluation and management by telemedicine and the availability of in person appointments. The patient expressed understanding and agreed to proceed.    Follow Up Instructions:    I discussed the assessment and treatment plan with the patient. The patient was provided an opportunity to ask questions and all were answered. The patient agreed with the plan and demonstrated an understanding of the instructions.   The patient was advised to call back or seek an in-person evaluation if the symptoms worsen or if the condition fails to improve as anticipated.  I provided over 20 minutes of non-face-to-face time during this encounter.   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 They changed her prescription for her glasses.  Get [facility] notes: pending  Patient complains of symptoms per HPI as well as the following symptoms: none . Pertinent negatives and positives per HPI. All others negative .   02/04/2023: We did increase her Topiramate  to 300mg  based on notes from ophthalmology,12/31/2022, reviewed notes from [facility] December 31, 2022 optic nerves stable in appearance  on exam and testing, grade 1 edema OU, symptoms of IIH worse, headaches/pressure when bending over, patient on Topamax  200 mg a day recommend increasing to 300 mg a day.  We did increase to 300 mg on August 21st 2024.  This was after her last appointment.  At this time we will order LP as she has had great response in the past and see what upcoming appointment shows since we have changed her dose since she last saw ophthalmology. Possibly having side effects to high dose topiramate  fatigue especially. Headache are not migrainous she has brain freeze headaches, constant, pressure and if laughing or coughing.   Getting a mouthguard for sleep apnea today was pulling off her cpap. Cpap not helping her sinus issues, persistent cough. She went to [facility]. No problems with vision. Headaches are worse with coughing, bending over, pressure. Lp brought relief for a year and can check pressure as well and remove enough fluid to normalize closing pressure. If that does not work we can add or change to acetazolamide.   Patient complains of symptoms per HPI as well as the following symptoms: headache . Pertinent negatives and positives per HPI. All others negative   09/06/2022: She has a follow up with [facility] for cpap, doing well. She has lost 17 pounds. [facility]: reviewed 07/27/2022: OCT RNFL(91/97) improved since 04/2022(96/103) grade 1 edema OU improving, feeling better, only one headache during the storm and eclipse, all is going well. She took fioricet and it helped. Doing well on Topiramate  ER. Daily headaches resolved had them for 1.5 years and now 1 headache a month maybe  vision much improved. Last moderate to severe headache in 6 months.   Patient complains of symptoms per HPI as well as the following symptoms: none . Pertinent negatives and positives per HPI. All others negative  02/2022: cbc/cmp,tsh,hgba1c nml   Reviewed:  No results found for this or any previous visit (from  the past 2160 hours).     Latest Ref Rng & Units 02/19/2023   12:24 PM 10/15/2022   11:20 AM 12/27/2021   10:07 AM  CMP  Glucose 70 - 99 mg/dL 88  96  91   BUN 6 - 24 mg/dL 16  15  11    Creatinine 0.57 - 1.00 mg/dL 9.11  9.01  9.16   Sodium 134 - 144 mmol/L 137  139  139   Potassium 3.5 - 5.2 mmol/L 4.0  4.4  4.3   Chloride 96 - 106 mmol/L 102  105  104   CO2 20 - 29 mmol/L 19  19  18    Calcium 8.7 - 10.2 mg/dL 9.1  9.1  9.1   Total Protein 6.0 - 8.5 g/dL 6.8  7.0  7.5   Total Bilirubin 0.0 - 1.2 mg/dL 0.3  0.5  0.3   Alkaline Phos 44 - 121 IU/L 65  77  78   AST 0 - 40 IU/L 12  13  11    ALT 0 - 32 IU/L 15  14  14        Latest Ref Rng & Units 12/27/2021   10:07 AM 06/15/2019    2:37 PM  CBC  WBC 3.4 - 10.8 x10E3/uL 6.1  7.9   Hemoglobin 11.1 - 15.9 g/dL 86.2  86.5   Hematocrit 34.0 - 46.6 % 41.2  41.3   Platelets 150 - 450 x10E3/uL 320  405      05/21/2022: Her prescription is better she saw [facility] in December (I only have notes from 9/1) on 12/15 the ONH edema was improved per patient. On Topamax  and doing well. She was diagnosed with mild OSA and is using cpap. In 9/1 she had grade 2 papilledema. I need her notes from [facility] from December, will request. She I not having side effects on the extended release 200mg  trokendi . She has brain freeze headaches but not the all day every day headaches so headaches are better. No synope, sometimes a little dizziness on standing. Request [facility] notes. She sees [facility] in March so we will see her after that. Mild numbness or tingling but that is more in the cold. May be Raynaud or just normal vasoconstriction.   Patient complains of symptoms per HPI as well as the following symptoms: losing weight . Pertinent negatives and positives per HPI. 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 Had sleep evaluation and is getting a sleep study in September. She is on Topamax  100mg  BID and  she has been to the weight clinic and has lost 5 pounds too and the headache has eased up a bit. She has a lot more energy. Having fatigue with the topiramate  IR will change to ER and can take it all at night. She is seeing [facility] on the 1st again. She will contact me with the results and I'll get the notes. She was having daily headaches, doing much better or any in the last 2 weeks. MRI partially empty sella.   Patient complains of symptoms per HPI as well as the following symptoms: drowsiness due to medication . Pertinent  negatives and positives per HPI. All others negative   HPI:  Amber Cobb is a 48 y.o. 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  I reviewed had her eye care and she was seen for headache, having headaches that feel like a brain freeze will hurt really bad for a minute then go away, she has been going on for almost a year, patient states that her right upper lid is drooping, she has a past medical history of acid reflux, anemia, migraines, anxiety, depression, hypertension.  Examination showed visual acuity OD 20/25 and OS 20/20, pupils equally round and reactive to light no APD, extraocular movements full, it did show optic nerve edema OU and a mild ptosis, headaches going on for a year, no other neurologic symptoms, worse with hard laughing or bowel movement, they ordered an MRI of the head and neck to rule out masses and tumors, no ophthalmoplegia, discussed that she may need an LP as well.     She has a low level headache every day for 1.5 years. With pressure she gets worse symptoms of headaches, low level headache every day, mostly in the forehead, she snores heavily, headaches every day, she has a film over her eyes, she cannot lose weight, she has migraines those are different and not often, she started having migraines about 4 years ago and she was waking up  with them when she gets a migraine pulsating, pounding, light and sound sensitivity and nausea but the daily headaches are different. She feels pressure in ears, hears swishing in the ears. She has gained weight. No energy. She naps every day, 3x a day, daily headaches are mild to moderate, migraines can be severe 1-2x a week, ibuprofen . Ibuprofen  helps. Migraines for 4 years. She is not interested in this time with a triptan, ibuprofen  helps, discussed rebound headache with ibuprofen .No other focal neurologic deficits, associated symptoms, inciting events or modifiable factors.  Reviewed notes, labs and imaging from outside physicians, which showed:  CT angio of the neck: Showed normal vasculature of the neck, the right vertebral artery is diminutive and nondominant, normal variant, likely accounting for the findings on the prior MRI.  MRI of the neck soft tissue: Showed abnormal appearance of the right vertebral artery, neck CT was recommended as above and was unremarkable.  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 01/2021: tsh,cbc,cmp normal  Review of Systems: Patient complains of symptoms per HPI as well as the following symptoms headaches. Pertinent negatives and positives per HPI. All others negative.   Social History   Socioeconomic History   Marital status: Married    Spouse name: Evalene   Number of children: 2   Years of education: Not on file   Highest education level: Associate degree: occupational, scientist, product/process development, or vocational program  Occupational History   Not on file  Tobacco Use   Smoking status: Never   Smokeless tobacco: Never  Vaping Use   Vaping status: Never Used  Substance and Sexual Activity   Alcohol use: Not Currently   Drug use: Never   Sexual activity: Yes  Other Topics Concern   Not on file  Social History  Narrative   Lives at home with husband and children   R handed   Caffeine : none   Social Drivers of Health  Financial Resource Strain: Not on file  Food Insecurity: No Food Insecurity (05/05/2019)   Received from [facility], [facility]   Hunger Vital Sign    Worried About Running Out of Food in the Last Year: Never true    Ran Out of Food in the Last Year: Never true  Transportation Needs: No Transportation Needs (05/05/2019)   Received from [facility], [facility]   [facility] - Transportation    Lack of Transportation (Medical): No    Lack of Transportation (Non-Medical): No  Physical Activity: Insufficiently Active (09/26/2022)   Received from [facility], [facility]   Exercise Vital Sign    Days of Exercise per Week: 3 days    Minutes of Exercise per Session: 30 min  Stress: Stress Concern Present (09/26/2022)   Received from [facility], [facility] - Occupational Stress Questionnaire    Feeling of Stress : To some extent  Social Connections: Not on file  Intimate Partner Violence: Not At Risk (09/26/2022)   Received from [facility], [facility]   Humiliation, Afraid, Rape, and Kick questionnaire    Fear of Current or Ex-Partner: No    Emotionally Abused: No    Physically Abused: No    Sexually Abused: No    Family History  Problem Relation Age of Onset   Obesity Mother    Anxiety disorder Mother    Depression Mother    Hypertension Mother    Diabetes Mother    COPD Mother    Thyroid  disease Mother    Migraines Mother    High Cholesterol Mother    Sleep apnea Mother    Obesity Father    Thyroid  disease Sister    Migraines Sister    Thyroid  disease Brother    Endometrial cancer Maternal Aunt    Lung cancer Maternal Uncle    Throat cancer Maternal Grandfather    Colon cancer Neg Hx    Esophageal cancer Neg Hx    Pancreatic cancer Neg Hx    Colon polyps Neg Hx     Past  Medical History:  Diagnosis Date   Anemia    Anxiety    Back pain    Carpal tunnel syndrome    Complex atypical endometrial hyperplasia    Depression    Diverticulosis    Edema of both lower extremities    Endometrial cancer (HCC)    Gallbladder disorder    Gallstones    Generalized anxiety disorder    GERD (gastroesophageal reflux disease)    History of kidney stones    Hypertension    Joint pain    Migraines    Obesity    UTI (urinary tract infection)     Patient Active Problem List   Diagnosis Date Noted   GAD (generalized anxiety disorder) 04/15/2023   Polyphagia 07/16/2022   Morbid obesity (HCC) wtih starting BMI 40 06/25/2022   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   OSA (obstructive sleep apnea) 02/08/2022   Essential hypertension 02/08/2022   Sleep-disordered breathing 01/10/2022   Vitamin D  deficiency 01/10/2022   Insulin  resistance 01/10/2022   Depression 01/10/2022   Metabolic syndrome 01/02/2022   Excessive daytime sleepiness 12/19/2021   Chronic fatigue 12/19/2021   Snoring 12/19/2021   IIH (idiopathic intracranial hypertension) 11/06/2021   Chronic daily headache 11/06/2021   Endometrial cancer (HCC) 06/22/2019   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    Past  Surgical History:  Procedure Laterality Date   CARPAL TUNNEL RELEASE Bilateral    CHOLECYSTECTOMY     ENDOMETRIAL BIOPSY  05/05/2019   ROBOTIC ASSISTED TOTAL HYSTERECTOMY WITH BILATERAL SALPINGO OOPHERECTOMY N/A 06/17/2019   Procedure: XI ROBOTIC ASSISTED TOTAL HYSTERECTOMY WITH BILATERAL SALPINGECTOMY AND  LYMPH NODE DISSECTION;  Surgeon: Viktoria Comer SAUNDERS, MD;  Location: [facility];  Service: Gynecology;  Laterality: N/A;   SENTINEL NODE BIOPSY N/A 06/17/2019   Procedure: SENTINEL NODE BIOPSY;  Surgeon: Viktoria Comer SAUNDERS, MD;  Location: [facility];  Service: Gynecology;  Laterality: N/A;    Current Outpatient Medications   Medication Sig Dispense Refill   butalbital -acetaminophen -caffeine  (FIORICET) 50-325-40 MG tablet Take 1 tablet by mouth every 6 (six) hours as needed for headache. 10 tablet 3   buPROPion  (WELLBUTRIN  SR) 150 MG 12 hr tablet Take 1 tablet (150 mg total) by mouth 2 (two) times daily. 180 tablet 0   escitalopram  (LEXAPRO ) 10 MG tablet Take 1 tablet (10 mg total) by mouth at bedtime. 30 tablet 1   loratadine (CLARITIN) 10 MG tablet Take 10 mg by mouth daily.     losartan  (COZAAR ) 50 MG tablet Take 1 tablet (50 mg total) by mouth daily. 90 tablet 0   Melatonin 5 MG CAPS Take by mouth.     metFORMIN  (GLUCOPHAGE -XR) 500 MG 24 hr tablet Take 1 tablet (500 mg total) by mouth daily with breakfast. 90 tablet 0   modafinil  (PROVIGIL ) 200 MG tablet Take 1 tablet (200 mg total) by mouth daily. 30 tablet 2   Multiple Vitamin (MULTIVITAMIN) capsule Take 1 capsule by mouth daily.     topiramate  (TOPAMAX ) 100 MG tablet Take 3 tablets (300 mg total) by mouth at bedtime. 270 tablet 4   Vitamin D , Ergocalciferol , (DRISDOL ) 1.25 MG (50000 UNIT) CAPS capsule Take 1 capsule (50,000 Units total) by mouth every 7 (seven) days. 12 capsule 0   No current facility-administered medications for this visit.    Allergies as of 05/24/2023 - Review Complete 04/15/2023  Allergen Reaction Noted   Chloraprep one step [chlorhexidine  gluconate] Hives 07/08/2019    Vitals: LMP 06/15/2019  Last Weight:  Wt Readings from Last 1 Encounters:  04/15/23 233 lb (105.7 kg)   Last Height:   Ht Readings from Last 1 Encounters:  04/15/23 5' 6 (1.676 m)   Physical exam: Exam: Gen: NAD, conversant      CV: No palpitations or chest pain or SOB. VS: Breathing at a normal rate. Weight appears obese. Not febrile. Eyes: Conjunctivae clear without exudates or hemorrhage  Neuro: Detailed Neurologic Exam  Speech:    Speech is normal; fluent and spontaneous with normal comprehension.  Cognition:    The patient is oriented to  person, place, and time;     recent and remote memory intact;     language fluent;     normal attention, concentration, fund of knowledge Cranial Nerves:    The pupils are equal, round, and reactive to light. Visual fields are full Extraocular movements are intact.  The face is symmetric with normal sensation. The palate elevates in the midline. Hearing intact. Voice is normal. Shoulder shrug is normal. The tongue has normal motion without fasciculations.   Coordination: normal  Gait:    No abnormalities noted or reported  Motor Observation:   no involuntary movements noted. Tone:    Appears normal  Posture:    Posture is normal. normal erect    Strength:    Strength is  anti-gravity and symmetric in the upper and lower limbs.      Sensation: intact to LT, no reports of numbness or tingling or paresthesias          Assessment/Plan:  Patient with chronic daily headaches, optic nerve head edema and migraines. MRI brain/CTA/CTV head unremarkable(MRI partially empty sella. ), opening pressure 26, ONH 1+ papilledema stable but has worsening headaches(vision and ophthalmology exam stable) , intolerance to Topiramate  IR changed to ER. She had a hysterectomy. She has migraines but these are not migraines, Headache are not migrainous she has brain freeze headaches, constant, pressure and worsens with laughing or coughing, bending over.  05/20/2023: doing great on Topiramate  300mg  a day for IDIOPATHIC INTRACRANIAL HYPERTENSION and migraines and treating sleep apnea with dental device. She states her eye exam was improved (requested notes [facility]). Continue this treatment.  Prior information/work up results:   -Opening pressure was 26 consistent with IDIOPATHIC INTRACRANIAL HYPERTENSION.  - Had sleep evaluation mild OSA getting an oral appliance - She is on Trokendi  300mg  continue edema stable, may need to change to or start adding acetazolamide. Would add on acetazolamide  and decrease Topiramate  ER she is having side effects - repeat eye exam in August 2014 stable, next appointment December we will see her after that ([facility]: reviewed 07/27/2022: OCT RNFL(91/97) improved since 04/2022(96/103) grade 1 edema OU improving, feeling better, only one headache during the storm and eclipse, all is going well. She took fioricet and it helped. Doing well on Topiramate  ER. ), 04/2023: December 31, 2022 optic nerves stable in appearance on exam and testing, grade 1 edema OU, symptoms of IIH worse, headaches/pressure when bending over,recommend increase topiramate  to 300 mg daily(completed 2 days after this exam) -Weight loss is critical, discussed weight loss and risk of permanent vision loss, seeing healthy weight and wellness - CTV for venous thrombosis  No evidence of intracranial venous thrombosis,  No convincing dural venous sinus stenosis. -  At this time we will order LP as she has had great response in the past and see what upcoming ophthalmology appointment shows - Possibly having side effects to high dose topiramate  fatigue especially. Lp brought relief for a year and can check pressure as well and remove enough fluid to normalize closing pressure. If that does not work we can add or change to acetazolamide.  - hasn't taken a fioricet in 6 months, we can give her 14 tabs she doesn't use often at all   [facility] notes: reviewed 07/27/2022: IMPROVED grade 1 papilledema. OCT RNFL(91/97) improved since 04/2022(96/103) grade 1 edema OU improving, feeling better, only one headache during the storm and eclipse, all is going well. She took fioricet and it helped. Doing well on Topiramate  ER. )  04/2023: December 31, 2022 optic nerves stable in appearance on exam and testing, grade 1 edema OU, symptoms of IIH worse, headaches/pressure when bending over,recommend increase topiramate  to 300 mg daily(completed 2 days after this exam)  I reviewed notes from [facility] from  January 12, 2022, at that time improved, [place] OD 2020 and OS 2020 at that time she had just started Topamax  and her headaches were worse OCT RNFL and exam showed worsening papilledema OS greater than OD, and she had not had her lumbar puncture yet.  I called [facility] office and received more recent notes from January 26, 2022.,  At that time distance was still 2020 OD and OS corrected, headaches are improved she does get some brain freeze  headaches when it is cold, on exam pupils are equally round and reactive to light, there was no APD, extraocular movements were full, normal adnexa, anterior chamber was deep and quiet, posterior examination showed no hemorrhages no holes or tears, she still had grade 1 nerve swelling OD and grade 1 nerve swelling OS, no ptosis was noted on this exam and no anisocoria was noted stated that pupils were equally round and reactive to light so I am less inclined to keep Horner syndrome in the differential.  We will continue her medication as is, they will recheck dilated exam in 3 to 4 months with fundi photos, at that time if no improvement will need to check topiramate  levels, ensure compliance, and possibly discuss either increase in topiramate  or changing management.  04/2023: December 31, 2022 optic nerves stable in appearance on exam and testing, grade 1 edema OU, symptoms of IIH worse, headaches/pressure when bending over,recommend increase topiramate  to 300 mg daily(completed 2 days after this exam)  Meds ordered this encounter  Medications   butalbital -acetaminophen -caffeine  (FIORICET) 50-325-40 MG tablet    Sig: Take 1 tablet by mouth every 6 (six) hours as needed for headache.    Dispense:  10 tablet    Refill:  3   topiramate  (TOPAMAX ) 100 MG tablet    Sig: Take 3 tablets (300 mg total) by mouth at bedtime.    Dispense:  270 tablet    Refill:  4     For any acute change especially worsening headache or vision loss call 911 and proceed to ED  To prevent or  relieve headaches, try the following: Cool Compress. Lie down and place a cool compress on your head.  Avoid headache triggers. If certain foods or odors seem to have triggered your migraines in the past, avoid them. A headache diary might help you identify triggers.  Include physical activity in your daily routine. Try a daily walk or other moderate aerobic exercise.  Manage stress. Find healthy ways to cope with the stressors, such as delegating tasks on your to-do list.  Practice relaxation techniques. Try deep breathing, yoga, massage and visualization.  Eat regularly. Eating regularly scheduled meals and maintaining a healthy diet might help prevent headaches. Also, drink plenty of fluids.  Follow a regular sleep schedule. Sleep deprivation might contribute to headaches Consider biofeedback. With this mind-body technique, you learn to control certain bodily functions -- such as muscle tension, heart rate and blood pressure -- to prevent headaches or reduce headache pain.    Proceed to emergency room if you experience new or worsening symptoms or symptoms do not resolve, if you have new neurologic symptoms or if headache is severe, or for any concerning symptom.   Provided education and documentation from American headache Society toolbox including articles on: pseudotumoer cerebri(IIH), chronic migraine medication overuse headache, chronic migraines, prevention of migraines and other headaches, behavioral and other nonpharmacologic treatments for headache.  Meds ordered this encounter  Medications   butalbital -acetaminophen -caffeine  (FIORICET) 50-325-40 MG tablet    Sig: Take 1 tablet by mouth every 6 (six) hours as needed for headache.    Dispense:  10 tablet    Refill:  3   topiramate  (TOPAMAX ) 100 MG tablet    Sig: Take 3 tablets (300 mg total) by mouth at bedtime.    Dispense:  270 tablet    Refill:  4     Cc: Pallone, Amanda W, MD,  Pallone, Alan ORN, MD  Amber Epp,  MD  I spent  over 30 minutes of face-to-face and non-face-to-face time with patient on the  1. IIH (idiopathic intracranial hypertension)   2. Migraine without aura and without status migrainosus, not intractable   3. OSA (obstructive sleep apnea)     diagnosis.  This included previsit chart review, lab review, study review, order entry, electronic health record documentation, patient education on the different diagnostic and therapeutic options, counseling and coordination of care, risks and benefits of management, compliance, or risk factor reduction   [facility] [address] [place], [place] [address]  Phone [phone] Fax [phone]  I spent 30 minutes of face-to-face and non-face-to-face time with patient on the  1. IIH (idiopathic intracranial hypertension)   2. Migraine without aura and without status migrainosus, not intractable   3. OSA (obstructive sleep apnea)      diagnosis.  This included previsit chart review, lab review, study review, order entry, electronic health record documentation, patient education on the different diagnostic and therapeutic options, counseling and coordination of care, risks and benefits of management, compliance, or risk factor reduction

## 2023-05-24 ENCOUNTER — Telehealth: Payer: Self-pay | Admitting: Neurology

## 2023-05-24 ENCOUNTER — Telehealth (INDEPENDENT_AMBULATORY_CARE_PROVIDER_SITE_OTHER): Payer: 59 | Admitting: Neurology

## 2023-05-24 DIAGNOSIS — G932 Benign intracranial hypertension: Secondary | ICD-10-CM | POA: Diagnosis not present

## 2023-05-24 DIAGNOSIS — G4733 Obstructive sleep apnea (adult) (pediatric): Secondary | ICD-10-CM

## 2023-05-24 DIAGNOSIS — G43009 Migraine without aura, not intractable, without status migrainosus: Secondary | ICD-10-CM

## 2023-05-24 NOTE — Telephone Encounter (Signed)
 Please schedule patient for 6 month follow up please can be video or in office thanks dr Lucia Gaskins

## 2023-05-24 NOTE — Patient Instructions (Addendum)
 Look into zepbound  : recently approved for sleep apnes and obesity  Tirzepatide  Injection (Weight Management) What is this medication? TIRZEPATIDE  (tir ZEP a tide) promotes weight loss. It may also be used to maintain weight loss.  It works by decreasing appetite. Changes to diet and exercise are often combined with this medication. This medicine may be used for other purposes; ask your health care provider or pharmacist if you have questions. COMMON BRAND NAME(S): Zepbound  What should I tell my care team before I take this medication? They need to know if you have any of these conditions: Eye disease caused by diabetes Gallbladder disease History of depression Pancreatic disease Kidney disease Stomach or intestine problems, such as problems digesting food Suicidal thoughts, plans, or attempt by you or a family member Personal or family history of MEN 2, a condition that causes endocrine gland tumors Personal or family history of thyroid  cancer An unusual or allergic reaction to tirzepatide , other medications, foods, dyes, or preservatives Pregnant or trying to get pregnant Breastfeeding How should I use this medication? This medication is injected under the skin. You will be taught how to prepare and give it. Take it as directed on the prescription label. Keep taking it unless your care team tells you to stop. It is important that you put your used needles and syringes in a special sharps container. Do not put them in a trash can. If you do not have a sharps container, call your pharmacist or care team to get one. A special MedGuide will be given to you by the pharmacist with each prescription and refill. Be sure to read this information carefully each time. This medication comes with INSTRUCTIONS FOR USE. Ask your pharmacist for directions on how to use this medication. Read the information carefully. Talk to your pharmacist or care team if you have questions. Talk to your care team about  the use of this medication in children. Special care may be needed. Overdosage: If you think you have taken too much of this medicine contact a poison control center or emergency room at once. NOTE: This medicine is only for you. Do not share this medicine with others. What if I miss a dose? If you miss a dose, take it as soon as you can unless it is more than 4 days (96 hours) late. If it is more than 4 days late, skip the missed dose. Take the next dose at the normal time. Do not take 2 doses within 3 days (72 hours) of each other. What may interact with this medication? Certain medications for diabetes, such as insulin , glyburide, glipizide This medication may affect how other medications work. Talk with your care team about all of the medications you take. They may suggest changes to your treatment plan to lower the risk of side effects and to make sure your medications work as intended. This list may not describe all possible interactions. Give your health care provider a list of all the medicines, herbs, non-prescription drugs, or dietary supplements you use. Also tell them if you smoke, drink alcohol, or use illegal drugs. Some items may interact with your medicine. What should I watch for while using this medication? Visit your care team for regular checks on your progress. It may be some time before you see the benefit from this medication. Check with your care team if you have severe diarrhea, nausea, and vomiting, or if you sweat a lot. The loss of too much body fluid may make it dangerous for  you to take this medication. Tell your care team if you are taking medications to treat diabetes, such as insulin  or sulfonylureas. This may increase your risk of low blood sugar. Know the symptoms of low blood sugar and how to treat it. Talk to your care team about your risk of cancer. You may be more at risk for certain types of cancer if you take this medication. Estrogen and progestin hormones may  not work as well while you are taking this medication. If you take these as pills by mouth, your care team may recommend another type of contraception for 4 weeks after you start this medication and for 4 weeks after each dose increase. Talk to your care team about contraceptive options. They can help you find the option that works for you. What side effects may I notice from receiving this medication? Side effects that you should report to your care team as soon as possible: Allergic reactions or angioedema--skin rash, itching or hives, swelling of the face, eyes, lips, tongue, arms, or legs, trouble swallowing or breathing Bowel blockage--stomach cramping, unable to have a bowel movement or pass gas, loss of appetite, vomiting Change in vision Dehydration--increased thirst, dry mouth, feeling faint or lightheaded, headache, dark yellow or brown urine Gallbladder problems--severe stomach pain, nausea, vomiting, fever Kidney injury--decrease in the amount of urine, swelling of the ankles, hands, or feet Pancreatitis--severe stomach pain that spreads to your back or gets worse after eating or when touched, fever, nausea, vomiting Thoughts of suicide or self-harm, worsening mood, feelings of depression Thyroid  cancer--new mass or lump in the neck, pain or trouble swallowing, trouble breathing, hoarseness Side effects that usually do not require medical attention (report these to your care team if they continue or are bothersome): Diarrhea Loss of appetite Nausea Upset stomach This list may not describe all possible side effects. Call your doctor for medical advice about side effects. You may report side effects to FDA at 1-800-FDA-1088. Where should I keep my medication? Keep out of the reach of children and pets. Store in a refrigerator or at room temperature up to 30 degrees C (86 degrees F). Keep it in the original container. Protect from light. Refrigeration (preferred): Store in the  refrigerator. Do not freeze. Get rid of any unused medication after the expiration date. Room temperature: This medication may be stored at room temperature for up to 21 days. If it is stored at room temperature, get rid of any unused medication after 21 days or after it expires, whichever is first. To get rid of medications that are no longer needed or have expired: Take the medication to a medication take-back program. Check with your pharmacy or law enforcement to find a location. If you cannot return the medication, ask your pharmacist or care team how to get rid of this medication safely. NOTE: This sheet is a summary. It may not cover all possible information. If you have questions about this medicine, talk to your doctor, pharmacist, or health care provider.  2024 Elsevier/Gold Standard (2022-05-29 00:00:00)

## 2023-05-27 ENCOUNTER — Encounter: Payer: Self-pay | Admitting: Neurology

## 2023-05-27 ENCOUNTER — Ambulatory Visit: Payer: Managed Care, Other (non HMO) | Admitting: Neurology

## 2023-05-27 MED ORDER — TOPIRAMATE 100 MG PO TABS: 300.0000 mg | ORAL_TABLET | Freq: Every day | ORAL | 4 refills | Status: AC

## 2023-05-27 MED ORDER — BUTALBITAL-APAP-CAFFEINE 50-325-40 MG PO TABS: 1.0000 | ORAL_TABLET | Freq: Four times a day (QID) | ORAL | 3 refills | Status: AC | PRN

## 2023-05-27 NOTE — Telephone Encounter (Signed)
 LVM and sent mychart msg asking pt to call back to schedule f/u with Dr. Lucia Gaskins

## 2023-05-28 ENCOUNTER — Ambulatory Visit: Payer: Managed Care, Other (non HMO) | Admitting: Neurology

## 2023-06-03 ENCOUNTER — Ambulatory Visit: Payer: 59 | Admitting: Family Medicine

## 2023-06-03 ENCOUNTER — Encounter: Payer: Self-pay | Admitting: Family Medicine

## 2023-06-03 VITALS — BP 129/83 | HR 77 | Temp 97.6°F | Ht 66.0 in | Wt 235.0 lb

## 2023-06-03 DIAGNOSIS — E66812 Obesity, class 2: Secondary | ICD-10-CM

## 2023-06-03 DIAGNOSIS — E559 Vitamin D deficiency, unspecified: Secondary | ICD-10-CM | POA: Diagnosis not present

## 2023-06-03 DIAGNOSIS — G4733 Obstructive sleep apnea (adult) (pediatric): Secondary | ICD-10-CM | POA: Diagnosis not present

## 2023-06-03 DIAGNOSIS — G932 Benign intracranial hypertension: Secondary | ICD-10-CM

## 2023-06-03 DIAGNOSIS — Z6837 Body mass index (BMI) 37.0-37.9, adult: Secondary | ICD-10-CM

## 2023-06-03 MED ORDER — ZEPBOUND 2.5 MG/0.5ML ~~LOC~~ SOAJ: 2.5000 mg | SUBCUTANEOUS | 0 refills | Status: DC

## 2023-06-03 NOTE — Assessment & Plan Note (Signed)
Last vitamin D Lab Results  Component Value Date   VD25OH 52.1 02/19/2023   She has done well on vitamin D 50,000 IU once weekly.  Her vitamin D level had improved by October and she is due for repeat level next visit

## 2023-06-03 NOTE — Assessment & Plan Note (Signed)
Managed by Dr. Lucia Gaskins on topiramate 300 mg at bedtime with a reduction of headache frequency Her IIH has affected her ability to do vigorous exercise which has in turn affected her weight loss over the past 16 months of medically supervised weight management She has declined the option for weight loss surgery She is down a total of 6.7% total body weight in the past 16 months  Continue current plan of care per neurology.  Continue active plan for weight reduction Begin gentle home exercises such as yoga and short walks for exercise

## 2023-06-03 NOTE — Progress Notes (Signed)
Office: [phone]  /  Fax: [phone]  WEIGHT SUMMARY AND BIOMETRICS  Starting Date: 12/27/21  Starting Weight: 252lb   Weight Lost Since Last Visit: 0lb   Vitals Temp: 97.6 F (36.4 C) BP: 129/83 Pulse Rate: 77 SpO2: 100 %   Body Composition  Body Fat %: 47.4 % Fat Mass (lbs): 111.8 lbs Muscle Mass (lbs): 117.8 lbs Total Body Water (lbs): 86.6 lbs Visceral Fat Rating : 13     HPI  Chief Complaint: OBESITY  Amber Cobb is here to discuss her progress with her obesity treatment plan. She is on the the Category 2 Plan with the intermitting fasting and states she is following her eating plan approximately 75 % of the time. She states she is exercising 0 minutes 0 times per week.  Interval History:  Since last office visit she is up 2 lb She was sick after Christmas  She has been needing Benadryl to help with sleep at night She has craved more starchy foods and sweets She has been somewhat active with no formal exercise  She has a net weight loss of 17 pounds in the past 16 months of medically supervised weight management This is a 6.7% total body weight loss She has not shown interest in weight loss surgery She has previously failed to see improvements using Lomaira  Pharmacotherapy: metformin XR 500 mg once daily + Wellbutrin SR  150 mg bid  PHYSICAL EXAM:  Blood pressure 129/83, pulse 77, temperature 97.6 F (36.4 C), height 5\' 6"  (1.676 m), weight 235 lb (106.6 kg), last menstrual period 06/15/2019, SpO2 100%. Body mass index is 37.93 kg/m.  General: She is overweight, cooperative, alert, well developed, and in no acute distress. PSYCH: Has normal mood, affect and thought process.   Lungs: Normal breathing effort, no conversational dyspnea. She is here with her husband today  ASSESSMENT AND PLAN  TREATMENT PLAN FOR OBESITY:  Recommended Dietary Goals  [name] is currently in the action stage of change. As such, her goal is to continue weight  management plan. She has agreed to the Category 2 Plan.  Behavioral Intervention  We discussed the following Behavioral Modification Strategies today: increasing lean protein intake to established goals, increasing fiber rich foods, increasing water intake , work on meal planning and preparation, keeping healthy foods at home, identifying sources and decreasing liquid calories, practice mindfulness eating and understand the difference between hunger signals and cravings, work on managing stress, creating time for self-care and relaxation, avoiding temptations and identifying enticing environmental cues, planning for success, and continue to work on maintaining a reduced calorie state, getting the recommended amount of protein, incorporating whole foods, making healthy choices, staying well hydrated and practicing mindfulness when eating..  Additional resources provided today: NA  Recommended Physical Activity Goals  [name] has been advised to work up to 150 minutes of moderate intensity aerobic activity a week and strengthening exercises 2-3 times per week for cardiovascular health, weight loss maintenance and preservation of muscle mass.   She has agreed to Think about enjoyable ways to increase daily physical activity and overcoming barriers to exercise and Increase physical activity in their day and reduce sedentary time (increase NEAT).  Pharmacotherapy changes for the treatment of obesity: Begin Zepbound 2.5 mg once weekly injection for OSA indication It is imperative for patient to achieve a healthier weight given her history of IIH, OSA, dyslipidemia and insulin resistance She is not at risk for pregnancy status post hysterectomy Patient denies a personal or family history  of pancreatitis, medullary thyroid carcinoma or multiple endocrine neoplasia type II. Recommend reviewing pen training video online. Reviewed [email or web address] for further information including pen training  video  ASSOCIATED CONDITIONS ADDRESSED TODAY  OSA (obstructive sleep apnea) Assessment & Plan: From 01/12/22 sleep study with Dr Lucia Gaskins:  RECOMMENDATIONS:  Given this patient's history of Intra Cranial -Hypertension with  high CSF opening pressure, the treatment of even mild sleep apnea  is important. CPAP would be an appropriate approach. It is  possible that the AHI here underestimates the severity of apnea  as no REM sleep was captured and the patient did only sleep in  supine for less than 20% of the total sleep time.   Patient is using an oral device for OSA and is actively working on weight reduction With Zepbound indication for OSA treatment, will begin Zepbound 2.5 mg once weekly injection  Orders: -     Zepbound; Inject 2.5 mg into the skin once a week.  Dispense: 2 mL; Refill: 0  Class 2 severe obesity due to excess calories with serious comorbidity and body mass index (BMI) of 37.0 to 37.9 in adult (HCC) -     Zepbound; Inject 2.5 mg into the skin once a week.  Dispense: 2 mL; Refill: 0  Vitamin D deficiency Assessment & Plan: Last vitamin D Lab Results  Component Value Date   VD25OH 52.1 02/19/2023   She has done well on vitamin D 50,000 IU once weekly.  Her vitamin D level had improved by October and she is due for repeat level next visit   IIH (idiopathic intracranial hypertension) Assessment & Plan: Managed by Dr. Lucia Gaskins on topiramate 300 mg at bedtime with a reduction of headache frequency Her IIH has affected her ability to do vigorous exercise which has in turn affected her weight loss over the past 16 months of medically supervised weight management She has declined the option for weight loss surgery She is down a total of 6.7% total body weight in the past 16 months  Continue current plan of care per neurology.  Continue active plan for weight reduction Begin gentle home exercises such as yoga and short walks for exercise       She was informed of the  importance of frequent follow up visits to maximize her success with intensive lifestyle modifications for her multiple health conditions.   ATTESTASTION STATEMENTS:  Reviewed by clinician on day of visit: allergies, medications, problem list, medical history, surgical history, family history, social history, and previous encounter notes pertinent to obesity diagnosis.   I have personally spent 30 minutes total time today in preparation, patient care, nutritional counseling and documentation for this visit, including the following: review of clinical lab tests; review of medical tests/procedures/services.      Glennis Brink, DO DABFM, DABOM [facility] [address] [place], [place] [address] [phone]

## 2023-06-03 NOTE — Assessment & Plan Note (Signed)
From 01/12/22 sleep study with Dr Lucia Gaskins:  RECOMMENDATIONS:  Given this patient's history of Intra Cranial -Hypertension with  high CSF opening pressure, the treatment of even mild sleep apnea  is important. CPAP would be an appropriate approach. It is  possible that the AHI here underestimates the severity of apnea  as no REM sleep was captured and the patient did only sleep in  supine for less than 20% of the total sleep time.   Patient is using an oral device for OSA and is actively working on weight reduction With Zepbound indication for OSA treatment, will begin Zepbound 2.5 mg once weekly injection

## 2023-06-06 ENCOUNTER — Telehealth (INDEPENDENT_AMBULATORY_CARE_PROVIDER_SITE_OTHER): Payer: Self-pay | Admitting: *Deleted

## 2023-06-06 NOTE — Telephone Encounter (Signed)
Prior authorization done via cover my meds for patients Zepbound. Waiting on determination.   

## 2023-06-10 NOTE — Telephone Encounter (Signed)
Prior authorization denied for patients Zepbound. Per her insurance it is not a covered Drug.  Patient notified.

## 2023-06-11 ENCOUNTER — Telehealth: Payer: Self-pay | Admitting: Family Medicine

## 2023-06-11 NOTE — Telephone Encounter (Signed)
She does not have a cardiovascular diagnosis.  Is [employer] covered for obesity management?

## 2023-06-11 NOTE — Telephone Encounter (Signed)
Delice Bison nurse from [employer] was calling because insurance wont cover Zepbound plain exclusion. Insurance will pay for [employer].  These are the criteria:  Pt is over there age of 52 and state the DOB. Pt plan to do XYZ while using this medication fore Cardiovascular risk reduction and weigh loss. Need to know pt HT, WT and BMI. This drug is not solo for weight loss but for Cardiovascular rick reduction.

## 2023-06-11 NOTE — Telephone Encounter (Signed)
Zepbound has an indication for moderate to severe Osa.  This was her indication, not cardiovascular dz.

## 2023-06-11 NOTE — Telephone Encounter (Signed)
Ok, just let pt know.  Thank you!

## 2023-07-04 ENCOUNTER — Ambulatory Visit: Payer: 59 | Admitting: Family Medicine

## 2023-07-18 ENCOUNTER — Ambulatory Visit: Payer: 59 | Admitting: Family Medicine

## 2023-07-18 ENCOUNTER — Other Ambulatory Visit: Payer: Self-pay | Admitting: Family Medicine

## 2023-07-18 ENCOUNTER — Encounter: Payer: Self-pay | Admitting: Family Medicine

## 2023-07-18 VITALS — BP 127/82 | HR 63 | Temp 98.0°F | Ht 66.0 in | Wt 233.0 lb

## 2023-07-18 DIAGNOSIS — E88819 Insulin resistance, unspecified: Secondary | ICD-10-CM | POA: Diagnosis not present

## 2023-07-18 DIAGNOSIS — I10 Essential (primary) hypertension: Secondary | ICD-10-CM

## 2023-07-18 DIAGNOSIS — E559 Vitamin D deficiency, unspecified: Secondary | ICD-10-CM

## 2023-07-18 DIAGNOSIS — Z6837 Body mass index (BMI) 37.0-37.9, adult: Secondary | ICD-10-CM

## 2023-07-18 DIAGNOSIS — F3289 Other specified depressive episodes: Secondary | ICD-10-CM

## 2023-07-18 DIAGNOSIS — G932 Benign intracranial hypertension: Secondary | ICD-10-CM | POA: Diagnosis not present

## 2023-07-18 DIAGNOSIS — F5089 Other specified eating disorder: Secondary | ICD-10-CM

## 2023-07-18 DIAGNOSIS — G4733 Obstructive sleep apnea (adult) (pediatric): Secondary | ICD-10-CM

## 2023-07-18 DIAGNOSIS — E66812 Obesity, class 2: Secondary | ICD-10-CM

## 2023-07-18 MED ORDER — LOSARTAN POTASSIUM 50 MG PO TABS
50.0000 mg | ORAL_TABLET | Freq: Every day | ORAL | 0 refills | Status: DC
Start: 2023-07-18 — End: 2023-11-06

## 2023-07-18 MED ORDER — METFORMIN HCL ER 500 MG PO TB24: 500.0000 mg | ORAL_TABLET | Freq: Every day | ORAL | 0 refills | Status: DC

## 2023-07-18 MED ORDER — VITAMIN D (ERGOCALCIFEROL) 1.25 MG (50000 UNIT) PO CAPS: 50000.0000 [IU] | ORAL_CAPSULE | ORAL | 0 refills | Status: DC

## 2023-07-18 NOTE — Progress Notes (Signed)
 Office: [phone]  /  Fax: [phone]  WEIGHT SUMMARY AND BIOMETRICS  Starting Date: 12/27/21  Starting Weight: 252lb   Weight Lost Since Last Visit: 2lb   Vitals Temp: 98 F (36.7 C) BP: 127/82 Pulse Rate: 63 SpO2: 98 %   Body Composition  Body Fat %: 47.5 % Fat Mass (lbs): 110.6 lbs Muscle Mass (lbs): 116.2 lbs Total Body Water (lbs): 88.2 lbs Visceral Fat Rating : 13   HPI  Chief Complaint: OBESITY  Amber Cobb is here to discuss her progress with her obesity treatment plan. She is on the the Category 2 Plan and states she is following her eating plan approximately 75 % of the time. She states she is exercising 30 minutes 2 times per week.  Interval History:  Since last office visit she is down 2 lb This gives her a net weight loss of 19 lb in the past 18 mos of medically supervised weight management This is a 7.5% total body weight loss She lacks insurance coverage for antiobesity medications She has previously failed to see much improvement on Lomaira which has been discontinued She has had stressors at home  She didn't have power at home the past week and had to eat fast food She has been without her car She also has been sick with Norovirus since last visit  She has not seen much difference in appetite, food cravings or emotional eating on Wellbutrin SR 150 mg twice daily and metformin XR 500 mg once daily  Pharmacotherapy: metformin XR 500 mg daily + Wellbutrin SR 150 mg bid  PHYSICAL EXAM:  Blood pressure 127/82, pulse 63, temperature 98 F (36.7 C), height 5\' 6"  (1.676 m), weight 233 lb (105.7 kg), last menstrual period 06/15/2019, SpO2 98%. Body mass index is 37.61 kg/m.  General: She is overweight, cooperative, alert, well developed, and in no acute distress. PSYCH: Has normal mood, affect and thought process.   Lungs: Normal breathing effort, no conversational dyspnea.   ASSESSMENT AND PLAN  TREATMENT PLAN FOR OBESITY:  Recommended  Dietary Goals  [name] is currently in the action stage of change. As such, her goal is to continue weight management plan. She has agreed to keeping a food journal and adhering to recommended goals of 1400 calories and 90 to 120 g of protein and practicing portion control and making smarter food choices, such as increasing vegetables and decreasing simple carbohydrates. Recommend logging daily caloric intake using the my fitness pal app to ensure she is hitting her target for calories and protein daily  Behavioral Intervention  We discussed the following Behavioral Modification Strategies today: increasing lean protein intake to established goals, increasing fiber rich foods, increasing water intake , work on meal planning and preparation, work on tracking and journaling calories using tracking application, keeping healthy foods at home, work on managing stress, creating time for self-care and relaxation, avoiding temptations and identifying enticing environmental cues, and continue to work on maintaining a reduced calorie state, getting the recommended amount of protein, incorporating whole foods, making healthy choices, staying well hydrated and practicing mindfulness when eating..  Additional resources provided today: NA  Recommended Physical Activity Goals  [name] has been advised to work up to 150 minutes of moderate intensity aerobic activity a week and strengthening exercises 2-3 times per week for cardiovascular health, weight loss maintenance and preservation of muscle mass.   She has agreed to Think about enjoyable ways to increase daily physical activity and overcoming barriers to exercise and Increase physical activity  in their day and reduce sedentary time (increase NEAT).  Pharmacotherapy changes for the treatment of obesity: None  ASSOCIATED CONDITIONS ADDRESSED TODAY  Essential hypertension Stable, at goal -     Losartan Potassium; Take 1 tablet (50 mg total) by mouth  daily.  Dispense: 90 tablet; Refill: 0 -     Comprehensive metabolic panel  Insulin resistance Doing well on metformin XR 500 mg once daily with food without adverse side effect Struggling with compliance with dietary change and regular exercise Has made a conscious effort to reduce intake of sugar Barriers to further exercise include IIH -     metFORMIN HCl ER; Take 1 tablet (500 mg total) by mouth daily with breakfast.  Dispense: 90 tablet; Refill: 0 -     Insulin, random  Vitamin D deficiency Last vitamin D Lab Results  Component Value Date   VD25OH 52.1 02/19/2023   Doing well on vitamin D 50,000 IU once weekly.  Due for labs today -     VITAMIN D 25 Hydroxy (Vit-D Deficiency, Fractures) -     Vitamin D (Ergocalciferol); Take 1 capsule (50,000 Units total) by mouth every 7 (seven) days.  Dispense: 12 capsule; Refill: 0  Class 2 obesity due to excess calories with body mass index (BMI) of 37.0 to 37.9 in adult, unspecified whether serious comorbidity present  IIH (idiopathic intracranial hypertension) Stable per Dr. Daisy Blossom, on topiramate Reduced headache frequency Denies daily nausea  OSA (obstructive sleep apnea) History of mild OSA Insurance did not cover Zepbound for her sleep apnea diagnosis  Other depression with emotional eating Slight improvements in emotional eating on Wellbutrin SR 150 mg twice daily Has a good support system at home Keeping junk food triggers out of the house Stress levels have been high since last visit  Continue to work on healthy behavior changes, stress reduction and will continue Wellbutrin SR 150 mg twice a day     She was informed of the importance of frequent follow up visits to maximize her success with intensive lifestyle modifications for her multiple health conditions.   ATTESTASTION STATEMENTS:  Reviewed by clinician on day of visit: allergies, medications, problem list, medical history, surgical history, family history, social  history, and previous encounter notes pertinent to obesity diagnosis.   I have personally spent 30 minutes total time today in preparation, patient care, nutritional counseling and documentation for this visit, including the following: review of clinical lab tests; review of medical tests/procedures/services.      Glennis Brink, DO DABFM, DABOM [facility] [address] [place], [place] [address] [phone]

## 2023-07-19 LAB — COMPREHENSIVE METABOLIC PANEL
ALT: 24 IU/L (ref 0–32)
AST: 18 IU/L (ref 0–40)
Albumin: 3.9 g/dL (ref 3.9–4.9)
Alkaline Phosphatase: 70 IU/L (ref 44–121)
BUN/Creatinine Ratio: 21 (ref 9–23)
BUN: 17 mg/dL (ref 6–24)
Bilirubin Total: 0.3 mg/dL (ref 0.0–1.2)
CO2: 21 mmol/L (ref 20–29)
Calcium: 8.3 mg/dL — ABNORMAL LOW (ref 8.7–10.2)
Chloride: 108 mmol/L — ABNORMAL HIGH (ref 96–106)
Creatinine, Ser: 0.82 mg/dL (ref 0.57–1.00)
Globulin, Total: 2.7 g/dL (ref 1.5–4.5)
Glucose: 93 mg/dL (ref 70–99)
Potassium: 3.9 mmol/L (ref 3.5–5.2)
Sodium: 140 mmol/L (ref 134–144)
Total Protein: 6.6 g/dL (ref 6.0–8.5)
eGFR: 89 mL/min/{1.73_m2} (ref >59)

## 2023-07-19 LAB — VITAMIN D 25 HYDROXY (VIT D DEFICIENCY, FRACTURES): Vit D, 25-Hydroxy: 54.6 ng/mL (ref 30.0–100.0)

## 2023-07-19 LAB — INSULIN, RANDOM: INSULIN: 14.4 u[IU]/mL (ref 2.6–24.9)

## 2023-09-02 ENCOUNTER — Ambulatory Visit: Admitting: Family Medicine

## 2023-09-02 VITALS — BP 112/73 | HR 68 | Temp 98.4°F | Ht 66.0 in | Wt 233.0 lb

## 2023-09-02 DIAGNOSIS — G932 Benign intracranial hypertension: Secondary | ICD-10-CM

## 2023-09-02 DIAGNOSIS — F5089 Other specified eating disorder: Secondary | ICD-10-CM

## 2023-09-02 DIAGNOSIS — I10 Essential (primary) hypertension: Secondary | ICD-10-CM | POA: Diagnosis not present

## 2023-09-02 DIAGNOSIS — R7989 Other specified abnormal findings of blood chemistry: Secondary | ICD-10-CM | POA: Diagnosis not present

## 2023-09-02 DIAGNOSIS — F3289 Other specified depressive episodes: Secondary | ICD-10-CM

## 2023-09-02 DIAGNOSIS — Z6837 Body mass index (BMI) 37.0-37.9, adult: Secondary | ICD-10-CM

## 2023-09-02 DIAGNOSIS — E66812 Obesity, class 2: Secondary | ICD-10-CM

## 2023-09-02 NOTE — Patient Instructions (Signed)
 Continue to work on : Meal planning Limiting snacks between meals (fresh fruit, high protein options) Fill up on non starchy veggies Aim for a good 90-120 g of protein daily Hydrate well with water and sugar free electrolytes  Aim for 8,000 + steps 5-6 days/ wk  Work on stress reduction Keep junk food out of the house as much as possible

## 2023-09-02 NOTE — Progress Notes (Signed)
 Office: [phone]  /  Fax: [phone]  WEIGHT SUMMARY AND BIOMETRICS  Starting Date: 12/27/21  Starting Weight: 252 lb   Weight Lost Since Last Visit: 0   Vitals Temp: 98.4 F (36.9 C) BP: 112/73 Pulse Rate: 68 SpO2: 99 %   Body Composition  Body Fat %: 46.9 % Fat Mass (lbs): 109.4 lbs Muscle Mass (lbs): 117.8 lbs Total Body Water (lbs): 90 lbs Visceral Fat Rating : 12   HPI  Chief Complaint: OBESITY  Amber Cobb is here to discuss her progress with her obesity treatment plan. She is on the the Category 2 Plan and states she is following her eating plan approximately 85-90 % of the time. She states she is exercising and walking 30-60 minutes 3-5 times per week.  Interval History: down 0 lb Since last office visit she is up 1.6 lb of muscle and is down 1.2 lb of body fat She has a net weight loss of 19 lb in the past 19 mos of medically supervised weight management She has had less frequent and less severe HAs with IIH She did lose one of her dogs to cancer since last visit Her husband has been more on board with eating healthy and walking She has tried to limit sweets She will be visiting her daughter in [place] in June  Pharmacotherapy: Wellbutrin  SR 150 mg bid  + Metformin  XR 500 mg daily  PHYSICAL EXAM:  Blood pressure 112/73, pulse 68, temperature 98.4 F (36.9 C), height 5\' 6"  (1.676 m), weight 233 lb (105.7 kg), last menstrual period 06/15/2019, SpO2 99%. Body mass index is 37.61 kg/m.  General: She is overweight, cooperative, alert, well developed, and in no acute distress. PSYCH: Has normal mood, affect and thought process.   Lungs: Normal breathing effort, no conversational dyspnea.   ASSESSMENT AND PLAN  TREATMENT PLAN FOR OBESITY:  Recommended Dietary Goals  Amber Cobb is currently in the action stage of change. As such, her goal is to continue weight management plan. She has agreed to keeping a food journal and adhering to recommended  goals of 1400 calories and 100 g of  protein and following a lower carbohydrate, vegetable and lean protein rich diet plan.  Behavioral Intervention  We discussed the following Behavioral Modification Strategies today: increasing lean protein intake to established goals, increasing fiber rich foods, increasing water intake , work on meal planning and preparation, work on Counselling psychologist calories using tracking application, keeping healthy foods at home, practice mindfulness eating and understand the difference between hunger signals and cravings, work on managing stress, creating time for self-care and relaxation, avoiding temptations and identifying enticing environmental cues, continue to practice mindfulness when eating, and continue to work on maintaining a reduced calorie state, getting the recommended amount of protein, incorporating whole foods, making healthy choices, staying well hydrated and practicing mindfulness when eating..  Additional resources provided today: NA  Recommended Physical Activity Goals  Amber Cobb has been advised to work up to 150 minutes of moderate intensity aerobic activity a week and strengthening exercises 2-3 times per week for cardiovascular health, weight loss maintenance and preservation of muscle mass.   She has agreed to Increase the intensity, frequency or duration of aerobic exercises   Reviewed step goal of 8,000 + steps 5-6 days/ wk  Pharmacotherapy changes for the treatment of obesity: none  ASSOCIATED CONDITIONS ADDRESSED TODAY  Low serum calcium Last calcium levels drawn 07/18/2023 at 8.3. She is currently not on a calcium supplement.  Taking multivitamin daily.  Denies use of diuretics. Calcium level with an intact PTH today -     PTH, intact and calcium  Class 2 severe obesity due to excess calories with serious comorbidity and body mass index (BMI) of 37.0 to 37.9 in adult [facility]) Improving body composition with increased walking and  improving choices with better compliance.  She has lost 19 pounds in the past 19 months of medically supervised weight management  She has been off of Lomaira  with inadequate weight reduction  Essential hypertension Blood pressure is at goal.  She is currently on Losartan   50 mg once daily.  Tolerating well Continue active plan for weight reduction.  IIH (idiopathic intracranial hypertension) Improving with a decrease in headache frequency and severity.  She is typically worse in the winter months.  She has been able to walk more.  Encouraged hydrating well with water and sugar-free electrolytes.  Other depression with emotional eating Worsened after the loss of her dog.  Her husband has been supportive.  She does report sweet treats for few days.  She is no longer keeping these foods at home.  She has a working sleeping well, improving nutrition, stress reduction and walking.  Mood has improved on Wellbutrin  SR 150 mg twice daily and Lexapro  10 mg once daily.     She was informed of the importance of frequent follow up visits to maximize her success with intensive lifestyle modifications for her multiple health conditions.   ATTESTASTION STATEMENTS:  Reviewed by clinician on day of visit: allergies, medications, problem list, medical history, surgical history, family history, social history, and previous encounter notes pertinent to obesity diagnosis.   I have personally spent 30 minutes total time today in preparation, patient care, nutritional counseling and education,  and documentation for this visit, including the following: review of most recent clinical lab tests, prescribing medications/ refilling medications, reviewing medical assistant documentation, review and interpretation of bioimpedence results.     Amber Cobb Albee, D.O. DABFM, DABOM [facility] [address] [place], [place] [address] [phone]

## 2023-09-03 LAB — PTH, INTACT AND CALCIUM
Calcium: 9.2 mg/dL (ref 8.7–10.2)
PTH: 23 pg/mL (ref 15–65)

## 2023-10-09 ENCOUNTER — Ambulatory Visit: Admitting: Family Medicine

## 2023-10-11 ENCOUNTER — Other Ambulatory Visit: Payer: Self-pay | Admitting: Family Medicine

## 2023-10-11 DIAGNOSIS — I10 Essential (primary) hypertension: Secondary | ICD-10-CM

## 2023-10-11 DIAGNOSIS — E88819 Insulin resistance, unspecified: Secondary | ICD-10-CM

## 2023-10-31 ENCOUNTER — Encounter (INDEPENDENT_AMBULATORY_CARE_PROVIDER_SITE_OTHER): Admitting: Family Medicine

## 2023-11-04 NOTE — Progress Notes (Signed)
 Pt was late for OV and moved to a WT check visit (no charge) Offered video visit the following week

## 2023-11-06 ENCOUNTER — Telehealth (INDEPENDENT_AMBULATORY_CARE_PROVIDER_SITE_OTHER): Admitting: Family Medicine

## 2023-11-06 DIAGNOSIS — F3289 Other specified depressive episodes: Secondary | ICD-10-CM | POA: Diagnosis not present

## 2023-11-06 DIAGNOSIS — I10 Essential (primary) hypertension: Secondary | ICD-10-CM | POA: Diagnosis not present

## 2023-11-06 DIAGNOSIS — E88819 Insulin resistance, unspecified: Secondary | ICD-10-CM | POA: Diagnosis not present

## 2023-11-06 DIAGNOSIS — Z6837 Body mass index (BMI) 37.0-37.9, adult: Secondary | ICD-10-CM

## 2023-11-06 DIAGNOSIS — E66812 Obesity, class 2: Secondary | ICD-10-CM

## 2023-11-06 DIAGNOSIS — E559 Vitamin D deficiency, unspecified: Secondary | ICD-10-CM

## 2023-11-06 DIAGNOSIS — F5089 Other specified eating disorder: Secondary | ICD-10-CM

## 2023-11-06 MED ORDER — VITAMIN D (ERGOCALCIFEROL) 1.25 MG (50000 UNIT) PO CAPS
50000.0000 [IU] | ORAL_CAPSULE | ORAL | 0 refills | Status: DC
Start: 2023-11-06 — End: 2024-01-06

## 2023-11-06 MED ORDER — BUPROPION HCL ER (SR) 150 MG PO TB12: 150.0000 mg | ORAL_TABLET | Freq: Two times a day (BID) | ORAL | 0 refills | Status: DC

## 2023-11-06 MED ORDER — LOSARTAN POTASSIUM 50 MG PO TABS: 50.0000 mg | ORAL_TABLET | Freq: Every day | ORAL | 0 refills | Status: DC

## 2023-11-06 MED ORDER — METFORMIN HCL ER 500 MG PO TB24
500.0000 mg | ORAL_TABLET | Freq: Every day | ORAL | 0 refills | Status: DC
Start: 2023-11-06 — End: 2024-01-09

## 2023-11-06 NOTE — Progress Notes (Signed)
 Office: [phone]  /  Fax: [phone]  WEIGHT SUMMARY AND BIOMETRICS  No data recorded No data recorded  No data recorded  No data recorded No data recorded I connected with  Delon Hoit on 11/06/23 by a video and audio enabled telemedicine application and verified that I am speaking with the correct person using two identifiers.  Patient Location: Home  Provider Location: Office/Clinic  I discussed the limitations of evaluation and management by telemedicine. The patient expressed understanding and agreed to proceed.   HPI  Chief Complaint: OBESITY  Amber Cobb is here to discuss her progress with her obesity treatment plan. She is on the the Category 2 Plan and states she is following her eating plan approximately 90 % of the time. She states she is exercising 0 minutes 0 times per week.  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 There is no height or weight on file to calculate BMI.  General: She is overweight, cooperative, alert, well developed, and in no acute distress. PSYCH: Has normal mood, affect and thought process.   Lungs: Normal breathing effort, no conversational dyspnea.   ASSESSMENT AND PLAN  TREATMENT PLAN FOR OBESITY:  Recommended Dietary Goals  Amber Cobb is currently in the action stage of change. As such, her goal is to continue weight management  plan. She has agreed to the Category 2 Plan.  Behavioral Intervention  We discussed the following Behavioral Modification Strategies today: increasing lean protein intake to established goals, increasing fiber rich foods, increasing water intake , work on meal planning and preparation, keeping healthy foods at home, work on managing stress, creating time for self-care and relaxation, avoiding temptations and identifying enticing environmental cues, planning for success, and continue to work on maintaining a reduced calorie state, getting the recommended amount of protein, incorporating whole foods, making healthy choices, staying well hydrated and practicing mindfulness when eating..  Additional resources provided today: NA  Recommended Physical Activity Goals  Amber Cobb has been advised to work up to 150 minutes of moderate intensity aerobic activity a week and strengthening exercises 2-3 times per week for cardiovascular health, weight loss maintenance and preservation of muscle mass.   She has agreed to Think about enjoyable ways to increase daily physical activity and overcoming barriers to exercise and Increase physical activity in their day and reduce sedentary time (increase NEAT).  Pharmacotherapy changes for the treatment of obesity: none  ASSOCIATED CONDITIONS ADDRESSED TODAY  Vitamin D  deficiency Last vitamin D  Lab Results  Component Value Date   VD25OH 54.6 07/18/2023  Taking RX vitamin D  weekly Due for level next visit -     Vitamin D  (Ergocalciferol ); Take 1 capsule (50,000 Units total) by mouth every 7 (seven) days.  Dispense: 12 capsule; Refill: 0  Other depression with emotional eating Improving on Wellbutrin  SR 150 mg bid and started counseling  Husband is supportive Keeping junk food out of the house -     buPROPion  HCl ER (SR); Take 1 tablet (150 mg total) by mouth 2 (two) times daily.  Dispense: 180 tablet; Refill: 0  Essential hypertension Doing well on Losartan   50 mg daily without adverse SE Continue active plan for weight loss -     Losartan  Potassium; Take 1 tablet (50 mg total) by mouth daily.  Dispense: 90 tablet; Refill: 0  Insulin  resistance 3/6 fasting insulin  improved at 14.4 from 17.2 on metformin  XR 500 mg with breakfast, tolerating well Working on a reduced kcal diet, weight loss and lowering sugar intake Plan to add in 30 min of walking daily as asthma improves -     metFORMIN  HCl ER; Take 1 tablet (500 mg total) by mouth daily with breakfast.  Dispense: 90 tablet; Refill: 0  Class II obesity with BMI 37 F/u 2 mos + labs     She was informed of the importance of frequent follow up visits to maximize her success with intensive lifestyle modifications for her multiple health conditions.   ATTESTASTION STATEMENTS:  Reviewed by clinician on day of visit: allergies, medications, problem list, medical history, surgical history, family history, social history, and previous encounter notes pertinent to obesity diagnosis.      Darice Haddock, D.O. DABFM, DABOM [facility] [address] [place], [place] [address] [phone]

## 2023-12-18 ENCOUNTER — Other Ambulatory Visit: Payer: Self-pay | Admitting: *Deleted

## 2023-12-18 ENCOUNTER — Other Ambulatory Visit: Payer: Self-pay | Admitting: Neurology

## 2024-01-06 ENCOUNTER — Telehealth: Payer: Self-pay | Admitting: *Deleted

## 2024-01-06 ENCOUNTER — Encounter: Payer: Self-pay | Admitting: Family Medicine

## 2024-01-06 ENCOUNTER — Ambulatory Visit: Admitting: Family Medicine

## 2024-01-06 VITALS — BP 132/85 | HR 71 | Temp 97.8°F | Ht 66.0 in | Wt 236.0 lb

## 2024-01-06 DIAGNOSIS — I10 Essential (primary) hypertension: Secondary | ICD-10-CM

## 2024-01-06 DIAGNOSIS — E88819 Insulin resistance, unspecified: Secondary | ICD-10-CM

## 2024-01-06 DIAGNOSIS — E559 Vitamin D deficiency, unspecified: Secondary | ICD-10-CM

## 2024-01-06 DIAGNOSIS — E785 Hyperlipidemia, unspecified: Secondary | ICD-10-CM | POA: Diagnosis not present

## 2024-01-06 DIAGNOSIS — F5089 Other specified eating disorder: Secondary | ICD-10-CM

## 2024-01-06 DIAGNOSIS — Z6838 Body mass index (BMI) 38.0-38.9, adult: Secondary | ICD-10-CM

## 2024-01-06 DIAGNOSIS — F3289 Other specified depressive episodes: Secondary | ICD-10-CM | POA: Diagnosis not present

## 2024-01-06 DIAGNOSIS — E66812 Obesity, class 2: Secondary | ICD-10-CM

## 2024-01-06 DIAGNOSIS — G932 Benign intracranial hypertension: Secondary | ICD-10-CM

## 2024-01-06 MED ORDER — ESCITALOPRAM OXALATE 10 MG PO TABS: 10.0000 mg | ORAL_TABLET | Freq: Every day | ORAL | 1 refills | Status: DC

## 2024-01-06 MED ORDER — BUPROPION HCL ER (SR) 150 MG PO TB12: 150.0000 mg | ORAL_TABLET | Freq: Two times a day (BID) | ORAL | 0 refills | Status: DC

## 2024-01-06 MED ORDER — OZEMPIC (0.25 OR 0.5 MG/DOSE) 2 MG/3ML ~~LOC~~ SOPN: 0.2500 mg | PEN_INJECTOR | SUBCUTANEOUS | 0 refills | Status: DC

## 2024-01-06 MED ORDER — LOSARTAN POTASSIUM 50 MG PO TABS: 50.0000 mg | ORAL_TABLET | Freq: Every day | ORAL | 0 refills | Status: DC

## 2024-01-06 MED ORDER — VITAMIN D (ERGOCALCIFEROL) 1.25 MG (50000 UNIT) PO CAPS: 50000.0000 [IU] | ORAL_CAPSULE | ORAL | 0 refills | Status: DC

## 2024-01-06 NOTE — Progress Notes (Signed)
 Office: [phone]  /  Fax: [phone]  WEIGHT SUMMARY AND BIOMETRICS  Starting Date: 12/27/21  Starting Weight: 252lb   Weight Lost Since Last Visit: 0lb   Vitals Temp: 97.8 F (36.6 C) BP: 132/85 Pulse Rate: 71 SpO2: 97 %   Body Composition  Body Fat %: 46.6 % Fat Mass (lbs): 110.2 lbs Muscle Mass (lbs): 119.8 lbs Total Body Water (lbs): 88.4 lbs Visceral Fat Rating : 13    HPI  Chief Complaint: OBESITY  Khalani is here to discuss her progress with her obesity treatment plan. She is on the the Category 2 Plan and states she is following her eating plan approximately 30 % of the time. She states she is exercising 0 minutes 0 times per week.  Interval History:  Since last office visit she is up 4 lb She is down 1.8 pounds of muscle mass and up 5.8 pounds of body fat since last visit This gives her a net weight loss of 16 pounds in the past 2 years medically supervised weight management This is a 6.3% total body weight loss She was last seen on video visit She has had more junk food (cereal) in the house for grandkids and this has tempted her She is doing more emotional eating, battling depression - in therapy She has a good system at home She has chronic fatigue, worsened so she is less active  Pharmacotherapy: Metformin  XR 500 mg once daily and Wellbutrin  SR 150 mg twice daily Previously failed to see adequate weight loss on Lomaira  Currently on topiramate  300 mg at bedtime per neurology  PHYSICAL EXAM:  Blood pressure 132/85, pulse 71, temperature 97.8 F (36.6 C), height 5' 6 (1.676 m), weight 236 lb (107 kg), last menstrual period 06/15/2019, SpO2 97%. Body mass index is 38.09 kg/m.  General: She is overweight, cooperative, alert, well developed, and in no acute distress. PSYCH: Has normal mood, affect and thought process.   Lungs: Normal breathing effort, no conversational dyspnea.  ASSESSMENT AND PLAN  TREATMENT PLAN FOR  OBESITY:  Recommended Dietary Goals  Alicen is currently in the action stage of change. As such, her goal is to continue weight management plan. She has agreed to keeping a food journal and adhering to recommended goals of 1400 calories and 100 g of protein and practicing portion control and making smarter food choices, such as increasing vegetables and decreasing simple carbohydrates.  Behavioral Intervention  We discussed the following Behavioral Modification Strategies today: increasing lean protein intake to established goals, increasing fiber rich foods, increasing water intake , work on meal planning and preparation, work on Counselling psychologist calories using tracking application, keeping healthy foods at home, practice mindfulness eating and understand the difference between hunger signals and cravings, work on managing stress, creating time for self-care and relaxation, avoiding temptations and identifying enticing environmental cues, continue to work on maintaining a reduced calorie state, getting the recommended amount of protein, incorporating whole foods, making healthy choices, staying well hydrated and practicing mindfulness when eating., and increase protein intake, fibrous foods (25 grams per day for women, 30 grams for men) and water to improve satiety and decrease hunger signals. .  Additional resources provided today: NA  Recommended Physical Activity Goals  Mikelle has been advised to work up to 150 minutes of moderate intensity aerobic activity a week and strengthening exercises 2-3 times per week for cardiovascular health, weight loss maintenance and preservation of muscle mass.   She has agreed to Think about enjoyable ways  to increase daily physical activity and overcoming barriers to exercise and Increase physical activity in their day and reduce sedentary time (increase NEAT). Reviewed exercise change goals on after visit summary  Pharmacotherapy changes for the  treatment of obesity: Begin Ozempic  0.25 mg once weekly injections for insulin  resistance  ASSOCIATED CONDITIONS ADDRESSED TODAY  Dyslipidemia Lab Results  Component Value Date   CHOL 212 (H) 02/19/2023   HDL 49 02/19/2023   LDLCALC 112 (H) 02/19/2023   TRIG 299 (H) 02/19/2023   CHOLHDL 4.3 02/19/2023    - she is not on any cholesterol-lowering medications.  Last fasting lipid panel was elevated.  Repeat today  Other depression with emotional eating Improving on Wellbutrin  SR 150 mg twice daily.  Denies adverse side effects.  Mood stable on Lexapro  10 mg at bedtime.  She has started counseling.  Stress levels remain high but she has a good support system at home.  Emotional eating has increased.  Recommend keeping junk food triggers out of the house.  Her motivation to make change has been poor. -     buPROPion  HCl ER (SR); Take 1 tablet (150 mg total) by mouth 2 (two) times daily.  Dispense: 180 tablet; Refill: 0 -     Escitalopram  Oxalate; Take 1 tablet (10 mg total) by mouth at bedtime.  Dispense: 30 tablet; Refill: 1  Essential hypertension Blood pressure is well-controlled on losartan  50 mg once daily.  Update chemistry panel today.  This medication may be taken over by her PCP. -     Losartan  Potassium; Take 1 tablet (50 mg total) by mouth daily.  Dispense: 90 tablet; Refill: 0 -     Basic metabolic panel with GFR  Insulin  resistance Her fasting insulin  has been elevated since she started with our program 2 years ago.  She is currently on metformin  XR 500 mg once daily.  Update labs today.  Consider increasing dose if unable to get Ozempic .  We discussed the importance of adding on additional walking time and reducing refined carbohydrates which continue to be part of her daily intake. -     Ozempic  (0.25 or 0.5 MG/DOSE); Inject 0.25 mg into the skin once a week.  Dispense: 3 mL; Refill: 0 -     Insulin , random  Vitamin D  deficiency Last vitamin D  Lab Results  Component Value  Date   VD25OH 54.6 07/18/2023   She has been taking vitamin D  50,000 IU once weekly.  Repeat labs today. -     Vitamin D  (Ergocalciferol ); Take 1 capsule (50,000 Units total) by mouth every 7 (seven) days.  Dispense: 12 capsule; Refill: 0 -     VITAMIN D  25 Hydroxy (Vit-D Deficiency, Fractures)  Class 2 severe obesity due to excess calories with serious comorbidity and body mass index (BMI) of 38.0 to 38.9 in adult [facility]) For overall weight loss in the past 2 years is medically supervised weight management is less than expected.  Obesity-related comorbidities such as IIH and depression have been barriers to her progress.  Insurance has affected options for antiobesity medications.  She does have room for improvement with compliance on diet and exercise.  Consider role of bariatric surgery if needed.  IIH (idiopathic intracranial hypertension) She is managed by Dr. Darleen neurology for IIH.  Chronic headaches and daily fatigue have factored into her weight loss progress.  Physical activity level remains fairly low.  She is currently on Fioricet as needed for headaches and Topamax  300 mg once daily.  She has lost only 6% of her total body weight and 2 years of medically supervised weight management with some noncompliance with both diet and exercise.  Consider the role of bariatric surgery if needed.     She was informed of the importance of frequent follow up visits to maximize her success with intensive lifestyle modifications for her multiple health conditions.   ATTESTASTION STATEMENTS:  Reviewed by clinician on day of visit: allergies, medications, problem list, medical history, surgical history, family history, social history, and previous encounter notes pertinent to obesity diagnosis.   I have personally spent 35 minutes total time today in preparation, patient care, nutritional counseling and education,  and documentation for this visit, including the following: review of most recent  clinical lab tests, prescribing medications/ refilling medications, reviewing medical assistant documentation, review and interpretation of bioimpedence results.     Darice Haddock, D.O. DABFM, DABOM [facility] [address] [place], [place] [address] [phone]

## 2024-01-06 NOTE — Patient Instructions (Addendum)
 Await PA for Ozempic  Once you start on Ozempic  0.25 mg weekly injection, you can STOP metformin   Continue counseling Swap out regular cereal for [employer], [employer], or [employer] Protein cereal You can have this with [employer] milk for more protein  Don't skip meals Remember fruits and veggie Hydrate well water   Add in 20 min of walking daily

## 2024-01-06 NOTE — Telephone Encounter (Signed)
Prior authorization done via cover my meds for patients Ozempic. Waiting on determination.  

## 2024-01-07 LAB — LIPID PANEL
Chol/HDL Ratio: 3.9 ratio (ref 0.0–4.4)
Cholesterol, Total: 184 mg/dL (ref 100–199)
HDL: 47 mg/dL (ref >39)
LDL Chol Calc (NIH): 103 mg/dL — ABNORMAL HIGH (ref 0–99)
Triglycerides: 196 mg/dL — ABNORMAL HIGH (ref 0–149)
VLDL Cholesterol Cal: 34 mg/dL (ref 5–40)

## 2024-01-07 LAB — BASIC METABOLIC PANEL WITH GFR
BUN/Creatinine Ratio: 24 — ABNORMAL HIGH (ref 9–23)
BUN: 24 mg/dL (ref 6–24)
CO2: 18 mmol/L — ABNORMAL LOW (ref 20–29)
Calcium: 9.3 mg/dL (ref 8.7–10.2)
Chloride: 104 mmol/L (ref 96–106)
Creatinine, Ser: 1 mg/dL (ref 0.57–1.00)
Glucose: 93 mg/dL (ref 70–99)
Potassium: 4 mmol/L (ref 3.5–5.2)
Sodium: 137 mmol/L (ref 134–144)
eGFR: 70 mL/min/1.73 (ref >59)

## 2024-01-07 LAB — INSULIN, RANDOM: INSULIN: 24.9 u[IU]/mL (ref 2.6–24.9)

## 2024-01-07 LAB — VITAMIN D 25 HYDROXY (VIT D DEFICIENCY, FRACTURES): Vit D, 25-Hydroxy: 39 ng/mL (ref 30.0–100.0)

## 2024-01-07 NOTE — Telephone Encounter (Signed)
 Prior authorization denied for patients Ozempic . Patient needs to have type 2 diabetes with A1C greater than 6.5.

## 2024-01-09 ENCOUNTER — Ambulatory Visit: Payer: Self-pay | Admitting: Family Medicine

## 2024-01-09 NOTE — Addendum Note (Signed)
 Addended by: WAYLAN DARICE BRAVO on: 01/09/2024 03:50 PM   Modules accepted: Orders

## 2024-02-01 ENCOUNTER — Other Ambulatory Visit: Payer: Self-pay | Admitting: Family Medicine

## 2024-02-01 DIAGNOSIS — E88819 Insulin resistance, unspecified: Secondary | ICD-10-CM

## 2024-02-06 ENCOUNTER — Ambulatory Visit: Admitting: Family Medicine

## 2024-02-06 ENCOUNTER — Encounter: Payer: Self-pay | Admitting: Family Medicine

## 2024-02-06 VITALS — BP 132/83 | HR 66 | Temp 98.2°F | Ht 66.0 in | Wt 241.0 lb

## 2024-02-06 DIAGNOSIS — F5089 Other specified eating disorder: Secondary | ICD-10-CM

## 2024-02-06 DIAGNOSIS — E878 Other disorders of electrolyte and fluid balance, not elsewhere classified: Secondary | ICD-10-CM

## 2024-02-06 DIAGNOSIS — E88819 Insulin resistance, unspecified: Secondary | ICD-10-CM | POA: Diagnosis not present

## 2024-02-06 DIAGNOSIS — F3289 Other specified depressive episodes: Secondary | ICD-10-CM | POA: Diagnosis not present

## 2024-02-06 DIAGNOSIS — R232 Flushing: Secondary | ICD-10-CM

## 2024-02-06 DIAGNOSIS — G932 Benign intracranial hypertension: Secondary | ICD-10-CM

## 2024-02-06 DIAGNOSIS — Z6838 Body mass index (BMI) 38.0-38.9, adult: Secondary | ICD-10-CM

## 2024-02-06 DIAGNOSIS — E66812 Obesity, class 2: Secondary | ICD-10-CM

## 2024-02-06 DIAGNOSIS — E559 Vitamin D deficiency, unspecified: Secondary | ICD-10-CM

## 2024-02-06 NOTE — Progress Notes (Signed)
 Office: [phone]  /  Fax: [phone]  WEIGHT SUMMARY AND BIOMETRICS  Starting Date: 12/27/21  Starting Weight: 252lb   Weight Lost Since Last Visit: 0lb   Vitals Temp: 98.2 F (36.8 C) BP: 132/83 Pulse Rate: 66 SpO2: 99 %   Body Composition  Body Fat %: 47.7 % Fat Mass (lbs): 115 lbs Muscle Mass (lbs): 119.8 lbs Total Body Water (lbs): 89.6 lbs Visceral Fat Rating : 13    HPI  Chief Complaint: OBESITY  Zailee is here to discuss her progress with her obesity treatment plan. She is on the the Category 2 Plan and states she is following her eating plan approximately 40 % of the time. She states she is exercising 30-60 minutes 4 times per week.  Interval History:  Since last office visit she is up 5 lb She has had 2 falls since last visit She has a net weight loss of 11 lb in the past 2 years of medically supervised weight management This is a 4.3% total body weight loss Her insurance did not cover Ozempic  She is not tracking her calories nor doing any consistent exercise  Pharmacotherapy: Wellbutrin  SR 150 mg bid On Topiramate  300 mg daily per neurology  PHYSICAL EXAM:  Blood pressure 132/83, pulse 66, temperature 98.2 F (36.8 C), height 5' 6 (1.676 m), weight 241 lb (109.3 kg), last menstrual period 06/15/2019, SpO2 99%. Body mass index is 38.9 kg/m.  General: She is overweight, cooperative, alert, well developed, and in no acute distress. PSYCH: Has normal mood, affect and thought process.   Lungs: Normal breathing effort, no conversational dyspnea.  ASSESSMENT AND PLAN  TREATMENT PLAN FOR OBESITY:  Recommended Dietary Goals  Nalayah is currently in the action stage of change. As such, her goal is to continue weight management plan. She has agreed to keeping a food journal and adhering to recommended goals of 1400 calories and 90 to 100 g of protein and practicing portion control and making smarter food choices, such as increasing vegetables  and decreasing simple carbohydrates.  Behavioral Intervention  We discussed the following Behavioral Modification Strategies today: increasing lean protein intake to established goals, increasing fiber rich foods, increasing water intake , work on meal planning and preparation, keeping healthy foods at home, decreasing eating out or consumption of processed foods, and making healthy choices when eating convenient foods, practice mindfulness eating and understand the difference between hunger signals and cravings, work on managing stress, creating time for self-care and relaxation, and avoiding temptations and identifying enticing environmental cues.  Additional resources provided today: NA  Recommended Physical Activity Goals  Sallyann has been advised to work up to 150 minutes of moderate intensity aerobic activity a week and strengthening exercises 2-3 times per week for cardiovascular health, weight loss maintenance and preservation of muscle mass.   She has agreed to [employer] strengthening exercises with a goal of 2-3 sessions a week  and Start aerobic activity with a goal of 150 minutes a week at moderate intensity.   Pharmacotherapy changes for the treatment of obesity: None  ASSOCIATED CONDITIONS ADDRESSED TODAY  Low bicarbonate level Reviewed labs from last visit.  Her bicarb was low at 18 likely due to combination of topiramate  300 mg once daily and metformin .  Metformin  was discontinued 4 weeks ago.  Plan to recheck bicarb level at her follow-up visit in 2 months.  She is asymptomatic and will remain on topiramate  per neurology.  Other depression with emotional eating Mood has improved on Wellbutrin  SR  150 mg twice daily without adverse side effect We discussed potentially adding in CBT with Dr. Sharron.  She feels like she is no longer stress eating.  In addition, she is on Lexapro  10 mg once daily which has also help with anxiety.  She has a good support system at home.  Work on  improving sleep, stress reduction and keeping junk food trigger foods out of the house.  Class 2 severe obesity due to excess calories with serious comorbidity and body mass index (BMI) of 38.0 to 38.9 in adult  Insulin  resistance Worsening.  Reviewed labs from last visit.  Fasting insulin  was back up to 24.9.  This was while on metformin .  She has some associated carb and sugar cravings.  She has been inconsistent with dietary logging and regular exercise.  She is down 4.3% of her total body weight in the past 2 years and medically supervised weight management.  Will keep her off metformin  due to her drop in bicarb levels on her most recent labs.  Encouraged reading labels on food and drink for added sugar, working on starch reduction and increasing regular exercise frequency.  She did not qualify for use of a GLP-1 receptor agonist due to insurance.  Vitamin D  deficiency Last vitamin D  level 39 with a goal of 50 or more.  She reports compliant use of vitamin D  50,000 IU once weekly.  Continue vitamin D  once weekly prescription.  IIH (idiopathic intracranial hypertension) Managed by Dr. Darleen, she remains on topiramate  300 mg once daily.  Her headache frequency is unchanged.  This has been a factor in her ability to ramp up exercise.  Vasomotor flushing She notes hot flashes and night sweats and wonders if she is menopausal.  She is status post total abdominal hysterectomy with ovaries intact.  Plan to update an [facility] and LH level with next labs.     She was informed of the importance of frequent follow up visits to maximize her success with intensive lifestyle modifications for her multiple health conditions.   ATTESTASTION STATEMENTS:  Reviewed by clinician on day of visit: allergies, medications, problem list, medical history, surgical history, family history, social history, and previous encounter notes pertinent to obesity diagnosis.   I have personally spent 30 minutes total time  today in preparation, patient care, nutritional counseling and education,  and documentation for this visit, including the following: review of most recent clinical lab tests, prescribing medications/ refilling medications, reviewing medical assistant documentation, review and interpretation of bioimpedence results.     Darice Haddock, D.O. DABFM, DABOM [facility] [address] [place], [place] [address] [phone]

## 2024-02-06 NOTE — Patient Instructions (Signed)
 Begin working on [employer] plan/ workout routine

## 2024-03-19 ENCOUNTER — Encounter: Payer: Self-pay | Admitting: Gynecologic Oncology

## 2024-03-25 ENCOUNTER — Ambulatory Visit (INDEPENDENT_AMBULATORY_CARE_PROVIDER_SITE_OTHER): Admitting: Family Medicine

## 2024-03-25 ENCOUNTER — Encounter: Payer: Self-pay | Admitting: Family Medicine

## 2024-03-25 ENCOUNTER — Telehealth: Payer: Self-pay | Admitting: *Deleted

## 2024-03-25 VITALS — BP 128/86 | HR 71 | Temp 98.0°F | Ht 66.0 in | Wt 242.0 lb

## 2024-03-25 DIAGNOSIS — G932 Benign intracranial hypertension: Secondary | ICD-10-CM

## 2024-03-25 DIAGNOSIS — E88819 Insulin resistance, unspecified: Secondary | ICD-10-CM

## 2024-03-25 DIAGNOSIS — E66812 Obesity, class 2: Secondary | ICD-10-CM

## 2024-03-25 DIAGNOSIS — G4733 Obstructive sleep apnea (adult) (pediatric): Secondary | ICD-10-CM

## 2024-03-25 DIAGNOSIS — E559 Vitamin D deficiency, unspecified: Secondary | ICD-10-CM

## 2024-03-25 DIAGNOSIS — I10 Essential (primary) hypertension: Secondary | ICD-10-CM | POA: Diagnosis not present

## 2024-03-25 DIAGNOSIS — Z6839 Body mass index (BMI) 39.0-39.9, adult: Secondary | ICD-10-CM

## 2024-03-25 DIAGNOSIS — R5383 Other fatigue: Secondary | ICD-10-CM

## 2024-03-25 MED ORDER — LOSARTAN POTASSIUM 50 MG PO TABS: 50.0000 mg | ORAL_TABLET | Freq: Every day | ORAL | 0 refills | Status: DC

## 2024-03-25 MED ORDER — TIRZEPATIDE 2.5 MG/0.5ML ~~LOC~~ SOAJ: 2.5000 mg | SUBCUTANEOUS | 0 refills | Status: DC

## 2024-03-25 NOTE — Telephone Encounter (Signed)
Prior authorization done via cover my meds for patients Mounjaro. Waiting on determination.  

## 2024-03-25 NOTE — Progress Notes (Signed)
 Office: [phone]  /  Fax: [phone]  WEIGHT SUMMARY AND BIOMETRICS  Starting Date: 12/27/21  Starting Weight: 252lb   Weight Lost Since Last Visit: 0lb   Vitals Temp: 98 F (36.7 C) BP: 128/86 Pulse Rate: 71 SpO2: 97 %   Body Composition  Body Fat %: 47.8 % Fat Mass (lbs): 115.8 lbs Muscle Mass (lbs): 120 lbs Total Body Water (lbs): 89.4 lbs Visceral Fat Rating : 13    HPI  Chief Complaint: OBESITY  Amber Cobb is here to discuss her progress with her obesity treatment plan. She is on the the Category 2 Plan and states she is following her eating plan approximately 80 % of the time. She states she is exercising 30-60 minutes 3 times per week.  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 She has had 2 falls since her last visit with tripping (no dizziness) She is wearing a brace for ankle sprain Doing better with eating habits and husband is helping her to get protein in She has some sugar cravings since stopping metformin  She is bringing Kind bars to work along with yogurt drinks She brings water to work  She is walking up with headaches more often though is wearing her oral device   Pharmacotherapy: Wellbutrin  SR 150 mg bid  PHYSICAL EXAM:  Blood pressure 128/86, pulse 71, temperature 98 F (36.7 C), height 5' 6 (1.676 m), weight 242 lb (109.8 kg), last menstrual period 06/15/2019, SpO2 97%. Body mass index is 39.06 kg/m.  General: She is overweight, cooperative, alert, well developed, and in no acute distress. PSYCH: Has normal mood, affect and thought process.   Lungs: Normal breathing effort, no conversational dyspnea.   ASSESSMENT AND PLAN  TREATMENT PLAN FOR OBESITY:  Recommended Dietary Goals  [name] is currently in the action stage of change. As such, her goal is to continue weight  management plan. She has agreed to keeping a food journal and adhering to recommended goals of 1400 calories and 90 g of  protein and practicing portion control and making smarter food choices, such as increasing vegetables and decreasing simple carbohydrates.  Behavioral Intervention  We discussed the following Behavioral Modification Strategies today: increasing lean protein intake to established goals, increasing fiber rich foods, increasing water intake , work on meal planning and preparation, work on counselling psychologist calories using tracking application, keeping healthy foods at home, work on managing stress, creating time for self-care and relaxation, avoiding temptations and identifying enticing environmental cues, continue to work on implementation of reduced calorie nutritional plan, continue to practice mindfulness when eating, and planning for success.  Additional resources provided today: NA  Recommended Physical Activity Goals  [name] has been advised to work up to 150 minutes of moderate intensity aerobic activity a week and strengthening exercises 2-3 times per week for cardiovascular health, weight loss maintenance and preservation of muscle mass.   She has agreed to Think about enjoyable ways to increase daily physical activity and overcoming barriers to exercise and Increase physical activity in their day and reduce sedentary time (increase NEAT).  Pharmacotherapy changes for the treatment of obesity: begin Mounjaro 2.5 mg weekly for IIH, OSA and IR Patient denies a personal or family history of pancreatitis, medullary thyroid  carcinoma or multiple endocrine neoplasia type II. Recommend reviewing pen training video online.  ASSOCIATED CONDITIONS ADDRESSED TODAY  Insulin  resistance She is actively working on reducing starch and sugar intake with weight reduction Has not been able to exercise since falling x 2  Cravings are worse without metformin  (stopped due to low  bicarb in combination with topamax ).  Repeat labs today.  Look for improvements with Mounjaro use -     Tirzepatide ; Inject 2.5 mg into the skin once a week.  Dispense: 2 mL; Refill: 0 -     Insulin , random  Essential hypertension BP at goal Continue: -     Losartan  Potassium; Take 1 tablet (50 mg total) by mouth daily.  Dispense: 90 tablet; Refill: 0 -     Basic metabolic panel with GFR  IIH (idiopathic intracranial hypertension) Wearing oral device and plans to return to [facility] in new year Headaches have worsened recently Look for improvements with Mounjaro, weight loss and continue current meds per neuro -     Tirzepatide ; Inject 2.5 mg into the skin once a week.  Dispense: 2 mL; Refill: 0  OSA (obstructive sleep apnea) Continue use of oral device nightly Continue active plan for weight loss Improve sleep time to 8 hrs/ night -     Tirzepatide ; Inject 2.5 mg into the skin once a week.  Dispense: 2 mL; Refill: 0  Vitamin D  deficiency Currently finishing 3 mos RX of vitamin D  50,000 international units  weekly.  Energy level improving.   -     VITAMIN D  25 Hydroxy (Vit-D Deficiency, Fractures)  Other fatigue At risk for low B12 with use of metformin , stopped 2 mos ago -     Vitamin B12  Obesity, Class II, BMI 35-39.9 Stable She has lost < 5% TBW loss in 2 years of MSWM She has struggled to stay on recommended diet and exercise plan Her motivation to achieve a healthy weight remains Look for improvements with Mounjaro, otherwise she would be a good candidate to proceed with bariatric surgery.  This was discussed with patient today.  BMI 39.0-39.9,adult      She was informed of the importance of frequent follow up visits to maximize her success with intensive lifestyle modifications for her multiple health conditions.   ATTESTASTION STATEMENTS:  Reviewed by clinician on day of visit: allergies, medications, problem list, medical history, surgical history, family history,  social history, and previous encounter notes pertinent to obesity diagnosis.   I have personally spent 32 minutes total time today in preparation, patient care, nutritional counseling and education,  and documentation for this visit, including the following: review of most recent clinical lab tests, prescribing medications/ refilling medications, reviewing medical assistant documentation, review and interpretation of bioimpedence results.     Darice Haddock, D.O. DABFM, DABOM [facility] [address] [place], [place] [address] [phone]

## 2024-03-26 LAB — BASIC METABOLIC PANEL WITH GFR
BUN/Creatinine Ratio: 18 (ref 9–23)
BUN: 17 mg/dL (ref 6–24)
CO2: 21 mmol/L (ref 20–29)
Calcium: 9.2 mg/dL (ref 8.7–10.2)
Chloride: 105 mmol/L (ref 96–106)
Creatinine, Ser: 0.97 mg/dL (ref 0.57–1.00)
Glucose: 96 mg/dL (ref 70–99)
Potassium: 4.1 mmol/L (ref 3.5–5.2)
Sodium: 138 mmol/L (ref 134–144)
eGFR: 73 mL/min/1.73 (ref >59)

## 2024-03-26 LAB — INSULIN, RANDOM: INSULIN: 24.3 u[IU]/mL (ref 2.6–24.9)

## 2024-03-26 LAB — VITAMIN D 25 HYDROXY (VIT D DEFICIENCY, FRACTURES): Vit D, 25-Hydroxy: 35.9 ng/mL (ref 30.0–100.0)

## 2024-03-26 LAB — VITAMIN B12: Vitamin B-12: 585 pg/mL (ref 232–1245)

## 2024-03-26 NOTE — Telephone Encounter (Signed)
 PA for Pat has been denied. PA is now complete.     Message from Plan Request Reference Number: [id number]. MOUNJARO INJ 2.5/0.5 is denied for not meeting the prior authorization requirement(s). Details of this decision are in the notice attached below or have been faxed to you.

## 2024-03-30 ENCOUNTER — Ambulatory Visit: Payer: Self-pay | Admitting: Family Medicine

## 2024-03-30 MED ORDER — VITAMIN D (ERGOCALCIFEROL) 1.25 MG (50000 UNIT) PO CAPS
50000.0000 [IU] | ORAL_CAPSULE | ORAL | 0 refills | Status: AC
Start: 2024-03-30 — End: ?

## 2024-03-30 NOTE — Addendum Note (Signed)
 Addended by: WAYLAN DARICE BRAVO on: 03/30/2024 09:05 AM   Modules accepted: Orders

## 2024-04-20 NOTE — Assessment & Plan Note (Signed)
 Amber Cobb is a 48 y.o. woman with Stage IA1, grade 1 endometrioid endometrial adenocarcinoma, MSI stable, who presents for surveillance visit today.  Surgery 06/2019.   Patient is overall doing well and is NED on exam today.   From a cancer surveillance standpoint, we will continue with visits every 6 months per NCCN surveillance recommendations.  Given her early stage, low risk disease, we are alternating visits between my office and her OB/GYN.  I will plan to see her for surveillance visit in 1 year.  I reviewed with patient the symptoms that would be concerning for disease recurrence that should prompt a phone call before her next scheduled visit.

## 2024-04-20 NOTE — Progress Notes (Unsigned)
 Follow Up Note: Gyn-Onc  Amber Cobb 48 y.o. female  CC: Surveillance visit   HPI: The oncology history was reviewed.  Interval History: She denies any vaginal bleeding, abdominal/pelvic pain, cough, lethargy or increasing abdominal girth.   Review of Systems  Review of Systems  Constitutional:  Negative for malaise/fatigue and weight loss.  Respiratory:  Negative for shortness of breath and wheezing.   Cardiovascular:  Negative for chest pain and leg swelling.  Gastrointestinal:  Negative for abdominal pain, blood in stool, constipation, nausea and vomiting.  Genitourinary:  Negative for dysuria, frequency, hematuria and urgency.  Musculoskeletal:  Negative for joint pain and myalgias.  Neurological:  Negative for weakness.  Psychiatric/Behavioral:  Negative for depression. The patient does not have insomnia.    Current medications, allergy, social history, past surgical history, past medical history, family history were all reviewed.    Vitals:  Last menstrual period 06/15/2019.  Physical Exam:  Physical Exam Exam conducted with a chaperone present.  Constitutional:      General: She is not in acute distress. Cardiovascular:     Rate and Rhythm: Normal rate and regular rhythm.  Pulmonary:     Effort: Pulmonary effort is normal.     Breath sounds: Normal breath sounds. No wheezing or rhonchi.  Abdominal:     Palpations: Abdomen is soft.     Tenderness: There is no abdominal tenderness. There is no right CVA tenderness or left CVA tenderness.     Hernia: No hernia is present.  Genitourinary:    General: Normal vulva.     Urethra: No urethral lesion.     Vagina: No lesions. No bleeding Musculoskeletal:     Cervical back: Neck supple.     Right lower leg: No edema.     Left lower leg: No edema.  Lymphadenopathy:     Upper Body:     Right upper body: No supraclavicular adenopathy.     Left upper body: No supraclavicular adenopathy.     Lower Body: No right  inguinal adenopathy. No left inguinal adenopathy.  Skin:    Findings: No rash.  Neurological:     Mental Status: She is oriented to person, place, and time.   Assessment/Plan: ***   Olam Mill, MD

## 2024-04-22 ENCOUNTER — Ambulatory Visit: Admitting: Family Medicine

## 2024-04-22 ENCOUNTER — Inpatient Hospital Stay: Attending: Obstetrics & Gynecology | Admitting: Obstetrics & Gynecology

## 2024-04-22 VITALS — BP 131/68 | HR 68 | Temp 98.0°F | Resp 18 | Ht 66.0 in | Wt 247.4 lb

## 2024-04-22 DIAGNOSIS — Z8542 Personal history of malignant neoplasm of other parts of uterus: Secondary | ICD-10-CM | POA: Diagnosis not present

## 2024-04-22 DIAGNOSIS — C541 Malignant neoplasm of endometrium: Secondary | ICD-10-CM

## 2024-04-22 NOTE — Patient Instructions (Signed)
 Return as needed or in 3 months.

## 2024-04-23 ENCOUNTER — Encounter: Payer: Self-pay | Admitting: Obstetrics & Gynecology

## 2024-04-26 ENCOUNTER — Other Ambulatory Visit: Payer: Self-pay | Admitting: Family Medicine

## 2024-04-26 DIAGNOSIS — F3289 Other specified depressive episodes: Secondary | ICD-10-CM

## 2024-04-30 DIAGNOSIS — F3289 Other specified depressive episodes: Secondary | ICD-10-CM

## 2024-05-01 ENCOUNTER — Other Ambulatory Visit: Payer: Self-pay | Admitting: Family Medicine

## 2024-05-01 DIAGNOSIS — I10 Essential (primary) hypertension: Secondary | ICD-10-CM

## 2024-05-11 MED ORDER — ESCITALOPRAM OXALATE 10 MG PO TABS: 10.0000 mg | ORAL_TABLET | Freq: Every day | ORAL | 1 refills | Status: AC

## 2024-06-07 ENCOUNTER — Other Ambulatory Visit: Payer: Self-pay | Admitting: Family Medicine

## 2024-06-07 DIAGNOSIS — F3289 Other specified depressive episodes: Secondary | ICD-10-CM

## 2024-06-11 ENCOUNTER — Ambulatory Visit: Admitting: Family Medicine

## 2024-06-11 ENCOUNTER — Encounter: Payer: Self-pay | Admitting: Family Medicine

## 2024-06-11 VITALS — BP 129/84 | HR 70 | Ht 66.0 in | Wt 243.0 lb

## 2024-06-11 DIAGNOSIS — E88819 Insulin resistance, unspecified: Secondary | ICD-10-CM | POA: Diagnosis not present

## 2024-06-11 DIAGNOSIS — G932 Benign intracranial hypertension: Secondary | ICD-10-CM

## 2024-06-11 DIAGNOSIS — M722 Plantar fascial fibromatosis: Secondary | ICD-10-CM

## 2024-06-11 DIAGNOSIS — G4733 Obstructive sleep apnea (adult) (pediatric): Secondary | ICD-10-CM

## 2024-06-11 DIAGNOSIS — F5089 Other specified eating disorder: Secondary | ICD-10-CM

## 2024-06-11 DIAGNOSIS — F3289 Other specified depressive episodes: Secondary | ICD-10-CM

## 2024-06-11 DIAGNOSIS — Z6839 Body mass index (BMI) 39.0-39.9, adult: Secondary | ICD-10-CM

## 2024-06-11 DIAGNOSIS — E559 Vitamin D deficiency, unspecified: Secondary | ICD-10-CM

## 2024-06-11 DIAGNOSIS — I10 Essential (primary) hypertension: Secondary | ICD-10-CM | POA: Diagnosis not present

## 2024-06-11 MED ORDER — LOSARTAN POTASSIUM 50 MG PO TABS: 50.0000 mg | ORAL_TABLET | Freq: Every day | ORAL | 0 refills | Status: AC

## 2024-06-11 MED ORDER — BUPROPION HCL ER (SR) 150 MG PO TB12: 150.0000 mg | ORAL_TABLET | Freq: Two times a day (BID) | ORAL | 0 refills | Status: AC

## 2024-06-11 NOTE — Progress Notes (Signed)
 "  Office: [phone]  /  Fax: [phone]  WEIGHT SUMMARY AND BIOMETRICS  Starting Date: 12/27/21  Starting Weight: 252lb   Weight Lost Since Last Visit: 0lb   Vitals BP: 129/84 Pulse Rate: 70 SpO2: 99 %   Body Composition  Body Fat %: 48.3 % Fat Mass (lbs): 117.4 lbs Muscle Mass (lbs): 119.4 lbs Total Body Water (lbs): 89.8 lbs Visceral Fat Rating : 14    HPI  Chief Complaint: OBESITY  Brynlyn is here to discuss her progress with her obesity treatment plan. She is on the the Category 2 Plan and states she is following her eating plan approximately 85 % of the time. She states she is exercising 30-60 minutes 3-5 times per week.   Interval History:  Since last office visit she is up 1 lb This gives her a net weight loss of 9 lb in 2+ years of medically supervised weight management She did hit 250 lb in early Jan but still start an OTC supplement called 'glowing godess' for perimenopause a month ago which seems to be helping Her insurance did not cover Mounjaro  as prescribed last visit She has protein oatmeal on workdays and then brings leftovers or a lean cuisine for lunch and husband was cooking dinners.  Other nights, she orders food out or she cooks Stress eating have improved She has been lacking fruits and veggies but drinking water, some sweet tea She is more active at work but has not yet started formal exercise She plans to get a walking pad  Pharmacotherapy: None  PHYSICAL EXAM:  Blood pressure 129/84, pulse 70, height 5' 6 (1.676 m), weight 243 lb (110.2 kg), last menstrual period 06/15/2019, SpO2 99%. Body mass index is 39.22 kg/m.  General: She is overweight, cooperative, alert, well developed, and in no acute distress. PSYCH: Has normal mood, affect and thought process.   Lungs: Normal breathing effort, no conversational dyspnea.   ASSESSMENT AND PLAN  TREATMENT PLAN FOR OBESITY:  Recommended Dietary Goals  Cherice is currently in the  action stage of change. As such, her goal is to continue weight management plan. She has agreed to keeping a food journal and adhering to recommended goals of 1400 calories and 90 g of protein.  Behavioral Intervention  We discussed the following Behavioral Modification Strategies today: increasing lean protein intake to established goals, increasing fiber rich foods, increasing water intake , work on meal planning and preparation, work on counselling psychologist calories using tracking application, keeping healthy foods at home, work on managing stress, creating time for self-care and relaxation, avoiding temptations and identifying enticing environmental cues, continue to practice mindfulness when eating, and planning for success.  Additional resources provided today: NA  Recommended Physical Activity Goals  Rmani has been advised to work up to 150 minutes of moderate intensity aerobic activity a week and strengthening exercises 2-3 times per week for cardiovascular health, weight loss maintenance and preservation of muscle mass.   She has agreed to Start aerobic activity with a goal of 150 minutes a week at moderate intensity.  Walking goal set for 30 minutes daily  Pharmacotherapy changes for the treatment of obesity:   ASSOCIATED CONDITIONS ADDRESSED TODAY  Insulin  resistance Stable.  Reviewed labs from last visit.  Fasting insulin  elevated at 24.3, down from 24.9.  She remains off metformin  due to a rise in bicarb levels which have normalized without use of metformin .  She denies craving carbs or sugar.  Recommend tracking daily calorie intake and carefully  reading labels on products for added sugar avoiding products with over 8 g of added sugar per serving.  Work on increasing walking time out of her job is more active.  Essential hypertension -     Losartan  Potassium; Take 1 tablet (50 mg total) by mouth daily.  Dispense: 90 tablet; Refill: 0 Blood pressure is well-controlled  currently on losartan  50 mg once daily.  This may be moved over to her PCP for future refills.  Other depression with emotional eating -     buPROPion  HCl ER (SR); Take 1 tablet (150 mg total) by mouth 2 (two) times daily.  Dispense: 180 tablet; Refill: 0 Mood stable on bupropion  SR 150 mg twice daily and Lexapro  10 mg once daily.  These may be moved over to her PCP for future refills.  She declined the need for counseling.  She has a good support system at home.  Morbid obesity (HCC) Stable.  Unfortunately, her insurance does not cover antiobesity medication though she would be a Resume a GLP-1 receptor agonist.  Given her IIH, consider the role of bariatric surgery.  She was also offered visits with a registered dietitian or behavioral therapist which she declined today.  BMI 39.0-39.9,adult  IIH (idiopathic intracranial hypertension) Headaches are stable and managed by Dr. Darleen currently on Topamax  300 mg once daily. Continue active plan for weight loss  Vitamin D  deficiency Last vitamin D  Lab Results  Component Value Date   VD25OH 35.9 03/25/2024  Stable.  Recommend continuing vitamin D  OTC 2000 IU once daily and a women's multivitamin daily.  OSA (obstructive sleep apnea) Using an oral appliance for OSA.  Scheduled for follow-up with sleep medicine soon.  Plantar fasciitis Worsened.  Bilateral foot plantar fasciitis has affected her ability to walk more for exercise.  She was shown Stretches and tennis ball massage for treatment.  Recommend wearing proper sneakers to work for arch support and avoid barefoot walking at home.     She was informed of the importance of frequent follow up visits to maximize her success with intensive lifestyle modifications for her multiple health conditions.   ATTESTASTION STATEMENTS:  Reviewed by clinician on day of visit: allergies, medications, problem list, medical history, surgical history, family history, social history, and previous  encounter notes pertinent to obesity diagnosis.   I have personally spent 36 minutes total time today in preparation, patient care, nutritional counseling and education,  and documentation for this visit, including the following: review of most recent clinical lab tests, prescribing medications/ refilling medications, reviewing medical assistant documentation, review and interpretation of bioimpedence results.     Darice Haddock, D.O. DABFM, DABOM [facility] [address] [place], [place] [address] [phone] "

## 2024-06-11 NOTE — Patient Instructions (Signed)
 [employer] pay options for Tirzepatide   Ramp up walking time to 30 min/ day Begin calf stretches/ tennis ball massage for plantar fasciitis  Track calorie intake daily with a goal of 1400 per day This should include 90 g of protein daily Remember your fruits and veggies Keep sweets out of the house Limit meals out to 2 per week

## 2024-08-06 ENCOUNTER — Ambulatory Visit: Admitting: Family Medicine

## 2024-08-07 ENCOUNTER — Inpatient Hospital Stay: Admitting: Gynecologic Oncology
